# Patient Record
Sex: Male | Born: 1940
Health system: Southern US, Community
[De-identification: ages and names within clinical notes are randomized; demographics above are authoritative.]

## PROBLEM LIST (undated history)

## (undated) DIAGNOSIS — F410 Panic disorder [episodic paroxysmal anxiety] without agoraphobia: Secondary | ICD-10-CM

## (undated) DIAGNOSIS — N400 Enlarged prostate without lower urinary tract symptoms: Secondary | ICD-10-CM

## (undated) DIAGNOSIS — M109 Gout, unspecified: Secondary | ICD-10-CM

## (undated) DIAGNOSIS — G4733 Obstructive sleep apnea (adult) (pediatric): Secondary | ICD-10-CM

## (undated) DIAGNOSIS — G4700 Insomnia, unspecified: Secondary | ICD-10-CM

## (undated) DIAGNOSIS — I10 Essential (primary) hypertension: Secondary | ICD-10-CM

## (undated) DIAGNOSIS — E785 Hyperlipidemia, unspecified: Secondary | ICD-10-CM

## (undated) HISTORY — DX: Insomnia, unspecified: G47.00

## (undated) HISTORY — DX: Hyperlipidemia, unspecified: E78.5

## (undated) HISTORY — DX: Obstructive sleep apnea (adult) (pediatric): G47.33

## (undated) HISTORY — PX: BACK SURGERY: SHX140

## (undated) HISTORY — PX: EYE SURGERY: SHX253

## (undated) HISTORY — DX: Essential (primary) hypertension: I10

## (undated) HISTORY — DX: Benign prostatic hyperplasia without lower urinary tract symptoms: N40.0

## (undated) HISTORY — DX: Panic disorder (episodic paroxysmal anxiety): F41.0

## (undated) HISTORY — DX: Gout, unspecified: M10.9

---

## 1997-11-20 ENCOUNTER — Encounter: Admission: RE | Admit: 1997-11-20 | Discharge: 1997-11-20 | Payer: Self-pay | Admitting: Family Medicine

## 1997-11-24 ENCOUNTER — Encounter: Admission: RE | Admit: 1997-11-24 | Discharge: 1997-11-24 | Payer: Self-pay | Admitting: Family Medicine

## 2000-05-26 ENCOUNTER — Encounter: Admission: RE | Admit: 2000-05-26 | Discharge: 2000-05-26 | Payer: Self-pay | Admitting: Otolaryngology

## 2000-05-26 ENCOUNTER — Encounter: Payer: Self-pay | Admitting: Otolaryngology

## 2000-11-30 ENCOUNTER — Encounter: Admission: RE | Admit: 2000-11-30 | Discharge: 2000-11-30 | Payer: Self-pay | Admitting: Family Medicine

## 2000-11-30 ENCOUNTER — Encounter: Admission: RE | Admit: 2000-11-30 | Discharge: 2000-11-30 | Payer: Self-pay | Admitting: Sports Medicine

## 2000-11-30 ENCOUNTER — Encounter: Payer: Self-pay | Admitting: Sports Medicine

## 2000-12-12 ENCOUNTER — Encounter: Admission: RE | Admit: 2000-12-12 | Discharge: 2000-12-12 | Payer: Self-pay | Admitting: Family Medicine

## 2001-01-22 ENCOUNTER — Emergency Department (HOSPITAL_COMMUNITY): Admission: EM | Admit: 2001-01-22 | Discharge: 2001-01-22 | Payer: Self-pay

## 2001-01-25 ENCOUNTER — Encounter: Admission: RE | Admit: 2001-01-25 | Discharge: 2001-01-25 | Payer: Self-pay | Admitting: Family Medicine

## 2001-01-25 ENCOUNTER — Encounter: Payer: Self-pay | Admitting: Sports Medicine

## 2001-01-25 ENCOUNTER — Encounter: Admission: RE | Admit: 2001-01-25 | Discharge: 2001-01-25 | Payer: Self-pay | Admitting: Sports Medicine

## 2001-01-29 ENCOUNTER — Encounter: Admission: RE | Admit: 2001-01-29 | Discharge: 2001-01-29 | Payer: Self-pay | Admitting: Sports Medicine

## 2001-12-10 ENCOUNTER — Encounter: Admission: RE | Admit: 2001-12-10 | Discharge: 2001-12-10 | Payer: Self-pay | Admitting: Family Medicine

## 2001-12-13 ENCOUNTER — Encounter: Admission: RE | Admit: 2001-12-13 | Discharge: 2001-12-13 | Payer: Self-pay | Admitting: Sports Medicine

## 2002-01-15 ENCOUNTER — Ambulatory Visit (HOSPITAL_COMMUNITY): Admission: RE | Admit: 2002-01-15 | Discharge: 2002-01-15 | Payer: Self-pay | Admitting: Sports Medicine

## 2002-06-05 ENCOUNTER — Encounter: Admission: RE | Admit: 2002-06-05 | Discharge: 2002-06-05 | Payer: Self-pay | Admitting: Sports Medicine

## 2002-06-06 ENCOUNTER — Encounter: Admission: RE | Admit: 2002-06-06 | Discharge: 2002-06-06 | Payer: Self-pay | Admitting: Sports Medicine

## 2003-01-22 ENCOUNTER — Encounter: Admission: RE | Admit: 2003-01-22 | Discharge: 2003-01-22 | Payer: Self-pay | Admitting: Family Medicine

## 2003-01-23 ENCOUNTER — Encounter: Admission: RE | Admit: 2003-01-23 | Discharge: 2003-01-23 | Payer: Self-pay | Admitting: Sports Medicine

## 2003-06-27 ENCOUNTER — Encounter: Admission: RE | Admit: 2003-06-27 | Discharge: 2003-06-27 | Payer: Self-pay | Admitting: Family Medicine

## 2004-06-03 ENCOUNTER — Ambulatory Visit: Payer: Self-pay | Admitting: Family Medicine

## 2004-06-10 ENCOUNTER — Ambulatory Visit: Payer: Self-pay | Admitting: Sports Medicine

## 2004-07-05 ENCOUNTER — Ambulatory Visit: Payer: Self-pay | Admitting: Sports Medicine

## 2004-08-30 ENCOUNTER — Ambulatory Visit: Payer: Self-pay | Admitting: Family Medicine

## 2005-05-26 ENCOUNTER — Ambulatory Visit: Payer: Self-pay | Admitting: Sports Medicine

## 2005-06-09 ENCOUNTER — Ambulatory Visit: Payer: Self-pay | Admitting: Sports Medicine

## 2006-06-02 ENCOUNTER — Ambulatory Visit: Payer: Self-pay | Admitting: Family Medicine

## 2006-06-08 ENCOUNTER — Ambulatory Visit: Payer: Self-pay | Admitting: Sports Medicine

## 2006-07-25 ENCOUNTER — Ambulatory Visit (HOSPITAL_COMMUNITY): Admission: RE | Admit: 2006-07-25 | Discharge: 2006-07-25 | Payer: Self-pay | Admitting: Sports Medicine

## 2006-07-25 ENCOUNTER — Ambulatory Visit: Payer: Self-pay | Admitting: Sports Medicine

## 2006-10-12 DIAGNOSIS — N400 Enlarged prostate without lower urinary tract symptoms: Secondary | ICD-10-CM | POA: Insufficient documentation

## 2006-10-12 DIAGNOSIS — E785 Hyperlipidemia, unspecified: Secondary | ICD-10-CM | POA: Insufficient documentation

## 2006-10-12 DIAGNOSIS — I10 Essential (primary) hypertension: Secondary | ICD-10-CM | POA: Insufficient documentation

## 2007-02-26 ENCOUNTER — Telehealth: Payer: Self-pay | Admitting: *Deleted

## 2007-02-28 ENCOUNTER — Encounter: Admission: RE | Admit: 2007-02-28 | Discharge: 2007-02-28 | Payer: Self-pay | Admitting: Sports Medicine

## 2007-06-18 ENCOUNTER — Telehealth: Payer: Self-pay | Admitting: *Deleted

## 2007-07-05 ENCOUNTER — Encounter: Payer: Self-pay | Admitting: Sports Medicine

## 2007-07-05 ENCOUNTER — Ambulatory Visit: Payer: Self-pay | Admitting: Family Medicine

## 2007-07-05 DIAGNOSIS — N400 Enlarged prostate without lower urinary tract symptoms: Secondary | ICD-10-CM

## 2007-07-05 LAB — CONVERTED CEMR LAB

## 2007-07-07 LAB — CONVERTED CEMR LAB
ALT: 16 units/L (ref 0–53)
AST: 15 units/L (ref 0–37)
Albumin: 4.3 g/dL (ref 3.5–5.2)
Alkaline Phosphatase: 77 units/L (ref 39–117)
BUN: 14 mg/dL (ref 6–23)
CO2: 25 meq/L (ref 19–32)
Calcium: 9.1 mg/dL (ref 8.4–10.5)
Chloride: 103 meq/L (ref 96–112)
Cholesterol: 185 mg/dL (ref 0–200)
Creatinine, Ser: 1.06 mg/dL (ref 0.40–1.50)
Glucose, Bld: 94 mg/dL (ref 70–99)
HCT: 47.8 % (ref 39.0–52.0)
HDL: 31 mg/dL — ABNORMAL LOW (ref >39)
Hemoglobin: 16.4 g/dL (ref 13.0–17.0)
LDL Cholesterol: 97 mg/dL (ref 0–99)
MCHC: 34.3 g/dL (ref 30.0–36.0)
MCV: 90.7 fL (ref 78.0–100.0)
PSA: 0.92 ng/mL (ref 0.10–4.00)
Platelets: 166 10*3/uL (ref 150–400)
Potassium: 4.6 meq/L (ref 3.5–5.3)
RBC: 5.27 M/uL (ref 4.22–5.81)
RDW: 13 % (ref 11.5–15.5)
Sodium: 140 meq/L (ref 135–145)
Total Bilirubin: 0.7 mg/dL (ref 0.3–1.2)
Total CHOL/HDL Ratio: 6
Total Protein: 6.9 g/dL (ref 6.0–8.3)
Triglycerides: 286 mg/dL — ABNORMAL HIGH (ref <150)
VLDL: 57 mg/dL — ABNORMAL HIGH (ref 0–40)
WBC: 6 10*3/uL (ref 4.0–10.5)

## 2007-07-19 ENCOUNTER — Encounter: Payer: Self-pay | Admitting: *Deleted

## 2007-07-19 ENCOUNTER — Ambulatory Visit: Payer: Self-pay | Admitting: Sports Medicine

## 2007-07-19 DIAGNOSIS — J209 Acute bronchitis, unspecified: Secondary | ICD-10-CM | POA: Insufficient documentation

## 2007-07-19 DIAGNOSIS — K589 Irritable bowel syndrome without diarrhea: Secondary | ICD-10-CM | POA: Insufficient documentation

## 2007-08-23 ENCOUNTER — Ambulatory Visit: Payer: Self-pay | Admitting: Internal Medicine

## 2007-08-31 ENCOUNTER — Encounter: Payer: Self-pay | Admitting: Sports Medicine

## 2007-08-31 ENCOUNTER — Ambulatory Visit: Payer: Self-pay | Admitting: Internal Medicine

## 2007-08-31 ENCOUNTER — Encounter: Payer: Self-pay | Admitting: Internal Medicine

## 2007-10-10 ENCOUNTER — Ambulatory Visit: Payer: Self-pay | Admitting: Internal Medicine

## 2007-10-10 LAB — CONVERTED CEMR LAB
Sed Rate: 6 mm/hr (ref 0–20)
TSH: 1.84 microintl units/mL (ref 0.35–5.50)

## 2008-02-19 ENCOUNTER — Encounter: Payer: Self-pay | Admitting: Sports Medicine

## 2008-03-26 ENCOUNTER — Encounter: Payer: Self-pay | Admitting: Sports Medicine

## 2008-05-22 ENCOUNTER — Encounter: Payer: Self-pay | Admitting: Family Medicine

## 2008-05-29 ENCOUNTER — Encounter: Payer: Self-pay | Admitting: Family Medicine

## 2008-05-29 ENCOUNTER — Ambulatory Visit: Payer: Self-pay | Admitting: Family Medicine

## 2008-05-30 ENCOUNTER — Telehealth: Payer: Self-pay | Admitting: Family Medicine

## 2008-06-13 ENCOUNTER — Ambulatory Visit: Payer: Self-pay | Admitting: Family Medicine

## 2008-06-13 DIAGNOSIS — G4700 Insomnia, unspecified: Secondary | ICD-10-CM | POA: Insufficient documentation

## 2008-09-02 ENCOUNTER — Telehealth: Payer: Self-pay | Admitting: *Deleted

## 2008-09-03 ENCOUNTER — Ambulatory Visit: Payer: Self-pay | Admitting: Family Medicine

## 2008-09-03 ENCOUNTER — Encounter: Payer: Self-pay | Admitting: Family Medicine

## 2008-09-03 DIAGNOSIS — M79609 Pain in unspecified limb: Secondary | ICD-10-CM

## 2008-09-05 LAB — CONVERTED CEMR LAB: Uric Acid, Serum: 8.3 mg/dL — ABNORMAL HIGH (ref 4.0–7.8)

## 2008-10-28 ENCOUNTER — Ambulatory Visit (HOSPITAL_COMMUNITY): Admission: RE | Admit: 2008-10-28 | Discharge: 2008-10-28 | Payer: Self-pay | Admitting: Family Medicine

## 2009-02-11 ENCOUNTER — Encounter: Payer: Self-pay | Admitting: Family Medicine

## 2009-04-14 ENCOUNTER — Ambulatory Visit: Payer: Self-pay | Admitting: Cardiology

## 2009-04-14 DIAGNOSIS — E783 Hyperchylomicronemia: Secondary | ICD-10-CM

## 2009-04-14 DIAGNOSIS — R0602 Shortness of breath: Secondary | ICD-10-CM

## 2009-04-24 ENCOUNTER — Encounter: Payer: Self-pay | Admitting: Cardiology

## 2009-04-24 ENCOUNTER — Ambulatory Visit: Payer: Self-pay

## 2009-06-01 ENCOUNTER — Encounter: Payer: Self-pay | Admitting: Pulmonary Disease

## 2009-07-29 ENCOUNTER — Telehealth: Payer: Self-pay | Admitting: Pulmonary Disease

## 2009-07-29 ENCOUNTER — Ambulatory Visit: Payer: Self-pay | Admitting: Pulmonary Disease

## 2009-07-29 DIAGNOSIS — G4730 Sleep apnea, unspecified: Secondary | ICD-10-CM | POA: Insufficient documentation

## 2009-07-31 ENCOUNTER — Ambulatory Visit: Payer: Self-pay | Admitting: Pulmonary Disease

## 2009-07-31 LAB — CONVERTED CEMR LAB
Chloride: 102 meq/L (ref 96–112)
GFR calc non Af Amer: 88.89 mL/min (ref >60)
Potassium: 4.6 meq/L (ref 3.5–5.1)
Sodium: 139 meq/L (ref 135–145)

## 2009-08-06 ENCOUNTER — Ambulatory Visit: Payer: Self-pay | Admitting: Cardiology

## 2009-08-06 ENCOUNTER — Telehealth: Payer: Self-pay | Admitting: Pulmonary Disease

## 2010-12-07 ENCOUNTER — Encounter: Payer: Self-pay | Admitting: *Deleted

## 2010-12-28 NOTE — Assessment & Plan Note (Signed)
[facility]                         GASTROENTEROLOGY OFFICE NOTE   NAME:Saar, FERRIS FIELDEN                     MRN:          [id number]  DATE:10/10/2007                            DOB:          01/03/1941    NEW PATIENT EVALUATION:  Mr. Feasel is a 70 year old gentleman, a  patient of Dr. Darrick Penna.  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  He was referred by Dr. Darrick Penna initially for screening  colonoscopy, which was done on August 31, 2007, and showed two  diminutive polyps of the left colon and mild diverticulosis of the left  colon.  After the procedure, his wife's mentioned to me symptoms that  have bothered Mr. Plitt for the past ten years, mostly diarrhea and  urgency.  The attacks of crampy epigastric abdominal pain, which results  in three to four loose bowel movements, occur two to three times a week.  It has kept him out of going out, sometimes interferes with his work as  a Teacher, early years/pre in [place].  He retired as a Teacher, early years/pre, full-  time, two years ago, but his diarrhea and GI problems did not subside.  His eating habits are regular.  He usually skips breakfast, eats lunch  at a restaurant and supper at home.  His weight has gradually crept up  to being about 15 pounds up to 20 pounds overweight.  He is never  constipated.  He denies any rectal bleeding and on colonoscopy, there  was no evidence of colitis.  There is no family history of colon cancer.   MEDICATIONS:  1. Lisinopril 10 mg p.o. daily.  2. Aspirin 81 mg p.o. daily.   PAST HISTORY:  Significant for high blood pressure, hyperlipidemia,  kidney stones.  Back surgery 13 years ago.   FAMILY HISTORY:  Positive for heart disease in his father.   SOCIAL HISTORY:  Married with two children, works as a Teacher, early years/pre.  He  does not smoke and never drinks alcohol.   REVIEW OF SYSTEMS:  Positive for recent bronchitis, eyeglasses, changes  of urination with increased  frequency, some sleeping problems.   PHYSICAL EXAM:  Blood pressure 140/90, pulse 82 and weight 199 pounds.  He was alert, oriented, in no distress.  Sclera was anicteric.  Face was slightly flushed.  NECK:  Supple.  Carotids were clear.  LUNGS:  Clear to auscultation.  COR:  Normal S1, normal S2.  ABDOMEN:  Soft, relaxed with normoactive bowel sounds.  There were no  abnormal rushes, no tenderness.  Liver edge at costal margin.  Lower  abdomen was normal.  RECTAL EXAM:  Not done since patient had a recent colonoscopy.  EXTREMITIES:  No edema.   IMPRESSION:  Patient is a 70 year old white male with chronic  intermittent diarrhea, without alternating constipation.  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  There are no signs of  failure to thrive, such as weight-loss, bleeding or pain.  His symptoms  have been stable, though somewhat accelerated in the last two years.  He  denies excessive stress and his eating habits are reasonably good.  The  only consideration, other than irritable bowel syndrome, would be celiac  sprue or possible hyperthyroidism, although he might have had his TSH  checked already by Dr. Darrick Penna.  Another very unusual diagnosis would be  carcinoid or some basal active peptide disease.   PLAN:  I have discussed these diagnostic differential with Mr. Esco.  I think I would first rule out metabolic causes of his diarrhea and, at  the same time, start him on IBS therapy.  If the symptoms deteriorate or  if the tests are abnormal, we will proceed with further evaluation, such  as small bowel follow-through, possibly 24-hour urine for HIAA.   PLAN:  1. TSH and tissue transglutaminase levels today.  2. Begin Levbid 0.375 mg on the morning of his work days.  May      increase it to two a day.  3. Levsin sublingually 0.125 mg p.r.n., only this could be used in      conjunction with long-acting Levbid.  4. Begin Benefiber 3.5 g daily.    Patient will let us know about the effectiveness of this regimen.     Hedwig Morton. Juanda Chance, MD  Electronically Signed    DMB/MedQ  DD: 10/10/2007  DT: 10/10/2007  Job #: [id number]   cc:   Sibyl Parr. Darrick Penna, M.D.

## 2011-02-28 ENCOUNTER — Encounter: Payer: Self-pay | Admitting: Family Medicine

## 2011-02-28 DIAGNOSIS — I10 Essential (primary) hypertension: Secondary | ICD-10-CM | POA: Insufficient documentation

## 2011-06-03 DIAGNOSIS — H35379 Puckering of macula, unspecified eye: Secondary | ICD-10-CM | POA: Insufficient documentation

## 2011-06-03 DIAGNOSIS — H4720 Unspecified optic atrophy: Secondary | ICD-10-CM | POA: Insufficient documentation

## 2011-06-03 DIAGNOSIS — H5340 Unspecified visual field defects: Secondary | ICD-10-CM | POA: Insufficient documentation

## 2011-07-28 DIAGNOSIS — H2511 Age-related nuclear cataract, right eye: Secondary | ICD-10-CM | POA: Insufficient documentation

## 2011-07-28 DIAGNOSIS — B309 Viral conjunctivitis, unspecified: Secondary | ICD-10-CM | POA: Insufficient documentation

## 2011-07-28 DIAGNOSIS — Z961 Presence of intraocular lens: Secondary | ICD-10-CM | POA: Insufficient documentation

## 2011-07-28 DIAGNOSIS — H35372 Puckering of macula, left eye: Secondary | ICD-10-CM | POA: Insufficient documentation

## 2012-08-22 ENCOUNTER — Encounter: Payer: Self-pay | Admitting: Internal Medicine

## 2012-12-13 ENCOUNTER — Other Ambulatory Visit (INDEPENDENT_AMBULATORY_CARE_PROVIDER_SITE_OTHER): Payer: Medicare Other

## 2012-12-13 DIAGNOSIS — M109 Gout, unspecified: Secondary | ICD-10-CM

## 2012-12-13 DIAGNOSIS — Z0000 Encounter for general adult medical examination without abnormal findings: Secondary | ICD-10-CM

## 2012-12-27 ENCOUNTER — Other Ambulatory Visit: Payer: Self-pay | Admitting: Family Medicine

## 2012-12-27 MED ORDER — ALLOPURINOL 100 MG PO TABS: ORAL_TABLET | ORAL | Status: DC

## 2012-12-27 NOTE — Telephone Encounter (Signed)
Rx Refilled  

## 2013-04-10 ENCOUNTER — Encounter: Payer: Self-pay | Admitting: Internal Medicine

## 2013-07-26 ENCOUNTER — Telehealth: Payer: Self-pay | Admitting: *Deleted

## 2013-07-26 MED ORDER — ALLOPURINOL 100 MG PO TABS: ORAL_TABLET | ORAL | Status: DC

## 2013-07-26 NOTE — Telephone Encounter (Signed)
Meds refilled.

## 2013-08-05 ENCOUNTER — Encounter: Payer: Self-pay | Admitting: Physician Assistant

## 2013-08-05 ENCOUNTER — Ambulatory Visit
Admission: RE | Admit: 2013-08-05 | Discharge: 2013-08-05 | Disposition: A | Discharge status: Home / Self Care | Payer: Medicare Other | Source: Ambulatory Visit | Attending: Physician Assistant | Admitting: Physician Assistant

## 2013-08-05 ENCOUNTER — Ambulatory Visit (INDEPENDENT_AMBULATORY_CARE_PROVIDER_SITE_OTHER): Payer: Medicare Other | Admitting: Physician Assistant

## 2013-08-05 VITALS — BP 130/86 | HR 112 | Temp 102.4°F | Resp 20 | Wt 194.0 lb

## 2013-08-05 DIAGNOSIS — J22 Unspecified acute lower respiratory infection: Secondary | ICD-10-CM

## 2013-08-05 DIAGNOSIS — J988 Other specified respiratory disorders: Secondary | ICD-10-CM

## 2013-08-05 MED ORDER — HYDROCOD POLST-CHLORPHEN POLST 10-8 MG/5ML PO LQCR: 5.0000 mL | Freq: Two times a day (BID) | ORAL | Status: DC | PRN

## 2013-08-05 MED ORDER — LEVOFLOXACIN 750 MG PO TABS: 750.0000 mg | ORAL_TABLET | Freq: Every day | ORAL | Status: DC

## 2013-08-06 NOTE — Progress Notes (Signed)
Patient ID: John Evans MRN: [id number], DOB: August 01, 1941, 72 y.o. Date of Encounter: 08/06/2013, 2:36 PM    Chief Complaint:  Chief Complaint  Patient presents with  . fever, barky cough    has had long time  work gave him Zpak and he improved  now has gotten much worse.     HPI: 72 y.o. year old white male is here with his wife. 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 His son runs [employer]. He reports that his symptoms started 10 days ago. He was treated with a Z-Pak which he completed 3 days ago. Since completing the Z-Pak, he says that he is a lot worse. He and his wife both report that he currently is at his  worst now--compared to how he has been over the past 10 days. He has a really deep cough. States that he never smoked. Been unaware of any fevers or chills. However his temperature now at the office is 102.4.   He has had minimal head and nasal congestion/mucus. No significant sore throat and no ear ache.     Home Meds: See attached medication section for any medications that were entered at today's visit. The computer does not put those onto this list.The following list is a list of meds entered prior to today's visit.   Current Outpatient Prescriptions on File Prior to Visit  Medication Sig Dispense Refill  . allopurinol (ZYLOPRIM) 100 MG tablet 2 tab po qd  60 tablet  0  . aspirin 81 MG tablet Take 81 mg by mouth daily.        . calcium-vitamin D (OSCAL) 250-125 MG-UNIT per tablet Take 1 tablet by mouth daily.        Marland Kitchen lisinopril (PRINIVIL,ZESTRIL) 10 MG tablet Take 10 mg by mouth daily.        . bromfenac (XIBROM) 0.09 % ophthalmic solution 1 drop two times a day each eye       . dutasteride (AVODART) 0.5 MG capsule Take 0.5 mg by mouth daily.        . fish oil-omega-3 fatty acids 1000 MG capsule Take 2 g by mouth daily.        . hydrochlorothiazide (,MICROZIDE/HYDRODIURIL,) 12.5 MG capsule Take 12.5 mg by mouth  daily.        Marland Kitchen zolpidem (AMBIEN CR) 12.5 MG CR tablet Take 12.5 mg by mouth at bedtime as needed.         No current facility-administered medications on file prior to visit.    Allergies:  Allergies  Allergen Reactions  . Latex       Review of Systems: See HPI for pertinent ROS. All other ROS negative.    Physical Exam: Blood pressure 130/86, pulse 112, temperature 102.4 F (39.1 C), temperature source Oral, resp. rate 20, weight 194 lb (87.998 kg)., Body mass index is 27.07 kg/(m^2). General:  WNWD WM. Appears in no acute distress. HEENT: Normocephalic, atraumatic, eyes without discharge, sclera non-icteric, nares are without discharge. Bilateral auditory canals clear, TM's are without perforation, pearly grey and translucent with reflective cone of light bilaterally. Oral cavity moist, posterior pharynx without exudate, erythema, peritonsillar abscess.  Neck: Supple. No thyromegaly. No lymphadenopathy. Lungs: Clear bilaterally to auscultation without wheezes, rales, or rhonchi. Breathing is unlabored. Heart: Regular rhythm. No murmurs, rubs, or gallops. Msk:  Strength and tone normal for age. Extremities/Skin: Warm and dry. No clubbing or cyanosis. No edema. No  rashes or suspicious lesions. Neuro: Alert and oriented X 3. Moves all extremities spontaneously. Gait is normal. CNII-XII grossly in tact. Psych:  Responds to questions appropriately with a normal affect.     ASSESSMENT AND PLAN:  72 y.o. year old male with  1. Lower respiratory tract infection Given his fever of 102.4 and the fact that he has only taken a Z-Pak, we'll go ahead and obtain x-ray. However, on exam his lungs sound clear. Will go Ahead and treat with Levaquin. He is requesting an antitussive. Will give Tussionex so he can get some sleep and rest from cough. I recommended he use Mucinex DM during the day as an expectorant. Follow up if symptoms do not resolve after completion of Levaquin. As well, we'll go  ahead and followup with him once we get the x-ray report. - levofloxacin (LEVAQUIN) 750 MG tablet; Take 1 tablet (750 mg total) by mouth daily.  Dispense: 10 tablet; Refill: 0 - DG Chest 2 View; Future - chlorpheniramine-HYDROcodone (TUSSIONEX) 10-8 MG/5ML LQCR; Take 5 mLs by mouth every 12 (twelve) hours as needed for cough.  Dispense: 115 mL; Refill: 0   Signed, [address] [place], [place], [facility] 08/06/2013 2:36 PM

## 2013-09-02 ENCOUNTER — Other Ambulatory Visit: Payer: Medicare Other

## 2013-09-02 DIAGNOSIS — Z125 Encounter for screening for malignant neoplasm of prostate: Secondary | ICD-10-CM

## 2013-09-02 DIAGNOSIS — E790 Hyperuricemia without signs of inflammatory arthritis and tophaceous disease: Secondary | ICD-10-CM

## 2013-09-02 DIAGNOSIS — Z79899 Other long term (current) drug therapy: Secondary | ICD-10-CM

## 2013-09-02 DIAGNOSIS — E785 Hyperlipidemia, unspecified: Secondary | ICD-10-CM

## 2013-09-02 LAB — CBC WITH DIFFERENTIAL/PLATELET
BASOS ABS: 0 10*3/uL (ref 0.0–0.1)
Basophils Relative: 1 % (ref 0–1)
EOS ABS: 0.2 10*3/uL (ref 0.0–0.7)
Eosinophils Relative: 3 % (ref 0–5)
HCT: 46.6 % (ref 39.0–52.0)
Hemoglobin: 16.4 g/dL (ref 13.0–17.0)
LYMPHS ABS: 1.8 10*3/uL (ref 0.7–4.0)
LYMPHS PCT: 30 % (ref 12–46)
MCH: 31.9 pg (ref 26.0–34.0)
MCHC: 35.2 g/dL (ref 30.0–36.0)
MCV: 90.7 fL (ref 78.0–100.0)
Monocytes Absolute: 0.6 10*3/uL (ref 0.1–1.0)
Monocytes Relative: 10 % (ref 3–12)
NEUTROS PCT: 56 % (ref 43–77)
Neutro Abs: 3.3 10*3/uL (ref 1.7–7.7)
PLATELETS: 180 10*3/uL (ref 150–400)
RBC: 5.14 MIL/uL (ref 4.22–5.81)
RDW: 13.8 % (ref 11.5–15.5)
WBC: 5.8 10*3/uL (ref 4.0–10.5)

## 2013-09-02 LAB — COMPREHENSIVE METABOLIC PANEL
ALT: 17 U/L (ref 0–53)
AST: 18 U/L (ref 0–37)
Albumin: 4 g/dL (ref 3.5–5.2)
Alkaline Phosphatase: 74 U/L (ref 39–117)
BILIRUBIN TOTAL: 0.6 mg/dL (ref 0.3–1.2)
BUN: 19 mg/dL (ref 6–23)
CO2: 24 meq/L (ref 19–32)
CREATININE: 0.9 mg/dL (ref 0.50–1.35)
Calcium: 9.3 mg/dL (ref 8.4–10.5)
Chloride: 106 mEq/L (ref 96–112)
Glucose, Bld: 87 mg/dL (ref 70–99)
Potassium: 4.3 mEq/L (ref 3.5–5.3)
SODIUM: 142 meq/L (ref 135–145)
TOTAL PROTEIN: 6.7 g/dL (ref 6.0–8.3)

## 2013-09-02 LAB — PSA, MEDICARE: PSA: 4.56 ng/mL — ABNORMAL HIGH (ref <=4.00)

## 2013-09-02 LAB — LIPID PANEL
CHOL/HDL RATIO: 5.6 ratio
CHOLESTEROL: 203 mg/dL — AB (ref 0–200)
HDL: 36 mg/dL — ABNORMAL LOW (ref >39)
LDL CALC: 114 mg/dL — AB (ref 0–99)
Triglycerides: 263 mg/dL — ABNORMAL HIGH (ref <150)
VLDL: 53 mg/dL — AB (ref 0–40)

## 2013-09-02 LAB — URIC ACID: Uric Acid, Serum: 6.3 mg/dL (ref 4.0–7.8)

## 2013-09-05 ENCOUNTER — Encounter: Payer: Self-pay | Admitting: Family Medicine

## 2013-09-05 ENCOUNTER — Ambulatory Visit (INDEPENDENT_AMBULATORY_CARE_PROVIDER_SITE_OTHER): Payer: Medicare Other | Admitting: Family Medicine

## 2013-09-05 VITALS — BP 110/78 | HR 86 | Temp 97.2°F | Resp 16 | Ht 71.0 in | Wt 191.0 lb

## 2013-09-05 DIAGNOSIS — G609 Hereditary and idiopathic neuropathy, unspecified: Secondary | ICD-10-CM

## 2013-09-05 DIAGNOSIS — Z0000 Encounter for general adult medical examination without abnormal findings: Secondary | ICD-10-CM

## 2013-09-05 DIAGNOSIS — Z23 Encounter for immunization: Secondary | ICD-10-CM

## 2013-09-05 DIAGNOSIS — N419 Inflammatory disease of prostate, unspecified: Secondary | ICD-10-CM

## 2013-09-05 MED ORDER — ROSUVASTATIN CALCIUM 5 MG PO TABS: 5.0000 mg | ORAL_TABLET | Freq: Every day | ORAL | Status: DC

## 2013-09-05 MED ORDER — LISINOPRIL 10 MG PO TABS: 10.0000 mg | ORAL_TABLET | Freq: Every day | ORAL | Status: DC

## 2013-09-05 MED ORDER — CIPROFLOXACIN HCL 500 MG PO TABS: 500.0000 mg | ORAL_TABLET | Freq: Two times a day (BID) | ORAL | Status: DC

## 2013-09-06 ENCOUNTER — Encounter: Payer: Self-pay | Admitting: Family Medicine

## 2013-09-06 DIAGNOSIS — N400 Enlarged prostate without lower urinary tract symptoms: Secondary | ICD-10-CM | POA: Insufficient documentation

## 2013-09-06 NOTE — Progress Notes (Signed)
Subjective:    Patient ID: John Evans, male    DOB: 30-Aug-1940, 73 y.o.   MRN: [id number]  HPI  Patient is here today for complete physical exam.  He has a significant past medical history of BPH. His last PSA was 1.7. In the interval time the patient has discontinued Avodart.  Symptomatically he has not witnessed any change in his urine flow.  However his PSA has risen substantially to 4.5.  This is out of portion to the increase I would expect from discontinuing Avodart.  Otherwise he is doing well. His last colonoscopy is up-to-date he was in 2008. He has had Pneumovax. He has also back. He has had his annual flu shot. He has no other medical concerns today on examination.  He does report burning pins and needles pain in both of his feet primarily at night. Past Medical History  Diagnosis Date  . Hypertension   . Panic attacks   . Hyperlipidemia   . Obstructive sleep apnea   . Insomnia   . BPH (benign prostatic hyperplasia)    Past Surgical History  Procedure Laterality Date  . Eye surgery     Current Outpatient Prescriptions on File Prior to Visit  Medication Sig Dispense Refill  . allopurinol (ZYLOPRIM) 100 MG tablet 2 tab po qd  60 tablet  0  . aspirin 81 MG tablet Take 81 mg by mouth daily.         No current facility-administered medications on file prior to visit.   Allergies  Allergen Reactions  . Latex    History   Social History  . Marital Status: Married    Spouse Name: N/A    Number of Children: N/A  . Years of Education: N/A   Occupational History  . Not on file.   Social History Main Topics  . Smoking status: Never Smoker   . Smokeless tobacco: Never Used  . Alcohol Use: No  . Drug Use: No  . Sexual Activity: Yes     Comment: married, retired Software engineer   Other Topics Concern  . Not on file   Social History Narrative  . No narrative on file     Review of Systems  All other systems reviewed and are negative.       Objective:   Physical Exam  Vitals reviewed. Constitutional: He is oriented to person, place, and time. He appears well-developed and well-nourished. No distress.  HENT:  Head: Normocephalic and atraumatic.  Right Ear: External ear normal.  Left Ear: External ear normal.  Nose: Nose normal.  Mouth/Throat: Oropharynx is clear and moist. No oropharyngeal exudate.  Eyes: Conjunctivae and EOM are normal. Pupils are equal, round, and reactive to light. Right eye exhibits no discharge. Left eye exhibits no discharge. No scleral icterus.  Neck: Normal range of motion. Neck supple. No JVD present. No tracheal deviation present. No thyromegaly present.  Cardiovascular: Normal rate, regular rhythm, normal heart sounds and intact distal pulses.  Exam reveals no gallop and no friction rub.   No murmur heard. Pulmonary/Chest: Effort normal and breath sounds normal. No stridor. No respiratory distress. He has no wheezes. He has no rales. He exhibits no tenderness.  Abdominal: Soft. Bowel sounds are normal. He exhibits no distension and no mass. There is no tenderness. There is no rebound and no guarding.  Genitourinary: Rectum normal.  Musculoskeletal: Normal range of motion. He exhibits no edema and no tenderness.  Lymphadenopathy:    He has no cervical adenopathy.  Neurological: He is alert and oriented to person, place, and time. He has normal reflexes. He displays normal reflexes. No cranial nerve deficit. He exhibits normal muscle tone. Coordination normal.  Skin: Skin is warm. No rash noted. He is not diaphoretic. No erythema. No pallor.  Psychiatric: He has a normal mood and affect. His behavior is normal. Judgment and thought content normal.   prostate is +3 in size, tender to the touch, is smooth and symmetric without nodularity.        Assessment & Plan:  1. Need for prophylactic vaccination against Streptococcus pneumoniae (pneumococcus) - Pneumococcal conjugate vaccine 13-valent IM  2. Routine  general medical examination at a health care facility Patient's most recent labwork is listed: Lab on 09/02/2013  Component Date Value Range Status  . WBC 09/02/2013 5.8  4.0 - 10.5 K/uL Final  . RBC 09/02/2013 5.14  4.22 - 5.81 MIL/uL Final  . Hemoglobin 09/02/2013 16.4  13.0 - 17.0 g/dL Final  . HCT 09/02/2013 46.6  39.0 - 52.0 % Final  . MCV 09/02/2013 90.7  78.0 - 100.0 fL Final  . MCH 09/02/2013 31.9  26.0 - 34.0 pg Final  . MCHC 09/02/2013 35.2  30.0 - 36.0 g/dL Final  . RDW 09/02/2013 13.8  11.5 - 15.5 % Final  . Platelets 09/02/2013 180  150 - 400 K/uL Final  . Neutrophils Relative % 09/02/2013 56  43 - 77 % Final  . Neutro Abs 09/02/2013 3.3  1.7 - 7.7 K/uL Final  . Lymphocytes Relative 09/02/2013 30  12 - 46 % Final  . Lymphs Abs 09/02/2013 1.8  0.7 - 4.0 K/uL Final  . Monocytes Relative 09/02/2013 10  3 - 12 % Final  . Monocytes Absolute 09/02/2013 0.6  0.1 - 1.0 K/uL Final  . Eosinophils Relative 09/02/2013 3  0 - 5 % Final  . Eosinophils Absolute 09/02/2013 0.2  0.0 - 0.7 K/uL Final  . Basophils Relative 09/02/2013 1  0 - 1 % Final  . Basophils Absolute 09/02/2013 0.0  0.0 - 0.1 K/uL Final  . Smear Review 09/02/2013 Criteria for review not met   Final  . Sodium 09/02/2013 142  135 - 145 mEq/L Final  . Potassium 09/02/2013 4.3  3.5 - 5.3 mEq/L Final  . Chloride 09/02/2013 106  96 - 112 mEq/L Final  . CO2 09/02/2013 24  19 - 32 mEq/L Final  . Glucose, Bld 09/02/2013 87  70 - 99 mg/dL Final  . BUN 09/02/2013 19  6 - 23 mg/dL Final  . Creat 09/02/2013 0.90  0.50 - 1.35 mg/dL Final  . Total Bilirubin 09/02/2013 0.6  0.3 - 1.2 mg/dL Final  . Alkaline Phosphatase 09/02/2013 74  39 - 117 U/L Final  . AST 09/02/2013 18  0 - 37 U/L Final  . ALT 09/02/2013 17  0 - 53 U/L Final  . Total Protein 09/02/2013 6.7  6.0 - 8.3 g/dL Final  . Albumin 09/02/2013 4.0  3.5 - 5.2 g/dL Final  . Calcium 09/02/2013 9.3  8.4 - 10.5 mg/dL Final  . Cholesterol 09/02/2013 203* 0 - 200 mg/dL Final     Comment: ATP III Classification:                                < 200        mg/dL        Desirable  200 - 239     mg/dL        Borderline High                               >= 240        mg/dL        High                             . Triglycerides 09/02/2013 263* <150 mg/dL Final  . HDL 09/02/2013 36* >39 mg/dL Final  . Total CHOL/HDL Ratio 09/02/2013 5.6   Final  . VLDL 09/02/2013 53* 0 - 40 mg/dL Final  . LDL Cholesterol 09/02/2013 114* 0 - 99 mg/dL Final   Comment:                            Total Cholesterol/HDL Ratio:CHD Risk                                                 Coronary Heart Disease Risk Table                                                                 Men       Women                                   1/2 Average Risk              3.4        3.3                                       Average Risk              5.0        4.4                                    2X Average Risk              9.6        7.1                                    3X Average Risk             23.4       11.0                          Use the calculated Patient Ratio above and the CHD Risk table                           to  determine the patient's CHD Risk.                          ATP III Classification (LDL):                                < 100        mg/dL         Optimal                               100 - 129     mg/dL         Near or Above Optimal                               130 - 159     mg/dL         Borderline High                               160 - 189     mg/dL         High                                > 190        mg/dL         Very High                             . PSA 09/02/2013 4.56* <=4.00 ng/mL Final   Comment: Result repeated and verified.                          Test Methodology: ECLIA PSA (Electrochemiluminescence Immunoassay)                                                     For PSA values from 2.5-4.0, particularly in younger men  <60 years                          old, the AUA and NCCN suggest testing for % Free PSA (3515) and                          evaluation of the rate of increase in PSA (PSA velocity).  . Uric Acid, Serum 09/02/2013 6.3  4.0 - 7.8 mg/dL Final   Labs are significant for substantial rise in his PSA, and elevated triglycerides, low HDL, elevated LDL. I recommended the patient begin Crestor 5 mg by mouth daily and recheck a fasting lipid panel in 3 months. I anticipate that his PSA should have risen to approximately 3.7 3.5 after discontinuing have a dark. I am concerned by the amount of increase. I believe there may be an element of prostatitis. I therefore give the patient Cipro 500 mg by mouth twice a day for 10 days. I have asked him to return in 2 weeks  to allow [place] to recheck his PSA. If his PSA does not fall, consult urology for possible biopsy. I have also recommended the patient discontinue his allopurinol and replace it with uloric 40 mg poqday. The patient returns for lab work in 2 weeks I will also check a TSH and vitamin B12 to evaluate for other possible causes of neuropathy.

## 2013-09-19 ENCOUNTER — Other Ambulatory Visit: Payer: Medicare Other

## 2013-09-19 DIAGNOSIS — N419 Inflammatory disease of prostate, unspecified: Secondary | ICD-10-CM

## 2013-09-19 DIAGNOSIS — G609 Hereditary and idiopathic neuropathy, unspecified: Secondary | ICD-10-CM

## 2013-09-19 LAB — VITAMIN B12: Vitamin B-12: 318 pg/mL (ref 211–911)

## 2013-09-19 LAB — PSA, MEDICARE: PSA: 4.74 ng/mL — ABNORMAL HIGH (ref <=4.00)

## 2013-09-19 LAB — TSH: TSH: 2.168 u[IU]/mL (ref 0.350–4.500)

## 2013-09-24 ENCOUNTER — Other Ambulatory Visit: Payer: Self-pay | Admitting: Family Medicine

## 2013-09-24 DIAGNOSIS — R972 Elevated prostate specific antigen [PSA]: Secondary | ICD-10-CM

## 2013-10-14 DIAGNOSIS — R972 Elevated prostate specific antigen [PSA]: Secondary | ICD-10-CM | POA: Insufficient documentation

## 2013-10-14 DIAGNOSIS — N320 Bladder-neck obstruction: Secondary | ICD-10-CM | POA: Insufficient documentation

## 2013-10-14 DIAGNOSIS — R3915 Urgency of urination: Secondary | ICD-10-CM | POA: Insufficient documentation

## 2013-11-27 ENCOUNTER — Other Ambulatory Visit: Payer: Self-pay | Admitting: Family Medicine

## 2013-11-27 NOTE — Telephone Encounter (Signed)
Medication refilled per protocol. 

## 2013-12-05 ENCOUNTER — Emergency Department (HOSPITAL_COMMUNITY)
Admission: EM | Admit: 2013-12-05 | Discharge: 2013-12-05 | Disposition: A | Discharge status: Home / Self Care | Payer: Medicare Other | Attending: Emergency Medicine | Admitting: Emergency Medicine

## 2013-12-05 ENCOUNTER — Encounter (HOSPITAL_COMMUNITY): Payer: Self-pay | Admitting: Emergency Medicine

## 2013-12-05 DIAGNOSIS — R141 Gas pain: Secondary | ICD-10-CM | POA: Insufficient documentation

## 2013-12-05 DIAGNOSIS — Z8659 Personal history of other mental and behavioral disorders: Secondary | ICD-10-CM | POA: Insufficient documentation

## 2013-12-05 DIAGNOSIS — Z79899 Other long term (current) drug therapy: Secondary | ICD-10-CM | POA: Insufficient documentation

## 2013-12-05 DIAGNOSIS — Z8669 Personal history of other diseases of the nervous system and sense organs: Secondary | ICD-10-CM | POA: Insufficient documentation

## 2013-12-05 DIAGNOSIS — M549 Dorsalgia, unspecified: Secondary | ICD-10-CM | POA: Insufficient documentation

## 2013-12-05 DIAGNOSIS — I10 Essential (primary) hypertension: Secondary | ICD-10-CM | POA: Insufficient documentation

## 2013-12-05 DIAGNOSIS — Z87448 Personal history of other diseases of urinary system: Secondary | ICD-10-CM | POA: Insufficient documentation

## 2013-12-05 DIAGNOSIS — Z91040 Latex allergy status: Secondary | ICD-10-CM | POA: Insufficient documentation

## 2013-12-05 DIAGNOSIS — R143 Flatulence: Secondary | ICD-10-CM

## 2013-12-05 DIAGNOSIS — E785 Hyperlipidemia, unspecified: Secondary | ICD-10-CM | POA: Insufficient documentation

## 2013-12-05 DIAGNOSIS — R339 Retention of urine, unspecified: Secondary | ICD-10-CM | POA: Insufficient documentation

## 2013-12-05 DIAGNOSIS — R142 Eructation: Secondary | ICD-10-CM

## 2013-12-05 DIAGNOSIS — Z792 Long term (current) use of antibiotics: Secondary | ICD-10-CM | POA: Insufficient documentation

## 2013-12-05 DIAGNOSIS — Z7982 Long term (current) use of aspirin: Secondary | ICD-10-CM | POA: Insufficient documentation

## 2013-12-05 LAB — CBC WITH DIFFERENTIAL/PLATELET
Basophils Absolute: 0 10*3/uL (ref 0.0–0.1)
Basophils Relative: 0 % (ref 0–1)
Eosinophils Absolute: 0 10*3/uL (ref 0.0–0.7)
Eosinophils Relative: 0 % (ref 0–5)
HEMATOCRIT: 44.7 % (ref 39.0–52.0)
Hemoglobin: 16.1 g/dL (ref 13.0–17.0)
Lymphocytes Relative: 11 % — ABNORMAL LOW (ref 12–46)
Lymphs Abs: 1 10*3/uL (ref 0.7–4.0)
MCH: 32.7 pg (ref 26.0–34.0)
MCHC: 36 g/dL (ref 30.0–36.0)
MCV: 90.9 fL (ref 78.0–100.0)
MONO ABS: 0.5 10*3/uL (ref 0.1–1.0)
Monocytes Relative: 6 % (ref 3–12)
Neutro Abs: 7.6 10*3/uL (ref 1.7–7.7)
Neutrophils Relative %: 83 % — ABNORMAL HIGH (ref 43–77)
Platelets: 200 10*3/uL (ref 150–400)
RBC: 4.92 MIL/uL (ref 4.22–5.81)
RDW: 12.7 % (ref 11.5–15.5)
WBC: 9.1 10*3/uL (ref 4.0–10.5)

## 2013-12-05 LAB — URINALYSIS, ROUTINE W REFLEX MICROSCOPIC
BILIRUBIN URINE: NEGATIVE
GLUCOSE, UA: NEGATIVE mg/dL
KETONES UR: NEGATIVE mg/dL
Leukocytes, UA: NEGATIVE
Nitrite: NEGATIVE
PH: 6.5 (ref 5.0–8.0)
Protein, ur: NEGATIVE mg/dL
Specific Gravity, Urine: 1.007 (ref 1.005–1.030)
Urobilinogen, UA: 0.2 mg/dL (ref 0.0–1.0)

## 2013-12-05 LAB — BASIC METABOLIC PANEL
BUN: 11 mg/dL (ref 6–23)
CO2: 23 meq/L (ref 19–32)
CREATININE: 1.17 mg/dL (ref 0.50–1.35)
Calcium: 9.5 mg/dL (ref 8.4–10.5)
Chloride: 99 mEq/L (ref 96–112)
GFR calc Af Amer: 70 mL/min — ABNORMAL LOW (ref >90)
GFR calc non Af Amer: 60 mL/min — ABNORMAL LOW (ref >90)
Glucose, Bld: 132 mg/dL — ABNORMAL HIGH (ref 70–99)
Potassium: 5 mEq/L (ref 3.7–5.3)
SODIUM: 137 meq/L (ref 137–147)

## 2013-12-05 LAB — URINE MICROSCOPIC-ADD ON

## 2013-12-05 MED ORDER — TAMSULOSIN HCL 0.4 MG PO CAPS: 0.4000 mg | ORAL_CAPSULE | Freq: Every day | ORAL | Status: DC

## 2013-12-05 MED ORDER — FENTANYL CITRATE 0.05 MG/ML IJ SOLN
50.0000 ug | Freq: Once | INTRAMUSCULAR | Status: AC
  Administered 2013-12-05: 50 ug via INTRAVENOUS
  Filled 2013-12-05: qty 2

## 2013-12-05 NOTE — ED Notes (Signed)
Patient reports severe pain in groin, difficulty urinating, and around 6am this morning, experiencing pain in right back.  Reports diarrhea 4-5 times.  He reports he has had kidney stones in the past.

## 2013-12-05 NOTE — ED Notes (Signed)
MD at bedside. 

## 2013-12-05 NOTE — ED Provider Notes (Signed)
CSN: [id number]     Arrival date & time 12/05/13  9381 History   First MD Initiated Contact with Patient 12/05/13 [phone]     Chief Complaint  Patient presents with  . Flank Pain     (Consider location/radiation/quality/duration/timing/severity/associated sxs/prior Treatment) HPI Comments: Assessment presents to the ER for evaluation of flank pain, groin pain, urinary frequency and dysuria. Patient reports that he was recently diagnosed with a urinary tract infection secondary to pain in his back and urinary frequency. He completed a course of Cipro and symptoms improved. He was off the Cipro for 2 days when his symptoms started again. His doctor called in a prescription for Bactrim, but it has not helped. Now he is having worsening pain. Pain is constant, aching in the back and sharp in the front of his abdomen, groin area. He reports that he had multiple episodes of urination last night, but has not urinated since. He is feeling like his abdomen is distended.  Patient is a 73 y.o. male presenting with flank pain.  Flank Pain    Past Medical History  Diagnosis Date  . Hypertension   . Panic attacks   . Hyperlipidemia   . Obstructive sleep apnea   . Insomnia   . BPH (benign prostatic hyperplasia)    Past Surgical History  Procedure Laterality Date  . Eye surgery    . Back surgery     Family History  Problem Relation Age of Onset  . Cancer Father     throat  . Diabetes Paternal Grandmother    History  Substance Use Topics  . Smoking status: Never Smoker   . Smokeless tobacco: Never Used  . Alcohol Use: No    Review of Systems  Genitourinary: Positive for urgency, frequency and flank pain.  All other systems reviewed and are negative.     Allergies  Latex  Home Medications   Prior to Admission medications   Medication Sig Start Date End Date Taking? Authorizing Provider  allopurinol (ZYLOPRIM) 100 MG tablet TAKE TWO (2) TABLETS BY MOUTH DAILY 11/27/13   Susy Frizzle, MD  aspirin 81 MG tablet Take 81 mg by mouth daily.      Historical Provider, MD  ciprofloxacin (CIPRO) 500 MG tablet Take 1 tablet (500 mg total) by mouth 2 (two) times daily. 09/05/13   Susy Frizzle, MD  lisinopril (PRINIVIL,ZESTRIL) 10 MG tablet Take 1 tablet (10 mg total) by mouth daily. 09/05/13   Susy Frizzle, MD  rosuvastatin (CRESTOR) 5 MG tablet Take 1 tablet (5 mg total) by mouth daily. 09/05/13   Susy Frizzle, MD   BP 149/77  Pulse 59  Temp(Src) 97.5 F (36.4 C) (Oral)  Resp 20  Ht 5\' 11"  (1.803 m)  Wt 175 lb (79.379 kg)  BMI 24.42 kg/m2  SpO2 98% Physical Exam  Constitutional: He is oriented to person, place, and time. He appears well-developed and well-nourished. No distress.  HENT:  Head: Normocephalic and atraumatic.  Right Ear: Hearing normal.  Left Ear: Hearing normal.  Nose: Nose normal.  Mouth/Throat: Oropharynx is clear and moist and mucous membranes are normal.  Eyes: Conjunctivae and EOM are normal. Pupils are equal, round, and reactive to light.  Neck: Normal range of motion. Neck supple.  Cardiovascular: Regular rhythm, S1 normal and S2 normal.  Exam reveals no gallop and no friction rub.   No murmur heard. Pulmonary/Chest: Effort normal and breath sounds normal. No respiratory distress. He exhibits no tenderness.  Abdominal:  Soft. Normal appearance and bowel sounds are normal. He exhibits distension. There is no hepatosplenomegaly. There is tenderness in the suprapubic area. There is no rebound, no guarding, no tenderness at McBurney's point and negative Murphy's sign. No hernia.  Musculoskeletal: Normal range of motion.  Neurological: He is alert and oriented to person, place, and time. He has normal strength. No cranial nerve deficit or sensory deficit. Coordination normal. GCS eye subscore is 4. GCS verbal subscore is 5. GCS motor subscore is 6.  Skin: Skin is warm, dry and intact. No rash noted. No cyanosis.  Psychiatric: He has a normal  mood and affect. His speech is normal and behavior is normal. Thought content normal.    ED Course  Procedures (including critical care time) Labs Review Labs Reviewed  CBC WITH DIFFERENTIAL - Abnormal; Notable for the following:    Neutrophils Relative % 83 (*)    Lymphocytes Relative 11 (*)    All other components within normal limits  BASIC METABOLIC PANEL - Abnormal; Notable for the following:    Glucose, Bld 132 (*)    GFR calc non Af Amer 60 (*)    GFR calc Af Amer 70 (*)    All other components within normal limits  URINALYSIS, ROUTINE W REFLEX MICROSCOPIC - Abnormal; Notable for the following:    Hgb urine dipstick TRACE (*)    All other components within normal limits  URINE CULTURE  URINE MICROSCOPIC-ADD ON    Imaging Review No results found.   EKG Interpretation None      MDM   Final diagnoses:  Urinary retention   She presents to the ER for evaluation of back pain, lower abdominal and pelvic pain with decreased urine output. Patient was recently treated for urinary tract infection. He is now on Bactrim because after stopping antibiotics, he started having some symptoms of dysuria once again. His urinalysis is showing no signs of infection, culture is pending. A bladder scan was performed and showed nearly 1 L of urine in the bladder, indicating outlet obstruction. A Foley catheter was placed with resolution of his symptoms. Patient does have a history of BPH. He will be discharged with catheter in place. Continue the Bactrim as previously prescribed. He is followup with Doctor Roni Bread tomorrow.  Orpah Greek, MD 12/05/13 [phone]

## 2013-12-05 NOTE — ED Notes (Signed)
Discussed with the patient the need to void for urine sample.  Also discussed use of bladder scanner to check for retention. Patient acknowledges, and agrees with the plan of care.

## 2013-12-05 NOTE — Discharge Instructions (Signed)
Acute Urinary Retention, Male °Acute urinary retention is the temporary inability to urinate. °This is a common problem in older men. As men age their prostates become larger and block the flow of urine from the bladder. This is usually a problem that has come on gradually.  °HOME CARE INSTRUCTIONS °If you are sent home with a Foley catheter and a drainage system, you will need to discuss the best course of action with your health care provider. While the catheter is in, maintain a good intake of fluids. Keep the drainage bag emptied and lower than your catheter. This is so that contaminated urine will not flow back into your bladder, which could lead to a urinary tract infection. °There are two main types of drainage bags. One is a large bag that usually is used at night. It has a good capacity that will allow you to sleep through the night without having to empty it. The second type is called a leg bag. It has a smaller capacity, so it needs to be emptied more frequently. However, the main advantage is that it can be attached by a leg strap and can go underneath your clothing, allowing you the freedom to move about or leave your home. °Only take over-the-counter or prescription medicines for pain, discomfort, or fever as directed by your health care provider.  °SEEK MEDICAL CARE IF: °· You develop a low-grade fever. °· You experience spasms or leakage of urine with the spasms. °SEEK IMMEDIATE MEDICAL CARE IF:  °· You develop chills or fever. °· Your catheter stops draining urine. °· Your catheter falls out. °· You start to develop increased bleeding that does not respond to rest and increased fluid intake. °MAKE SURE YOU: °· Understand these instructions. °· Will watch your condition. °· Will get help right away if you are not doing well or get worse. °Document Released: 11/07/2000 Document Revised: 04/03/2013 Document Reviewed: 01/10/2013 °[employer]® Patient Information ©2014 [employer]. ° °

## 2013-12-06 LAB — URINE CULTURE
COLONY COUNT: NO GROWTH
CULTURE: NO GROWTH

## 2014-06-04 ENCOUNTER — Other Ambulatory Visit: Payer: Self-pay | Admitting: Family Medicine

## 2014-06-19 ENCOUNTER — Encounter: Payer: Self-pay | Admitting: Family Medicine

## 2014-06-19 ENCOUNTER — Ambulatory Visit (INDEPENDENT_AMBULATORY_CARE_PROVIDER_SITE_OTHER): Payer: Medicare Other | Admitting: Family Medicine

## 2014-06-19 VITALS — BP 118/68 | HR 68 | Temp 97.9°F | Resp 16 | Ht 71.0 in | Wt 186.0 lb

## 2014-06-19 DIAGNOSIS — G629 Polyneuropathy, unspecified: Secondary | ICD-10-CM

## 2014-06-19 DIAGNOSIS — B351 Tinea unguium: Secondary | ICD-10-CM

## 2014-06-19 MED ORDER — TERBINAFINE HCL 250 MG PO TABS
250.0000 mg | ORAL_TABLET | Freq: Every day | ORAL | Status: DC
Start: 2014-06-19 — End: 2015-02-11

## 2014-06-19 MED ORDER — EFINACONAZOLE 10 % EX SOLN: 1.0000 "application " | Freq: Every morning | CUTANEOUS | Status: DC

## 2014-06-19 MED ORDER — GABAPENTIN 300 MG PO CAPS: 300.0000 mg | ORAL_CAPSULE | Freq: Three times a day (TID) | ORAL | Status: DC

## 2014-06-19 NOTE — Progress Notes (Signed)
Subjective:    Patient ID: John Evans, male    DOB: 1941-04-18, 73 y.o.   MRN: [id number]  HPI The great toenail on his right foot and the second toenail are both sick dystrophic and black in color. The toenail itself is splitting with on-call lysis. This is also spreading to the fifth toenail. He is interested in treatment. He also complains of pins and needles dysesthesias in both feet primarily at night to prevent him from sleeping. There is no back pain or radiating pain coming from his back. There is no numbness or weakness in his legs. Examination today of the feet are normal except for the onychomycosis on his toenails. He has normal sensation to 10 g monofilament. He has norma normall sensation to cold and proprioception.   Past Medical History  Diagnosis Date  . Hypertension   . Panic attacks   . Hyperlipidemia   . Obstructive sleep apnea   . Insomnia   . BPH (benign prostatic hyperplasia)    Past Surgical History  Procedure Laterality Date  . Eye surgery    . Back surgery     Current Outpatient Prescriptions on File Prior to Visit  Medication Sig Dispense Refill  . allopurinol (ZYLOPRIM) 100 MG tablet Take 200 mg by mouth daily.    Marland Kitchen aspirin 81 MG tablet Take 81 mg by mouth daily.      Marland Kitchen lisinopril (PRINIVIL,ZESTRIL) 10 MG tablet Take 1 tablet (10 mg total) by mouth daily. 90 tablet 3  . rosuvastatin (CRESTOR) 5 MG tablet Take 1 tablet (5 mg total) by mouth daily. 30 tablet 3  . tamsulosin (FLOMAX) 0.4 MG CAPS capsule Take 1 capsule (0.4 mg total) by mouth daily. 30 capsule 0   No current facility-administered medications on file prior to visit.   Allergies  Allergen Reactions  . Latex Rash   History   Social History  . Marital Status: Married    Spouse Name: N/A    Number of Children: N/A  . Years of Education: N/A   Occupational History  . Not on file.   Social History Main Topics  . Smoking status: Never Smoker   . Smokeless tobacco: Never Used  .  Alcohol Use: No  . Drug Use: No  . Sexual Activity: Yes     Comment: married, retired Software engineer   Other Topics Concern  . Not on file   Social History Narrative      Review of Systems  All other systems reviewed and are negative.      Objective:   Physical Exam  Constitutional: He is oriented to person, place, and time.  Cardiovascular: Normal rate, regular rhythm and normal heart sounds.   Pulmonary/Chest: Effort normal and breath sounds normal.  Abdominal: Soft. Bowel sounds are normal.  Neurological: He is alert and oriented to person, place, and time. He has normal reflexes. He displays normal reflexes. No cranial nerve deficit. He exhibits normal muscle tone. Coordination normal.  Vitals reviewed. We see the description of the toenails in the history of present illness        Assessment & Plan:  Onychomycosis of toenail - Plan: terbinafine (LAMISIL) 250 MG tablet, Efinaconazole (JUBLIA) 10 % SOLN  Peripheral neuropathy - Plan: gabapentin (NEURONTIN) 300 MG capsule  Patient would like to try jublia.  I sent a prescription to his pharmacy. If he cannot afford that medication however, he will try Lamisil 250 mg by mouth daily. We will check liver function test every  month while he is taking the medication. Also recommended that he discontinue Crestor while he is taking the medication. I will also start the patient on gabapentin 300 mg by mouth daily at bedtime for peripheral neuropathy.

## 2014-07-23 ENCOUNTER — Other Ambulatory Visit: Payer: Medicare Other

## 2014-07-23 DIAGNOSIS — B351 Tinea unguium: Secondary | ICD-10-CM

## 2014-07-23 DIAGNOSIS — Z79899 Other long term (current) drug therapy: Secondary | ICD-10-CM

## 2014-07-23 LAB — HEPATIC FUNCTION PANEL
ALT: 10 U/L (ref 0–53)
AST: 15 U/L (ref 0–37)
Albumin: 4.2 g/dL (ref 3.5–5.2)
Alkaline Phosphatase: 98 U/L (ref 39–117)
Bilirubin, Direct: 0.1 mg/dL (ref 0.0–0.3)
Indirect Bilirubin: 0.5 mg/dL (ref 0.2–1.2)
TOTAL PROTEIN: 6.6 g/dL (ref 6.0–8.3)
Total Bilirubin: 0.6 mg/dL (ref 0.2–1.2)

## 2014-07-25 ENCOUNTER — Encounter: Payer: Self-pay | Admitting: *Deleted

## 2014-09-11 DIAGNOSIS — N401 Enlarged prostate with lower urinary tract symptoms: Secondary | ICD-10-CM | POA: Diagnosis not present

## 2014-09-11 DIAGNOSIS — R339 Retention of urine, unspecified: Secondary | ICD-10-CM | POA: Diagnosis not present

## 2014-09-11 DIAGNOSIS — R361 Hematospermia: Secondary | ICD-10-CM | POA: Diagnosis not present

## 2014-10-03 ENCOUNTER — Other Ambulatory Visit: Payer: Self-pay | Admitting: Family Medicine

## 2014-10-03 ENCOUNTER — Encounter: Payer: Self-pay | Admitting: Family Medicine

## 2014-10-03 DIAGNOSIS — L821 Other seborrheic keratosis: Secondary | ICD-10-CM | POA: Diagnosis not present

## 2014-10-03 DIAGNOSIS — L820 Inflamed seborrheic keratosis: Secondary | ICD-10-CM | POA: Diagnosis not present

## 2014-10-03 DIAGNOSIS — L570 Actinic keratosis: Secondary | ICD-10-CM | POA: Diagnosis not present

## 2014-10-03 DIAGNOSIS — D1801 Hemangioma of skin and subcutaneous tissue: Secondary | ICD-10-CM | POA: Diagnosis not present

## 2014-10-03 DIAGNOSIS — D225 Melanocytic nevi of trunk: Secondary | ICD-10-CM | POA: Diagnosis not present

## 2014-10-03 DIAGNOSIS — B351 Tinea unguium: Secondary | ICD-10-CM | POA: Diagnosis not present

## 2014-10-03 NOTE — Telephone Encounter (Signed)
Medication refill for one time only.  Patient needs to be seen.  Letter sent for patient to call and schedule 

## 2014-10-31 ENCOUNTER — Other Ambulatory Visit: Payer: Medicare Other

## 2014-10-31 DIAGNOSIS — N400 Enlarged prostate without lower urinary tract symptoms: Secondary | ICD-10-CM | POA: Diagnosis not present

## 2014-10-31 DIAGNOSIS — I10 Essential (primary) hypertension: Secondary | ICD-10-CM

## 2014-10-31 DIAGNOSIS — E783 Hyperchylomicronemia: Secondary | ICD-10-CM | POA: Diagnosis not present

## 2014-10-31 LAB — CBC WITH DIFFERENTIAL/PLATELET
Basophils Absolute: 0 10*3/uL (ref 0.0–0.1)
Basophils Relative: 0 % (ref 0–1)
Eosinophils Absolute: 0.2 10*3/uL (ref 0.0–0.7)
Eosinophils Relative: 5 % (ref 0–5)
HEMATOCRIT: 46.6 % (ref 39.0–52.0)
Hemoglobin: 16.2 g/dL (ref 13.0–17.0)
LYMPHS ABS: 1.7 10*3/uL (ref 0.7–4.0)
LYMPHS PCT: 36 % (ref 12–46)
MCH: 32 pg (ref 26.0–34.0)
MCHC: 34.8 g/dL (ref 30.0–36.0)
MCV: 91.9 fL (ref 78.0–100.0)
MONO ABS: 0.7 10*3/uL (ref 0.1–1.0)
MONOS PCT: 16 % — AB (ref 3–12)
MPV: 8.9 fL (ref 8.6–12.4)
NEUTROS ABS: 2 10*3/uL (ref 1.7–7.7)
Neutrophils Relative %: 43 % (ref 43–77)
Platelets: 169 10*3/uL (ref 150–400)
RBC: 5.07 MIL/uL (ref 4.22–5.81)
RDW: 13.4 % (ref 11.5–15.5)
WBC: 4.6 10*3/uL (ref 4.0–10.5)

## 2014-10-31 LAB — COMPLETE METABOLIC PANEL WITH GFR
ALK PHOS: 94 U/L (ref 39–117)
ALT: 12 U/L (ref 0–53)
AST: 15 U/L (ref 0–37)
Albumin: 3.9 g/dL (ref 3.5–5.2)
BILIRUBIN TOTAL: 0.7 mg/dL (ref 0.2–1.2)
BUN: 13 mg/dL (ref 6–23)
CO2: 25 mEq/L (ref 19–32)
Calcium: 8.9 mg/dL (ref 8.4–10.5)
Chloride: 103 mEq/L (ref 96–112)
Creat: 0.92 mg/dL (ref 0.50–1.35)
GFR, EST NON AFRICAN AMERICAN: 82 mL/min
GFR, Est African American: >89 mL/min
GLUCOSE: 96 mg/dL (ref 70–99)
Potassium: 4.5 mEq/L (ref 3.5–5.3)
SODIUM: 141 meq/L (ref 135–145)
Total Protein: 6.8 g/dL (ref 6.0–8.3)

## 2014-10-31 LAB — TSH: TSH: 5.507 u[IU]/mL — ABNORMAL HIGH (ref 0.350–4.500)

## 2014-10-31 LAB — LIPID PANEL
CHOLESTEROL: 164 mg/dL (ref 0–200)
HDL: 32 mg/dL — ABNORMAL LOW (ref >=40)
LDL Cholesterol: 99 mg/dL (ref 0–99)
Total CHOL/HDL Ratio: 5.1 Ratio
Triglycerides: 164 mg/dL — ABNORMAL HIGH (ref <150)
VLDL: 33 mg/dL (ref 0–40)

## 2014-11-01 LAB — PSA, MEDICARE: PSA: 1.58 ng/mL (ref <=4.00)

## 2014-11-07 ENCOUNTER — Ambulatory Visit (HOSPITAL_COMMUNITY)
Admission: RE | Admit: 2014-11-07 | Discharge: 2014-11-07 | Disposition: A | Discharge status: Home / Self Care | Payer: Medicare Other | Source: Ambulatory Visit | Attending: Family Medicine | Admitting: Family Medicine

## 2014-11-07 ENCOUNTER — Encounter: Payer: Self-pay | Admitting: Family Medicine

## 2014-11-07 ENCOUNTER — Ambulatory Visit (INDEPENDENT_AMBULATORY_CARE_PROVIDER_SITE_OTHER): Payer: Medicare Other | Admitting: Family Medicine

## 2014-11-07 VITALS — BP 114/78 | HR 76 | Temp 97.5°F | Resp 18 | Ht 71.0 in | Wt 189.0 lb

## 2014-11-07 DIAGNOSIS — J449 Chronic obstructive pulmonary disease, unspecified: Secondary | ICD-10-CM | POA: Diagnosis not present

## 2014-11-07 DIAGNOSIS — R0789 Other chest pain: Secondary | ICD-10-CM

## 2014-11-07 DIAGNOSIS — Z0000 Encounter for general adult medical examination without abnormal findings: Secondary | ICD-10-CM | POA: Diagnosis not present

## 2014-11-07 DIAGNOSIS — R079 Chest pain, unspecified: Secondary | ICD-10-CM | POA: Diagnosis not present

## 2014-11-07 NOTE — Progress Notes (Signed)
Subjective:    Patient ID: John Evans, male    DOB: 06-Jul-1941, 74 y.o.   MRN: [id number]  HPI Patient is here today for complete physical exam. He does report an 8 month history of occasional pain in his upper left chest. It only occurs when he is driving. It seems to be positional in nature. Patient is able to do physical activity and strenuous physical activity for hours without chest discomfort or shortness of breath. It never occurs at other times during the day but only when he is driving. It is a 1 on a scale of 1-10. There is no radiation of the pain into his arm. There is no shortness of breath or jaw claudication with it. The pain seems to be positional and musculoskeletal in nature. It improves when he is not driving a car. He has no pain going up flights of steps. He has no pain with sexual intercourse. He reports no dyspnea on exertion.  He is due for his colonoscopy. His prostate is checked by Dr. Jeffie Pollock.  Pneumonia vaccine, Prevnar 13, and the shingles vaccine all up-to-date. His PSA has been reduced greater than 50% on Avodart and Avodart seems to be helping with his symptoms. Lab on 10/31/2014  Component Date Value Ref Range Status  . PSA 10/31/2014 1.58  <=4.00 ng/mL Final   Comment: Test Methodology: ECLIA PSA (Electrochemiluminescence Immunoassay)   For PSA values from 2.5-4.0, particularly in younger men <63 years old, the AUA and NCCN suggest testing for % Free PSA (3515) and evaluation of the rate of increase in PSA (PSA velocity).   . Cholesterol 10/31/2014 164  0 - 200 mg/dL Final   Comment: ATP III Classification:       < 200        mg/dL        Desirable      200 - 239     mg/dL        Borderline High      >= 240        mg/dL        High     . Triglycerides 10/31/2014 164* <150 mg/dL Final  . HDL 10/31/2014 32* >=40 mg/dL Final   ** Please note change in reference range(s). **  . Total CHOL/HDL Ratio 10/31/2014 5.1   Final  . VLDL 10/31/2014 33  0 - 40  mg/dL Final  . LDL Cholesterol 10/31/2014 99  0 - 99 mg/dL Final   Comment:   Total Cholesterol/HDL Ratio:CHD Risk                        Coronary Heart Disease Risk Table                                        Men       Women          1/2 Average Risk              3.4        3.3              Average Risk              5.0        4.4           2X Average Risk  9.6        7.1           3X Average Risk             23.4       11.0 Use the calculated Patient Ratio above and the CHD Risk table  to determine the patient's CHD Risk. ATP III Classification (LDL):       < 100        mg/dL         Optimal      100 - 129     mg/dL         Near or Above Optimal      130 - 159     mg/dL         Borderline High      160 - 189     mg/dL         High       > 190        mg/dL         Very High     . Sodium 10/31/2014 141  135 - 145 mEq/L Final  . Potassium 10/31/2014 4.5  3.5 - 5.3 mEq/L Final  . Chloride 10/31/2014 103  96 - 112 mEq/L Final  . CO2 10/31/2014 25  19 - 32 mEq/L Final  . Glucose, Bld 10/31/2014 96  70 - 99 mg/dL Final  . BUN 10/31/2014 13  6 - 23 mg/dL Final  . Creat 10/31/2014 0.92  0.50 - 1.35 mg/dL Final  . Total Bilirubin 10/31/2014 0.7  0.2 - 1.2 mg/dL Final  . Alkaline Phosphatase 10/31/2014 94  39 - 117 U/L Final  . AST 10/31/2014 15  0 - 37 U/L Final  . ALT 10/31/2014 12  0 - 53 U/L Final  . Total Protein 10/31/2014 6.8  6.0 - 8.3 g/dL Final  . Albumin 10/31/2014 3.9  3.5 - 5.2 g/dL Final  . Calcium 10/31/2014 8.9  8.4 - 10.5 mg/dL Final  . GFR, Est African American 10/31/2014 >89   Final  . GFR, Est Non African American 10/31/2014 82   Final   Comment:   The estimated GFR is a calculation valid for adults (>=16 years old) that uses the CKD-EPI algorithm to adjust for age and sex. It is   not to be used for children, pregnant women, hospitalized patients,    patients on dialysis, or with rapidly changing kidney function. According to the NKDEP, eGFR >89 is  normal, 60-89 shows mild impairment, 30-59 shows moderate impairment, 15-29 shows severe impairment and <15 is ESRD.     . WBC 10/31/2014 4.6  4.0 - 10.5 K/uL Final  . RBC 10/31/2014 5.07  4.22 - 5.81 MIL/uL Final  . Hemoglobin 10/31/2014 16.2  13.0 - 17.0 g/dL Final  . HCT 10/31/2014 46.6  39.0 - 52.0 % Final  . MCV 10/31/2014 91.9  78.0 - 100.0 fL Final  . MCH 10/31/2014 32.0  26.0 - 34.0 pg Final  . MCHC 10/31/2014 34.8  30.0 - 36.0 g/dL Final  . RDW 10/31/2014 13.4  11.5 - 15.5 % Final  . Platelets 10/31/2014 169  150 - 400 K/uL Final  . MPV 10/31/2014 8.9  8.6 - 12.4 fL Final  . Neutrophils Relative % 10/31/2014 43  43 - 77 % Final  . Neutro Abs 10/31/2014 2.0  1.7 - 7.7 K/uL Final  . Lymphocytes Relative 10/31/2014 36  12 - 46 % Final  .  Lymphs Abs 10/31/2014 1.7  0.7 - 4.0 K/uL Final  . Monocytes Relative 10/31/2014 16* 3 - 12 % Final  . Monocytes Absolute 10/31/2014 0.7  0.1 - 1.0 K/uL Final  . Eosinophils Relative 10/31/2014 5  0 - 5 % Final  . Eosinophils Absolute 10/31/2014 0.2  0.0 - 0.7 K/uL Final  . Basophils Relative 10/31/2014 0  0 - 1 % Final  . Basophils Absolute 10/31/2014 0.0  0.0 - 0.1 K/uL Final  . Smear Review 10/31/2014 Criteria for review not met   Final  . TSH 10/31/2014 5.507* 0.350 - 4.500 uIU/mL Final   Past Medical History  Diagnosis Date  . Hypertension   . Panic attacks   . Hyperlipidemia   . Obstructive sleep apnea   . Insomnia   . BPH (benign prostatic hyperplasia)    Past Surgical History  Procedure Laterality Date  . Eye surgery    . Back surgery     Current Outpatient Prescriptions on File Prior to Visit  Medication Sig Dispense Refill  . allopurinol (ZYLOPRIM) 100 MG tablet Take 200 mg by mouth daily.    Marland Kitchen aspirin 81 MG tablet Take 81 mg by mouth daily.      Marland Kitchen dutasteride (AVODART) 0.5 MG capsule Take 0.5 mg by mouth daily.    . Efinaconazole (JUBLIA) 10 % SOLN Apply 1 application topically every morning. 1 Bottle 2  . gabapentin  (NEURONTIN) 300 MG capsule Take 1 capsule (300 mg total) by mouth 3 (three) times daily. 90 capsule 2  . lisinopril (PRINIVIL,ZESTRIL) 10 MG tablet TAKE ONE (1) TABLET BY MOUTH EVERY DAY 30 tablet 0  . tamsulosin (FLOMAX) 0.4 MG CAPS capsule Take 1 capsule (0.4 mg total) by mouth daily. 30 capsule 0  . rosuvastatin (CRESTOR) 5 MG tablet Take 1 tablet (5 mg total) by mouth daily. (Patient not taking: Reported on 11/07/2014) 30 tablet 3  . terbinafine (LAMISIL) 250 MG tablet Take 1 tablet (250 mg total) by mouth daily. (Patient not taking: Reported on 11/07/2014) 30 tablet 2   No current facility-administered medications on file prior to visit.   Allergies  Allergen Reactions  . Latex Rash   History   Social History  . Marital Status: Married    Spouse Name: N/A  . Number of Children: N/A  . Years of Education: N/A   Occupational History  . Not on file.   Social History Main Topics  . Smoking status: Never Smoker   . Smokeless tobacco: Never Used  . Alcohol Use: No  . Drug Use: No  . Sexual Activity: Yes     Comment: married, retired Software engineer   Other Topics Concern  . Not on file   Social History Narrative   Family History  Problem Relation Age of Onset  . Cancer Father     throat  . Diabetes Paternal Grandmother       Review of Systems  All other systems reviewed and are negative.      Objective:   Physical Exam  Constitutional: He is oriented to person, place, and time. He appears well-developed and well-nourished. No distress.  HENT:  Head: Normocephalic and atraumatic.  Right Ear: External ear normal.  Left Ear: External ear normal.  Nose: Nose normal.  Mouth/Throat: Oropharynx is clear and moist. No oropharyngeal exudate.  Eyes: Conjunctivae and EOM are normal. Pupils are equal, round, and reactive to light. Right eye exhibits no discharge. Left eye exhibits no discharge. No scleral icterus.  Neck: Normal  range of motion. Neck supple. No JVD present. No  tracheal deviation present. No thyromegaly present.  Cardiovascular: Normal rate, regular rhythm, normal heart sounds and intact distal pulses.  Exam reveals no gallop and no friction rub.   No murmur heard. Pulmonary/Chest: Effort normal and breath sounds normal. No stridor. No respiratory distress. He has no wheezes. He has no rales. He exhibits no tenderness.  Abdominal: Soft. Bowel sounds are normal. He exhibits no distension and no mass. There is no tenderness. There is no rebound and no guarding.  Musculoskeletal: Normal range of motion. He exhibits no edema or tenderness.  Lymphadenopathy:    He has no cervical adenopathy.  Neurological: He is alert and oriented to person, place, and time. He has normal reflexes. He displays normal reflexes. No cranial nerve deficit. He exhibits normal muscle tone. Coordination normal.  Skin: Skin is warm. No rash noted. He is not diaphoretic. No erythema. No pallor.  Psychiatric: He has a normal mood and affect. His behavior is normal. Judgment and thought content normal.  Vitals reviewed.         Assessment & Plan:  Other chest pain - Plan: DG Chest 2 View  Routine general medical examination at a health care facility  Patient's physical exam is completely normal. His lab work is excellent. I did recommend increasing aerobic exercise to address his low HDL cholesterol. He is not interested in trying Niaspan. His immunizations are up-to-date. His prostate will be checked by his urologist this summer. His PSA is much better. I recommended a colonoscopy but the patient declined. I asked him to think about it and I will be glad to schedule him if he desires to have this done. If he refuses a colonoscopy, I recommended we at least pursue stool cards 3. I would like the patient to obtain a chest x-ray given the chronicity of his chest pain. I believe this is chest wall pain and musculoskeletal in nature. I do not see any concerning findings on his exam or  concerning symptoms in his history make me believe that this is cardiac in nature. I will obtain an EKG today to rule out any changes that would necessitate a stress test. EKG is normal sinus rhythm with no significant ischemia or infarction. Patient has normal intervals and normal axis. I will await the results of his chest x-ray.

## 2014-11-14 DIAGNOSIS — I781 Nevus, non-neoplastic: Secondary | ICD-10-CM | POA: Diagnosis not present

## 2014-11-14 DIAGNOSIS — L578 Other skin changes due to chronic exposure to nonionizing radiation: Secondary | ICD-10-CM | POA: Diagnosis not present

## 2014-11-14 DIAGNOSIS — L819 Disorder of pigmentation, unspecified: Secondary | ICD-10-CM | POA: Diagnosis not present

## 2014-11-14 DIAGNOSIS — L821 Other seborrheic keratosis: Secondary | ICD-10-CM | POA: Diagnosis not present

## 2014-11-27 ENCOUNTER — Other Ambulatory Visit: Payer: Self-pay | Admitting: Family Medicine

## 2014-11-27 DIAGNOSIS — G629 Polyneuropathy, unspecified: Secondary | ICD-10-CM

## 2014-11-27 MED ORDER — DUTASTERIDE 0.5 MG PO CAPS: 0.5000 mg | ORAL_CAPSULE | Freq: Every day | ORAL | Status: DC

## 2014-11-27 MED ORDER — GABAPENTIN 300 MG PO CAPS: 300.0000 mg | ORAL_CAPSULE | Freq: Three times a day (TID) | ORAL | Status: DC

## 2014-11-27 NOTE — Telephone Encounter (Signed)
Refill appropriate and filled per protocol. 

## 2015-01-14 ENCOUNTER — Encounter: Payer: Self-pay | Admitting: Internal Medicine

## 2015-01-14 DIAGNOSIS — N509 Disorder of male genital organs, unspecified: Secondary | ICD-10-CM | POA: Diagnosis not present

## 2015-01-14 DIAGNOSIS — N390 Urinary tract infection, site not specified: Secondary | ICD-10-CM | POA: Diagnosis not present

## 2015-01-14 DIAGNOSIS — N4340 Spermatocele of epididymis, unspecified: Secondary | ICD-10-CM | POA: Diagnosis not present

## 2015-01-29 DIAGNOSIS — H35372 Puckering of macula, left eye: Secondary | ICD-10-CM | POA: Diagnosis not present

## 2015-01-29 DIAGNOSIS — H4720 Unspecified optic atrophy: Secondary | ICD-10-CM | POA: Diagnosis not present

## 2015-01-29 DIAGNOSIS — Z961 Presence of intraocular lens: Secondary | ICD-10-CM | POA: Diagnosis not present

## 2015-01-29 DIAGNOSIS — H2511 Age-related nuclear cataract, right eye: Secondary | ICD-10-CM | POA: Diagnosis not present

## 2015-02-09 ENCOUNTER — Other Ambulatory Visit: Payer: Self-pay

## 2015-02-11 ENCOUNTER — Ambulatory Visit (INDEPENDENT_AMBULATORY_CARE_PROVIDER_SITE_OTHER): Payer: Medicare Other | Admitting: Family Medicine

## 2015-02-11 ENCOUNTER — Encounter: Payer: Self-pay | Admitting: Family Medicine

## 2015-02-11 VITALS — BP 126/68 | HR 82 | Temp 98.2°F | Resp 14 | Ht 71.0 in | Wt 183.0 lb

## 2015-02-11 DIAGNOSIS — J069 Acute upper respiratory infection, unspecified: Secondary | ICD-10-CM | POA: Diagnosis not present

## 2015-02-11 DIAGNOSIS — J0100 Acute maxillary sinusitis, unspecified: Secondary | ICD-10-CM

## 2015-02-11 MED ORDER — AMOXICILLIN 875 MG PO TABS: 875.0000 mg | ORAL_TABLET | Freq: Two times a day (BID) | ORAL | Status: DC

## 2015-02-11 MED ORDER — GUAIFENESIN-CODEINE 100-10 MG/5ML PO SOLN: 5.0000 mL | Freq: Four times a day (QID) | ORAL | Status: DC | PRN

## 2015-02-11 NOTE — Progress Notes (Signed)
Patient ID: John Evans, male   DOB: 1940/08/21, 74 y.o.   MRN: [id number]   Subjective:    Patient ID: John Evans, male    DOB: Sep 24, 1940, 74 y.o.   MRN: [id number]  Patient presents for Illness  Sinus drainage, post nasal drip, headahe/pressure for past 4-5 days, now with productive cough, worse at night, fatigue. No SOB, had 1 episode of emesis, NB/NB, no diarrhea. NO OTC meds taken, no sick contacts     Review Of Systems:  GEN- + fatigue, fever, weight loss,weakness, recent illness HEENT- denies eye drainage, change in vision,+ nasal discharge, CVS- denies chest pain, palpitations RESP- denies SOB,+ cough, wheeze ABD- denies N/V, change in stools, abd pain GU- denies dysuria, hematuria, dribbling, incontinence MSK- denies joint pain, muscle aches, injury Neuro- denies headache, dizziness, syncope, seizure activity       Objective:    BP 126/68 mmHg  Pulse 82  Temp(Src) 98.2 F (36.8 C) (Oral)  Resp 14  Ht 5\' 11"  (1.803 m)  Wt 183 lb (83.008 kg)  BMI 25.53 kg/m2 GEN- NAD, alert and oriented x3 HEENT- PERRL, EOMI, non injected sclera, pink conjunctiva, MMM, oropharynx mild injection, TM clear bilat no effusion,  + maxillary sinus tenderness, inflammed turbinates,  Nasal drainage  Neck- Supple, no LAD CVS- RRR, no murmur RESP-Course BS bilat, no rales, no wheeze, upper airway congestion, normal WOB EXT- No edema Pulses- Radial 2+          Assessment & Plan:      Problem List Items Addressed This Visit    None    Visit Diagnoses    Acute maxillary sinusitis, recurrence not specified    -  Primary    Amox BID x 10 days, nasal saline, robitussin AC bedtime, mucinex DM during day, humidifer, post nasal led to chest component    Relevant Medications    amoxicillin (AMOXIL) 875 MG tablet    guaiFENesin-codeine 100-10 MG/5ML syrup    Acute URI           Note: This dictation was prepared with Dragon dictation along with smaller phrase technology.  Any transcriptional errors that result from this process are unintentional.

## 2015-02-11 NOTE — Patient Instructions (Signed)
F/u as needed Antibiotics prescribed Nasal saline/ cough syrup

## 2015-02-25 DIAGNOSIS — R972 Elevated prostate specific antigen [PSA]: Secondary | ICD-10-CM | POA: Diagnosis not present

## 2015-04-30 ENCOUNTER — Other Ambulatory Visit: Payer: Self-pay | Admitting: Family Medicine

## 2015-04-30 NOTE — Telephone Encounter (Signed)
Refill appropriate and filled per protocol. 

## 2015-05-01 ENCOUNTER — Other Ambulatory Visit: Payer: Self-pay | Admitting: Family Medicine

## 2015-05-01 NOTE — Telephone Encounter (Signed)
Medication refilled per protocol. 

## 2015-05-08 DIAGNOSIS — N401 Enlarged prostate with lower urinary tract symptoms: Secondary | ICD-10-CM | POA: Diagnosis not present

## 2015-05-08 DIAGNOSIS — R339 Retention of urine, unspecified: Secondary | ICD-10-CM | POA: Diagnosis not present

## 2015-05-08 DIAGNOSIS — N138 Other obstructive and reflux uropathy: Secondary | ICD-10-CM | POA: Diagnosis not present

## 2015-05-08 DIAGNOSIS — N4340 Spermatocele of epididymis, unspecified: Secondary | ICD-10-CM | POA: Diagnosis not present

## 2015-05-08 DIAGNOSIS — R351 Nocturia: Secondary | ICD-10-CM | POA: Diagnosis not present

## 2015-05-26 DIAGNOSIS — Z23 Encounter for immunization: Secondary | ICD-10-CM | POA: Diagnosis not present

## 2015-07-02 DIAGNOSIS — Z961 Presence of intraocular lens: Secondary | ICD-10-CM | POA: Diagnosis not present

## 2015-07-02 DIAGNOSIS — H401131 Primary open-angle glaucoma, bilateral, mild stage: Secondary | ICD-10-CM | POA: Diagnosis not present

## 2015-07-02 DIAGNOSIS — H35352 Cystoid macular degeneration, left eye: Secondary | ICD-10-CM | POA: Diagnosis not present

## 2015-07-02 DIAGNOSIS — H2511 Age-related nuclear cataract, right eye: Secondary | ICD-10-CM | POA: Diagnosis not present

## 2015-07-02 DIAGNOSIS — H35372 Puckering of macula, left eye: Secondary | ICD-10-CM | POA: Diagnosis not present

## 2015-10-05 ENCOUNTER — Other Ambulatory Visit: Payer: Self-pay | Admitting: Family Medicine

## 2015-10-05 NOTE — Telephone Encounter (Signed)
Refill appropriate and filled per protocol. 

## 2015-10-15 DIAGNOSIS — D1801 Hemangioma of skin and subcutaneous tissue: Secondary | ICD-10-CM | POA: Diagnosis not present

## 2015-10-15 DIAGNOSIS — L821 Other seborrheic keratosis: Secondary | ICD-10-CM | POA: Diagnosis not present

## 2015-10-15 DIAGNOSIS — C44612 Basal cell carcinoma of skin of right upper limb, including shoulder: Secondary | ICD-10-CM | POA: Diagnosis not present

## 2015-10-15 DIAGNOSIS — D225 Melanocytic nevi of trunk: Secondary | ICD-10-CM | POA: Diagnosis not present

## 2015-11-24 DIAGNOSIS — R972 Elevated prostate specific antigen [PSA]: Secondary | ICD-10-CM | POA: Diagnosis not present

## 2015-11-30 DIAGNOSIS — N401 Enlarged prostate with lower urinary tract symptoms: Secondary | ICD-10-CM | POA: Diagnosis not present

## 2015-11-30 DIAGNOSIS — Z0000 Encounter for general adult medical examination without abnormal findings: Secondary | ICD-10-CM | POA: Diagnosis not present

## 2015-11-30 DIAGNOSIS — N138 Other obstructive and reflux uropathy: Secondary | ICD-10-CM | POA: Diagnosis not present

## 2015-12-22 ENCOUNTER — Other Ambulatory Visit: Payer: Medicare Other

## 2015-12-22 DIAGNOSIS — Z125 Encounter for screening for malignant neoplasm of prostate: Secondary | ICD-10-CM | POA: Diagnosis not present

## 2015-12-22 DIAGNOSIS — N400 Enlarged prostate without lower urinary tract symptoms: Secondary | ICD-10-CM

## 2015-12-22 DIAGNOSIS — I10 Essential (primary) hypertension: Secondary | ICD-10-CM

## 2015-12-22 DIAGNOSIS — E783 Hyperchylomicronemia: Secondary | ICD-10-CM

## 2015-12-22 DIAGNOSIS — Z1329 Encounter for screening for other suspected endocrine disorder: Secondary | ICD-10-CM

## 2015-12-22 LAB — CBC WITH DIFFERENTIAL/PLATELET
BASOS ABS: 0 {cells}/uL (ref 0–200)
BASOS PCT: 0 %
Eosinophils Absolute: 69 cells/uL (ref 15–500)
Eosinophils Relative: 1 %
HEMATOCRIT: 46.3 % (ref 38.5–50.0)
HEMOGLOBIN: 15.9 g/dL (ref 13.0–17.0)
LYMPHS ABS: 1518 {cells}/uL (ref 850–3900)
Lymphocytes Relative: 22 %
MCH: 31.6 pg (ref 27.0–33.0)
MCHC: 34.3 g/dL (ref 32.0–36.0)
MCV: 92 fL (ref 80.0–100.0)
MONO ABS: 759 {cells}/uL (ref 200–950)
MONOS PCT: 11 %
MPV: 9.4 fL (ref 7.5–12.5)
NEUTROS ABS: 4554 {cells}/uL (ref 1500–7800)
Neutrophils Relative %: 66 %
PLATELETS: 177 10*3/uL (ref 140–400)
RBC: 5.03 MIL/uL (ref 4.20–5.80)
RDW: 13.7 % (ref 11.0–15.0)
WBC: 6.9 10*3/uL (ref 3.8–10.8)

## 2015-12-22 LAB — COMPLETE METABOLIC PANEL WITH GFR
ALBUMIN: 4 g/dL (ref 3.6–5.1)
ALK PHOS: 75 U/L (ref 40–115)
ALT: 13 U/L (ref 9–46)
AST: 16 U/L (ref 10–35)
BILIRUBIN TOTAL: 0.6 mg/dL (ref 0.2–1.2)
BUN: 15 mg/dL (ref 7–25)
CO2: 26 mmol/L (ref 20–31)
CREATININE: 0.93 mg/dL (ref 0.70–1.18)
Calcium: 8.9 mg/dL (ref 8.6–10.3)
Chloride: 102 mmol/L (ref 98–110)
GFR, Est Non African American: 80 mL/min (ref >=60)
Glucose, Bld: 92 mg/dL (ref 70–99)
Potassium: 4.2 mmol/L (ref 3.5–5.3)
Sodium: 137 mmol/L (ref 135–146)
TOTAL PROTEIN: 6.3 g/dL (ref 6.1–8.1)

## 2015-12-22 LAB — TSH: TSH: 2.06 mIU/L (ref 0.40–4.50)

## 2015-12-22 LAB — URIC ACID: Uric Acid, Serum: 5.1 mg/dL (ref 4.0–7.8)

## 2015-12-22 LAB — LIPID PANEL
Cholesterol: 146 mg/dL (ref 125–200)
HDL: 33 mg/dL — ABNORMAL LOW (ref >=40)
LDL CALC: 78 mg/dL (ref <130)
TRIGLYCERIDES: 174 mg/dL — AB (ref <150)
Total CHOL/HDL Ratio: 4.4 Ratio (ref <=5.0)
VLDL: 35 mg/dL — ABNORMAL HIGH (ref <30)

## 2015-12-23 LAB — PSA: PSA: 1.22 ng/mL (ref <=4.00)

## 2015-12-29 ENCOUNTER — Encounter: Payer: Self-pay | Admitting: Family Medicine

## 2015-12-29 ENCOUNTER — Ambulatory Visit (INDEPENDENT_AMBULATORY_CARE_PROVIDER_SITE_OTHER): Payer: Medicare Other | Admitting: Family Medicine

## 2015-12-29 VITALS — BP 114/76 | HR 64 | Temp 97.7°F | Resp 18 | Ht 71.0 in | Wt 190.0 lb

## 2015-12-29 DIAGNOSIS — Z0000 Encounter for general adult medical examination without abnormal findings: Secondary | ICD-10-CM | POA: Diagnosis not present

## 2015-12-29 NOTE — Progress Notes (Signed)
Subjective:    Patient ID: John Evans, male    DOB: 1940-10-02, 75 y.o.   MRN: [id number]  HPI Here today for complete physical exam. He is due for colonoscopy. He does not want to proceed with a colonoscopy because of his age. He is just seen his urologist. His PSA is excellent at 1.22. His immunizations are up-to-date. He has had a shingles vaccine and outside office. He declines a tetanus shot. His most recent lab work was outstanding and significant only for low HDL cholesterol area and otherwise he is doing well with no concerns. Immunization History  Administered Date(s) Administered  . Pneumococcal Conjugate-13 09/05/2013  . Pneumococcal Polysaccharide-23 05/15/2002  . Td 08/15/1978   Last colonoscopy was 08/2007 and due to polyps was recommended again in 5-7 years.  Past Medical History  Diagnosis Date  . Hypertension   . Panic attacks   . Hyperlipidemia   . Obstructive sleep apnea   . Insomnia   . BPH (benign prostatic hyperplasia)    Past Surgical History  Procedure Laterality Date  . Eye surgery    . Back surgery     Current Outpatient Prescriptions on File Prior to Visit  Medication Sig Dispense Refill  . allopurinol (ZYLOPRIM) 100 MG tablet TAKE TWO (2) TABLETS BY MOUTH DAILY 60 tablet 3  . amoxicillin (AMOXIL) 875 MG tablet Take 1 tablet (875 mg total) by mouth 2 (two) times daily. 20 tablet 0  . aspirin 81 MG tablet Take 81 mg by mouth daily.      Marland Kitchen dutasteride (AVODART) 0.5 MG capsule Take 1 capsule (0.5 mg total) by mouth daily. 30 capsule 6  . Efinaconazole (JUBLIA) 10 % SOLN Apply 1 application topically every morning. 1 Bottle 2  . gabapentin (NEURONTIN) 300 MG capsule Take 1 capsule (300 mg total) by mouth 3 (three) times daily. 90 capsule 6  . guaiFENesin-codeine 100-10 MG/5ML syrup Take 5 mLs by mouth every 6 (six) hours as needed. 180 mL 0  . lisinopril (PRINIVIL,ZESTRIL) 10 MG tablet TAKE ONE (1) TABLET BY MOUTH EVERY DAY 90 tablet 3  .  tamsulosin (FLOMAX) 0.4 MG CAPS capsule Take 1 capsule (0.4 mg total) by mouth daily. 30 capsule 0   No current facility-administered medications on file prior to visit.   Allergies  Allergen Reactions  . Latex Rash   Social History   Social History  . Marital Status: Married    Spouse Name: N/A  . Number of Children: N/A  . Years of Education: N/A   Occupational History  . Not on file.   Social History Main Topics  . Smoking status: Never Smoker   . Smokeless tobacco: Never Used  . Alcohol Use: No  . Drug Use: No  . Sexual Activity: Yes     Comment: married, retired Software engineer   Other Topics Concern  . Not on file   Social History Narrative     Review of Systems  All other systems reviewed and are negative.      Objective:   Physical Exam  Constitutional: He is oriented to person, place, and time. He appears well-developed and well-nourished. No distress.  HENT:  Head: Normocephalic and atraumatic.  Right Ear: External ear normal.  Left Ear: External ear normal.  Nose: Nose normal.  Mouth/Throat: Oropharynx is clear and moist. No oropharyngeal exudate.  Eyes: Conjunctivae and EOM are normal. Pupils are equal, round, and reactive to light. Right eye exhibits no discharge. Left eye exhibits no discharge.  No scleral icterus.  Neck: Normal range of motion. Neck supple. No JVD present. No tracheal deviation present. No thyromegaly present.  Cardiovascular: Normal rate, regular rhythm, normal heart sounds and intact distal pulses.  Exam reveals no gallop and no friction rub.   No murmur heard. Pulmonary/Chest: Effort normal and breath sounds normal. No stridor. No respiratory distress. He has no wheezes. He has no rales. He exhibits no tenderness.  Abdominal: Soft. Bowel sounds are normal. He exhibits no distension and no mass. There is no tenderness. There is no rebound and no guarding.  Genitourinary: Rectum normal.  Musculoskeletal: Normal range of motion. He  exhibits no edema or tenderness.  Lymphadenopathy:    He has no cervical adenopathy.  Neurological: He is alert and oriented to person, place, and time. He has normal reflexes. No cranial nerve deficit. He exhibits normal muscle tone. Coordination normal.  Skin: Skin is warm. No rash noted. He is not diaphoretic. No erythema. No pallor.  Psychiatric: He has a normal mood and affect. His behavior is normal. Judgment and thought content normal.  Vitals reviewed.        Assessment & Plan:  Routine general medical examination at a health care facility  We did discuss some insomnia he is having. I recommended good sleep hygiene practices and also increasing aerobic exercise. His HDL cholesterol is low. We discussed Niaspan but he elects to use increasing aerobic exercise to address. Immunizations are up-to-date. He declines a tetanus shot. He prefers not to have another colonoscopy but he is interested in performing cologuard screening.  We will help schedule this for the patient. His PSA is within normal limits. The remainder of his preventative care is up-to-date Lab on 12/22/2015  Component Date Value Ref Range Status  . TSH 12/22/2015 2.06  0.40 - 4.50 mIU/L Final  . PSA 12/22/2015 1.22  <=4.00 ng/mL Final   Comment: Test Methodology: ECLIA PSA (Electrochemiluminescence Immunoassay)   For PSA values from 2.5-4.0, particularly in younger men <25 years old, the AUA and NCCN suggest testing for % Free PSA (3515) and evaluation of the rate of increase in PSA (PSA velocity).   . Uric Acid, Serum 12/22/2015 5.1  4.0 - 7.8 mg/dL Final  . WBC 12/22/2015 6.9  3.8 - 10.8 K/uL Final  . RBC 12/22/2015 5.03  4.20 - 5.80 MIL/uL Final  . Hemoglobin 12/22/2015 15.9  13.0 - 17.0 g/dL Final  . HCT 12/22/2015 46.3  38.5 - 50.0 % Final  . MCV 12/22/2015 92.0  80.0 - 100.0 fL Final  . MCH 12/22/2015 31.6  27.0 - 33.0 pg Final  . MCHC 12/22/2015 34.3  32.0 - 36.0 g/dL Final  . RDW 12/22/2015 13.7  11.0 -  15.0 % Final  . Platelets 12/22/2015 177  140 - 400 K/uL Final  . MPV 12/22/2015 9.4  7.5 - 12.5 fL Final  . Neutro Abs 12/22/2015 4554  1500 - 7800 cells/uL Final  . Lymphs Abs 12/22/2015 1518  850 - 3900 cells/uL Final  . Monocytes Absolute 12/22/2015 759  200 - 950 cells/uL Final  . Eosinophils Absolute 12/22/2015 69  15 - 500 cells/uL Final  . Basophils Absolute 12/22/2015 0  0 - 200 cells/uL Final  . Neutrophils Relative % 12/22/2015 66   Final  . Lymphocytes Relative 12/22/2015 22   Final  . Monocytes Relative 12/22/2015 11   Final  . Eosinophils Relative 12/22/2015 1   Final  . Basophils Relative 12/22/2015 0   Final  .  Smear Review 12/22/2015 Criteria for review not met   Final   ** Please note change in unit of measure and reference range(s). **  . Sodium 12/22/2015 137  135 - 146 mmol/L Final  . Potassium 12/22/2015 4.2  3.5 - 5.3 mmol/L Final  . Chloride 12/22/2015 102  98 - 110 mmol/L Final  . CO2 12/22/2015 26  20 - 31 mmol/L Final  . Glucose, Bld 12/22/2015 92  70 - 99 mg/dL Final  . BUN 12/22/2015 15  7 - 25 mg/dL Final  . Creat 12/22/2015 0.93  0.70 - 1.18 mg/dL Final  . Total Bilirubin 12/22/2015 0.6  0.2 - 1.2 mg/dL Final  . Alkaline Phosphatase 12/22/2015 75  40 - 115 U/L Final  . AST 12/22/2015 16  10 - 35 U/L Final  . ALT 12/22/2015 13  9 - 46 U/L Final  . Total Protein 12/22/2015 6.3  6.1 - 8.1 g/dL Final  . Albumin 12/22/2015 4.0  3.6 - 5.1 g/dL Final  . Calcium 12/22/2015 8.9  8.6 - 10.3 mg/dL Final  . GFR, Est African American 12/22/2015 >89  >=60 mL/min Final  . GFR, Est Non African American 12/22/2015 80  >=60 mL/min Final   Comment:   The estimated GFR is a calculation valid for adults (>=2 years old) that uses the CKD-EPI algorithm to adjust for age and sex. It is   not to be used for children, pregnant women, hospitalized patients,    patients on dialysis, or with rapidly changing kidney function. According to the NKDEP, eGFR >89 is normal, 60-89  shows mild impairment, 30-59 shows moderate impairment, 15-29 shows severe impairment and <15 is ESRD.     Marland Kitchen Cholesterol 12/22/2015 146  125 - 200 mg/dL Final  . Triglycerides 12/22/2015 174* <150 mg/dL Final  . HDL 12/22/2015 33* >=40 mg/dL Final  . Total CHOL/HDL Ratio 12/22/2015 4.4  <=5.0 Ratio Final  . VLDL 12/22/2015 35* <30 mg/dL Final  . LDL Cholesterol 12/22/2015 78  <130 mg/dL Final   Comment:   Total Cholesterol/HDL Ratio:CHD Risk                        Coronary Heart Disease Risk Table                                        Men       Women          1/2 Average Risk              3.4        3.3              Average Risk              5.0        4.4           2X Average Risk              9.6        7.1           3X Average Risk             23.4       11.0 Use the calculated Patient Ratio above and the CHD Risk table  to determine the patient's CHD Risk.

## 2016-01-05 DIAGNOSIS — Z1212 Encounter for screening for malignant neoplasm of rectum: Secondary | ICD-10-CM | POA: Diagnosis not present

## 2016-01-05 DIAGNOSIS — Z1211 Encounter for screening for malignant neoplasm of colon: Secondary | ICD-10-CM | POA: Diagnosis not present

## 2016-02-04 ENCOUNTER — Other Ambulatory Visit: Payer: Self-pay | Admitting: Family Medicine

## 2016-02-04 NOTE — Telephone Encounter (Signed)
Refill appropriate and filled per protocol. 

## 2016-03-03 DIAGNOSIS — H2511 Age-related nuclear cataract, right eye: Secondary | ICD-10-CM | POA: Diagnosis not present

## 2016-03-03 DIAGNOSIS — H35372 Puckering of macula, left eye: Secondary | ICD-10-CM | POA: Diagnosis not present

## 2016-03-03 DIAGNOSIS — Z961 Presence of intraocular lens: Secondary | ICD-10-CM | POA: Diagnosis not present

## 2016-03-03 DIAGNOSIS — H4720 Unspecified optic atrophy: Secondary | ICD-10-CM | POA: Diagnosis not present

## 2016-03-07 ENCOUNTER — Encounter: Payer: Self-pay | Admitting: Gastroenterology

## 2016-03-07 ENCOUNTER — Other Ambulatory Visit: Payer: Self-pay | Admitting: Family Medicine

## 2016-03-07 DIAGNOSIS — Z1211 Encounter for screening for malignant neoplasm of colon: Secondary | ICD-10-CM

## 2016-04-26 ENCOUNTER — Ambulatory Visit (AMBULATORY_SURGERY_CENTER): Payer: Self-pay | Admitting: *Deleted

## 2016-04-26 VITALS — Ht 71.0 in | Wt 193.0 lb

## 2016-04-26 DIAGNOSIS — Z85828 Personal history of other malignant neoplasm of skin: Secondary | ICD-10-CM | POA: Diagnosis not present

## 2016-04-26 DIAGNOSIS — D1801 Hemangioma of skin and subcutaneous tissue: Secondary | ICD-10-CM | POA: Diagnosis not present

## 2016-04-26 DIAGNOSIS — L821 Other seborrheic keratosis: Secondary | ICD-10-CM | POA: Diagnosis not present

## 2016-04-26 DIAGNOSIS — Z86010 Personal history of colonic polyps: Secondary | ICD-10-CM

## 2016-04-26 DIAGNOSIS — D225 Melanocytic nevi of trunk: Secondary | ICD-10-CM | POA: Diagnosis not present

## 2016-04-26 DIAGNOSIS — L570 Actinic keratosis: Secondary | ICD-10-CM | POA: Diagnosis not present

## 2016-04-26 MED ORDER — NA SULFATE-K SULFATE-MG SULF 17.5-3.13-1.6 GM/177ML PO SOLN: 1.0000 | Freq: Once | ORAL | 0 refills | Status: AC

## 2016-04-26 NOTE — Progress Notes (Signed)
Denies allergies to eggs or soy products. Denies complications with sedation or anesthesia. Denies O2 use. Denies use of diet or weight loss medications.  Emmi instructions given for colonoscopy.  

## 2016-05-02 ENCOUNTER — Encounter: Payer: Self-pay | Admitting: Gastroenterology

## 2016-05-16 ENCOUNTER — Encounter: Payer: Self-pay | Admitting: Gastroenterology

## 2016-05-16 ENCOUNTER — Ambulatory Visit (AMBULATORY_SURGERY_CENTER): Payer: Medicare Other | Admitting: Gastroenterology

## 2016-05-16 VITALS — BP 115/74 | HR 69 | Temp 97.5°F | Resp 10 | Ht 71.0 in | Wt 193.0 lb

## 2016-05-16 DIAGNOSIS — Z1211 Encounter for screening for malignant neoplasm of colon: Secondary | ICD-10-CM | POA: Diagnosis not present

## 2016-05-16 DIAGNOSIS — Z86010 Personal history of colonic polyps: Secondary | ICD-10-CM | POA: Diagnosis present

## 2016-05-16 DIAGNOSIS — N400 Enlarged prostate without lower urinary tract symptoms: Secondary | ICD-10-CM | POA: Diagnosis not present

## 2016-05-16 DIAGNOSIS — D122 Benign neoplasm of ascending colon: Secondary | ICD-10-CM | POA: Diagnosis not present

## 2016-05-16 MED ORDER — SODIUM CHLORIDE 0.9 % IV SOLN: 500.0000 mL | INTRAVENOUS | Status: DC

## 2016-05-16 NOTE — Op Note (Signed)
[facility] Patient Name: John Evans Procedure Date: 05/16/2016 10:31 AM MRN: [id number] Endoscopist: Makanda. Loletha Carrow , MD Age: 75 Referring MD:  Date of Birth: [birth year] Gender: Male Account #: [id number] Procedure:                Colonoscopy Indications:              Surveillance: Personal history of adenomatous                            polyps on last colonoscopy > 5 years ago (two subcm                            TA in 08/2007) Medicines:                Monitored Anesthesia Care Procedure:                Pre-Anesthesia Assessment:                           - Prior to the procedure, a History and Physical                            was performed, and patient medications and                            allergies were reviewed. The patient's tolerance of                            previous anesthesia was also reviewed. The risks                            and benefits of the procedure and the sedation                            options and risks were discussed with the patient.                            All questions were answered, and informed consent                            was obtained. Anticoagulants: The patient has taken                            aspirin. It was decided not to withhold this                            medication prior to the procedure. ASA Grade                            Assessment: III - A patient with severe systemic                            disease. After reviewing the risks and benefits,  the patient was deemed in satisfactory condition to                            undergo the procedure.                           After obtaining informed consent, the colonoscope                            was passed under direct vision. Throughout the                            procedure, the patient's blood pressure, pulse, and                            oxygen saturations were monitored continuously. The                             Model CF-HQ190L [id number]) scope was introduced                            through the anus and advanced to the the cecum,                            identified by appendiceal orifice and ileocecal                            valve. The colonoscopy was performed without                            difficulty. The patient tolerated the procedure                            well. The quality of the bowel preparation was                            excellent. The ileocecal valve, appendiceal                            orifice, and rectum were photographed. The quality                            of the bowel preparation was evaluated using the                            [facility] Bowel Preparation Scale) with scores                            of: Right Colon = 3, Transverse Colon = 3 and Left                            Colon = 2. The total BBPS score equals 8. The bowel  preparation used was SUPREP. Scope In: 10:39:01 AM Scope Out: 10:53:09 AM Scope Withdrawal Time: 0 hours 9 minutes 0 seconds  Total Procedure Duration: 0 hours 14 minutes 8 seconds  Findings:                 The perianal and digital rectal examinations were                            normal.                           Two sessile polyps were found in the ascending                            colon. The polyps were 2 to 4 mm in size. These                            polyps were removed with a piecemeal technique                            using a cold biopsy forceps. Resection and                            retrieval were complete.                           Multiple small-mouthed diverticula were found in                            the left colon.                           The exam was otherwise without abnormality on                            direct and retroflexion views. Complications:            No immediate complications. Estimated Blood Loss:     Estimated blood loss was  minimal. Impression:               - Two 2 to 4 mm polyps in the ascending colon,                            removed piecemeal using a cold biopsy forceps.                            Resected and retrieved.                           - Diverticulosis in the left colon.                           - The examination was otherwise normal on direct                            and retroflexion views. Recommendation:           -  Patient has a contact number available for                            emergencies. The signs and symptoms of potential                            delayed complications were discussed with the                            patient. Return to normal activities tomorrow.                            Written discharge instructions were provided to the                            patient.                           - Resume previous diet.                           - No aspirin, ibuprofen, naproxen, or other                            non-steroidal anti-inflammatory drugs for 3 days                            after polyp removal.                           - Await pathology results.                           - No recommendation at this time regarding repeat                            colonoscopy due to age. Avram Danielson L. Loletha Carrow, MD 05/16/2016 10:57:21 AM This report has been signed electronically.

## 2016-05-16 NOTE — Progress Notes (Signed)
Called to room to assist during endoscopic procedure.  Patient ID and intended procedure confirmed with present staff. Received instructions for my participation in the procedure from the performing physician.  

## 2016-05-16 NOTE — Patient Instructions (Signed)
YOU HAD AN ENDOSCOPIC PROCEDURE TODAY AT [facility]:   Refer to the procedure report that was given to you for any specific questions about what was found during the examination.  If the procedure report does not answer your questions, please call your gastroenterologist to clarify.  If you requested that your care partner not be given the details of your procedure findings, then the procedure report has been included in a sealed envelope for you to review at your convenience later.  YOU SHOULD EXPECT: Some feelings of bloating in the abdomen. Passage of more gas than usual.  Walking can help get rid of the air that was put into your GI tract during the procedure and reduce the bloating. If you had a lower endoscopy (such as a colonoscopy or flexible sigmoidoscopy) you may notice spotting of blood in your stool or on the toilet paper. If you underwent a bowel prep for your procedure, you may not have a normal bowel movement for a few days.  Please Note:  You might notice some irritation and congestion in your nose or some drainage.  This is from the oxygen used during your procedure.  There is no need for concern and it should clear up in a day or so.  SYMPTOMS TO REPORT IMMEDIATELY:   Following lower endoscopy (colonoscopy or flexible sigmoidoscopy):  Excessive amounts of blood in the stool  Significant tenderness or worsening of abdominal pains  Swelling of the abdomen that is new, acute  Fever of 100F or higher  For urgent or emergent issues, a gastroenterologist can be reached at any hour by calling [phone].   DIET:  We do recommend a small meal at first, but then you may proceed to your regular diet.  Drink plenty of fluids but you should avoid alcoholic beverages for 24 hours.  ACTIVITY:  You should plan to take it easy for the rest of today and you should NOT DRIVE or use heavy machinery until tomorrow (because of the sedation medicines used during the test).     FOLLOW UP: Our staff will call the number listed on your records the next business day following your procedure to check on you and address any questions or concerns that you may have regarding the information given to you following your procedure. If we do not reach you, we will leave a message.  However, if you are feeling well and you are not experiencing any problems, there is no need to return our call.  We will assume that you have returned to your regular daily activities without incident.  If any biopsies were taken you will be contacted by phone or by letter within the next 1-3 weeks.  Please call us at [phone] if you have not heard about the biopsies in 3 weeks.    SIGNATURES/CONFIDENTIALITY: You and/or your care partner have signed paperwork which will be entered into your electronic medical record.  These signatures attest to the fact that that the information above on your After Visit Summary has been reviewed and is understood.  Full responsibility of the confidentiality of this discharge information lies with you and/or your care-partner.  Polyps, diverticulosis-handouts given  No recommended repeat colonoscopy at this time.  No aspirin, aspirin products or NSAIDs (ibuprofen, motrin, advil, aleve, etc)for 3 days.

## 2016-05-16 NOTE — Progress Notes (Signed)
To PACU, vss patent aw report to rn 

## 2016-05-17 ENCOUNTER — Telehealth: Payer: Self-pay | Admitting: *Deleted

## 2016-05-17 NOTE — Telephone Encounter (Signed)
  Follow up Call-  Call back number 05/16/2016  Post procedure Call Back phone  # [phone]  Permission to leave phone message Yes  Some recent data might be hidden     Patient questions:  Do you have a fever, pain , or abdominal swelling? No. Pain Score  0 *  Have you tolerated food without any problems? Yes.    Have you been able to return to your normal activities? Yes.    Do you have any questions about your discharge instructions: Diet   No. Medications  No. Follow up visit  No.  Do you have questions or concerns about your Care? No.  Actions: * If pain score is 4 or above: No action needed, pain <4.

## 2016-05-25 ENCOUNTER — Encounter: Payer: Self-pay | Admitting: Gastroenterology

## 2016-06-06 ENCOUNTER — Other Ambulatory Visit: Payer: Self-pay | Admitting: Family Medicine

## 2016-07-12 DIAGNOSIS — H35352 Cystoid macular degeneration, left eye: Secondary | ICD-10-CM | POA: Diagnosis not present

## 2016-07-12 DIAGNOSIS — H401131 Primary open-angle glaucoma, bilateral, mild stage: Secondary | ICD-10-CM | POA: Diagnosis not present

## 2016-07-12 DIAGNOSIS — H2511 Age-related nuclear cataract, right eye: Secondary | ICD-10-CM | POA: Diagnosis not present

## 2016-07-12 DIAGNOSIS — Z961 Presence of intraocular lens: Secondary | ICD-10-CM | POA: Diagnosis not present

## 2016-09-21 ENCOUNTER — Other Ambulatory Visit: Payer: Self-pay | Admitting: Family Medicine

## 2016-10-27 DIAGNOSIS — D1801 Hemangioma of skin and subcutaneous tissue: Secondary | ICD-10-CM | POA: Diagnosis not present

## 2016-10-27 DIAGNOSIS — D225 Melanocytic nevi of trunk: Secondary | ICD-10-CM | POA: Diagnosis not present

## 2016-10-27 DIAGNOSIS — L821 Other seborrheic keratosis: Secondary | ICD-10-CM | POA: Diagnosis not present

## 2016-10-27 DIAGNOSIS — Z85828 Personal history of other malignant neoplasm of skin: Secondary | ICD-10-CM | POA: Diagnosis not present

## 2016-10-27 DIAGNOSIS — L853 Xerosis cutis: Secondary | ICD-10-CM | POA: Diagnosis not present

## 2016-12-06 DIAGNOSIS — T1511XA Foreign body in conjunctival sac, right eye, initial encounter: Secondary | ICD-10-CM | POA: Diagnosis not present

## 2016-12-06 DIAGNOSIS — S0501XA Injury of conjunctiva and corneal abrasion without foreign body, right eye, initial encounter: Secondary | ICD-10-CM | POA: Diagnosis not present

## 2016-12-22 ENCOUNTER — Other Ambulatory Visit: Payer: Self-pay | Admitting: Family Medicine

## 2016-12-26 DIAGNOSIS — N401 Enlarged prostate with lower urinary tract symptoms: Secondary | ICD-10-CM | POA: Diagnosis not present

## 2016-12-27 ENCOUNTER — Other Ambulatory Visit: Payer: Self-pay | Admitting: Family Medicine

## 2016-12-27 ENCOUNTER — Other Ambulatory Visit: Payer: Medicare Other

## 2016-12-27 DIAGNOSIS — I10 Essential (primary) hypertension: Secondary | ICD-10-CM | POA: Diagnosis not present

## 2016-12-27 DIAGNOSIS — Z125 Encounter for screening for malignant neoplasm of prostate: Secondary | ICD-10-CM | POA: Diagnosis not present

## 2016-12-27 DIAGNOSIS — Z1329 Encounter for screening for other suspected endocrine disorder: Secondary | ICD-10-CM | POA: Diagnosis not present

## 2016-12-27 DIAGNOSIS — N400 Enlarged prostate without lower urinary tract symptoms: Secondary | ICD-10-CM

## 2016-12-27 DIAGNOSIS — E783 Hyperchylomicronemia: Secondary | ICD-10-CM | POA: Diagnosis not present

## 2016-12-27 LAB — CBC WITH DIFFERENTIAL/PLATELET
BASOS ABS: 52 {cells}/uL (ref 0–200)
BASOS PCT: 1 %
EOS PCT: 3 %
Eosinophils Absolute: 156 cells/uL (ref 15–500)
HCT: 45.2 % (ref 38.5–50.0)
HEMOGLOBIN: 15.3 g/dL (ref 13.0–17.0)
LYMPHS ABS: 1560 {cells}/uL (ref 850–3900)
Lymphocytes Relative: 30 %
MCH: 31.3 pg (ref 27.0–33.0)
MCHC: 33.8 g/dL (ref 32.0–36.0)
MCV: 92.4 fL (ref 80.0–100.0)
MONOS PCT: 9 %
MPV: 8.9 fL (ref 7.5–12.5)
Monocytes Absolute: 468 cells/uL (ref 200–950)
NEUTROS ABS: 2964 {cells}/uL (ref 1500–7800)
Neutrophils Relative %: 57 %
PLATELETS: 172 10*3/uL (ref 140–400)
RBC: 4.89 MIL/uL (ref 4.20–5.80)
RDW: 13.7 % (ref 11.0–15.0)
WBC: 5.2 10*3/uL (ref 3.8–10.8)

## 2016-12-27 LAB — COMPLETE METABOLIC PANEL WITH GFR
ALBUMIN: 4 g/dL (ref 3.6–5.1)
ALT: 16 U/L (ref 9–46)
AST: 17 U/L (ref 10–35)
Alkaline Phosphatase: 76 U/L (ref 40–115)
BILIRUBIN TOTAL: 0.5 mg/dL (ref 0.2–1.2)
BUN: 14 mg/dL (ref 7–25)
CO2: 24 mmol/L (ref 20–31)
Calcium: 8.9 mg/dL (ref 8.6–10.3)
Chloride: 108 mmol/L (ref 98–110)
Creat: 0.9 mg/dL (ref 0.70–1.18)
GFR, EST NON AFRICAN AMERICAN: 83 mL/min (ref >=60)
GFR, Est African American: >89 mL/min (ref >=60)
GLUCOSE: 93 mg/dL (ref 70–99)
Potassium: 4.5 mmol/L (ref 3.5–5.3)
SODIUM: 141 mmol/L (ref 135–146)
TOTAL PROTEIN: 6.4 g/dL (ref 6.1–8.1)

## 2016-12-27 LAB — LIPID PANEL
CHOL/HDL RATIO: 4.8 ratio (ref <5.0)
Cholesterol: 172 mg/dL (ref <200)
HDL: 36 mg/dL — ABNORMAL LOW (ref >40)
LDL CALC: 108 mg/dL — AB (ref <100)
Triglycerides: 142 mg/dL (ref <150)
VLDL: 28 mg/dL (ref <30)

## 2016-12-30 ENCOUNTER — Other Ambulatory Visit: Payer: Medicare Other

## 2017-01-03 ENCOUNTER — Ambulatory Visit (INDEPENDENT_AMBULATORY_CARE_PROVIDER_SITE_OTHER): Payer: Medicare Other | Admitting: Family Medicine

## 2017-01-03 ENCOUNTER — Encounter: Payer: Self-pay | Admitting: Family Medicine

## 2017-01-03 VITALS — BP 122/68 | HR 76 | Temp 97.5°F | Resp 14 | Ht 71.0 in | Wt 190.0 lb

## 2017-01-03 DIAGNOSIS — Z0000 Encounter for general adult medical examination without abnormal findings: Secondary | ICD-10-CM | POA: Diagnosis not present

## 2017-01-03 DIAGNOSIS — Z23 Encounter for immunization: Secondary | ICD-10-CM

## 2017-01-03 NOTE — Addendum Note (Signed)
Addended by: Shary Decamp B on: 01/03/2017 02:31 PM   Modules accepted: Orders

## 2017-01-03 NOTE — Progress Notes (Signed)
Subjective:    Patient ID: John Evans, male    DOB: March 08, 1941, 76 y.o.   MRN: [id number]  HPIHere today for complete physical exam. Had colonoscopy last year and found 2 benign polyps.  No further screening required.  He sees his urologist tomorrow.  Immunizations are listed below. He has had a shingles vaccine. 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 He is due for a booster on Pneumovax 23. Immunization History  Administered Date(s) Administered  . Pneumococcal Conjugate-13 09/05/2013  . Pneumococcal Polysaccharide-23 05/15/2002  . Td 08/15/1978   Only concern is some mild trouble falling asleep. He exercises regularly. He does not use any kind of white noise when he sleeps. He also does not use a hand. He also watches TV in the bedroom. We discussed this at length. I recommended discontinuing TV in the bedroom. I recommended sleeping with a fan to provide airflow and also to provide white noise.  Past Medical History:  Diagnosis Date  . BPH (benign prostatic hyperplasia)   . Gout   . Hyperlipidemia   . Hypertension   . Insomnia   . Obstructive sleep apnea   . Panic attacks    Past Surgical History:  Procedure Laterality Date  . BACK SURGERY    . EYE SURGERY     Current Outpatient Prescriptions on File Prior to Visit  Medication Sig Dispense Refill  . allopurinol (ZYLOPRIM) 100 MG tablet TAKE TWO (2) TABLETS BY MOUTH DAILY 60 tablet 1  . aspirin 81 MG tablet Take 81 mg by mouth daily.      . finasteride (PROSCAR) 5 MG tablet     . lisinopril (PRINIVIL,ZESTRIL) 10 MG tablet TAKE ONE (1) TABLET BY MOUTH EVERY DAY 90 tablet 3   Current Facility-Administered Medications on File Prior to Visit  Medication Dose Route Frequency Provider Last Rate Last Dose  . 0.9 %  sodium chloride infusion  500 mL Intravenous Continuous Doran Stabler, MD       Allergies  Allergen Reactions  . Latex Rash   Social  History   Social History  . Marital status: Married    Spouse name: N/A  . Number of children: N/A  . Years of education: N/A   Occupational History  . Not on file.   Social History Main Topics  . Smoking status: Never Smoker  . Smokeless tobacco: Never Used  . Alcohol use No  . Drug use: No  . Sexual activity: Yes     Comment: married, retired Software engineer   Other Topics Concern  . Not on file   Social History Narrative  . No narrative on file     Review of Systems  All other systems reviewed and are negative.      Objective:   Physical Exam  Constitutional: He is oriented to person, place, and time. He appears well-developed and well-nourished. No distress.  HENT:  Head: Normocephalic and atraumatic.  Right Ear: External ear normal.  Left Ear: External ear normal.  Nose: Nose normal.  Mouth/Throat: Oropharynx is clear and moist. No oropharyngeal exudate.  Eyes: Conjunctivae and EOM are normal. Pupils are equal, round, and reactive to light. Right eye exhibits no discharge. Left eye exhibits no discharge. No scleral icterus.  Neck: Normal range of motion. Neck supple. No JVD present. No tracheal deviation present. No thyromegaly present.  Cardiovascular: Normal rate, regular rhythm, normal heart sounds and intact distal  pulses.  Exam reveals no gallop and no friction rub.   No murmur heard. Pulmonary/Chest: Effort normal and breath sounds normal. No stridor. No respiratory distress. He has no wheezes. He has no rales. He exhibits no tenderness.  Abdominal: Soft. Bowel sounds are normal. He exhibits no distension and no mass. There is no tenderness. There is no rebound and no guarding.  Musculoskeletal: Normal range of motion. He exhibits no edema or tenderness.  Lymphadenopathy:    He has no cervical adenopathy.  Neurological: He is alert and oriented to person, place, and time. He has normal reflexes. No cranial nerve deficit. He exhibits normal muscle tone.  Coordination normal.  Skin: Skin is warm. No rash noted. He is not diaphoretic. No erythema. No pallor.  Psychiatric: He has a normal mood and affect. His behavior is normal. Judgment and thought content normal.  Vitals reviewed.        Assessment & Plan:  Routine general medical examination at a health care facility Physical exam today is completely normal. Cholesterol is acceptable given his age. Patient received a booster on Pneumovax 23. The remainder of his immunizations are up-to-date. Colon cancer screening is up-to-date. Patient will have his prostate checked tomorrow at his urologist office and therefore I defer the PSA at the present time. Routine anticipatory guidance is provided. He denies any falls or depression. Appointment on 12/27/2016  Component Date Value Ref Range Status  . WBC 12/27/2016 5.2  3.8 - 10.8 K/uL Final  . RBC 12/27/2016 4.89  4.20 - 5.80 MIL/uL Final  . Hemoglobin 12/27/2016 15.3  13.0 - 17.0 g/dL Final  . HCT 12/27/2016 45.2  38.5 - 50.0 % Final  . MCV 12/27/2016 92.4  80.0 - 100.0 fL Final  . MCH 12/27/2016 31.3  27.0 - 33.0 pg Final  . MCHC 12/27/2016 33.8  32.0 - 36.0 g/dL Final  . RDW 12/27/2016 13.7  11.0 - 15.0 % Final  . Platelets 12/27/2016 172  140 - 400 K/uL Final  . MPV 12/27/2016 8.9  7.5 - 12.5 fL Final  . Neutro Abs 12/27/2016 2964  1,500 - 7,800 cells/uL Final  . Lymphs Abs 12/27/2016 1560  850 - 3,900 cells/uL Final  . Monocytes Absolute 12/27/2016 468  200 - 950 cells/uL Final  . Eosinophils Absolute 12/27/2016 156  15 - 500 cells/uL Final  . Basophils Absolute 12/27/2016 52  0 - 200 cells/uL Final  . Neutrophils Relative % 12/27/2016 57  % Final  . Lymphocytes Relative 12/27/2016 30  % Final  . Monocytes Relative 12/27/2016 9  % Final  . Eosinophils Relative 12/27/2016 3  % Final  . Basophils Relative 12/27/2016 1  % Final  . Smear Review 12/27/2016 Criteria for review not met   Final  . Sodium 12/27/2016 141  135 - 146 mmol/L  Final  . Potassium 12/27/2016 4.5  3.5 - 5.3 mmol/L Final  . Chloride 12/27/2016 108  98 - 110 mmol/L Final  . CO2 12/27/2016 24  20 - 31 mmol/L Final  . Glucose, Bld 12/27/2016 93  70 - 99 mg/dL Final  . BUN 12/27/2016 14  7 - 25 mg/dL Final  . Creat 12/27/2016 0.90  0.70 - 1.18 mg/dL Final   Comment:   For patients > or = 77 years of age: The upper reference limit for Creatinine is approximately 13% higher for people identified as African-American.     . Total Bilirubin 12/27/2016 0.5  0.2 - 1.2 mg/dL Final  . Alkaline Phosphatase  12/27/2016 76  40 - 115 U/L Final  . AST 12/27/2016 17  10 - 35 U/L Final  . ALT 12/27/2016 16  9 - 46 U/L Final  . Total Protein 12/27/2016 6.4  6.1 - 8.1 g/dL Final  . Albumin 12/27/2016 4.0  3.6 - 5.1 g/dL Final  . Calcium 12/27/2016 8.9  8.6 - 10.3 mg/dL Final  . GFR, Est African American 12/27/2016 >89  >=60 mL/min Final  . GFR, Est Non African American 12/27/2016 83  >=60 mL/min Final  . Cholesterol 12/27/2016 172  <200 mg/dL Final  . Triglycerides 12/27/2016 142  <150 mg/dL Final  . HDL 12/27/2016 36* >40 mg/dL Final  . Total CHOL/HDL Ratio 12/27/2016 4.8  <5.0 Ratio Final  . VLDL 12/27/2016 28  <30 mg/dL Final  . LDL Cholesterol 12/27/2016 108* <100 mg/dL Final

## 2017-01-04 DIAGNOSIS — H2511 Age-related nuclear cataract, right eye: Secondary | ICD-10-CM | POA: Diagnosis not present

## 2017-01-04 DIAGNOSIS — H401131 Primary open-angle glaucoma, bilateral, mild stage: Secondary | ICD-10-CM | POA: Diagnosis not present

## 2017-01-04 DIAGNOSIS — Z961 Presence of intraocular lens: Secondary | ICD-10-CM | POA: Diagnosis not present

## 2017-01-04 DIAGNOSIS — N401 Enlarged prostate with lower urinary tract symptoms: Secondary | ICD-10-CM | POA: Diagnosis not present

## 2017-02-24 ENCOUNTER — Other Ambulatory Visit: Payer: Self-pay | Admitting: Family Medicine

## 2017-03-09 DIAGNOSIS — H2511 Age-related nuclear cataract, right eye: Secondary | ICD-10-CM | POA: Diagnosis not present

## 2017-03-09 DIAGNOSIS — Z961 Presence of intraocular lens: Secondary | ICD-10-CM | POA: Diagnosis not present

## 2017-03-09 DIAGNOSIS — H35372 Puckering of macula, left eye: Secondary | ICD-10-CM | POA: Diagnosis not present

## 2017-03-09 DIAGNOSIS — H4720 Unspecified optic atrophy: Secondary | ICD-10-CM | POA: Diagnosis not present

## 2017-06-22 ENCOUNTER — Telehealth: Payer: Self-pay | Admitting: Family Medicine

## 2017-06-27 ENCOUNTER — Encounter: Payer: Self-pay | Admitting: Family Medicine

## 2017-06-27 ENCOUNTER — Other Ambulatory Visit: Payer: Self-pay | Admitting: Family Medicine

## 2017-06-27 ENCOUNTER — Ambulatory Visit (INDEPENDENT_AMBULATORY_CARE_PROVIDER_SITE_OTHER): Payer: Medicare Other | Admitting: Family Medicine

## 2017-06-27 VITALS — BP 132/74 | HR 86 | Temp 97.9°F | Resp 14 | Ht 71.0 in | Wt 194.0 lb

## 2017-06-27 DIAGNOSIS — Z0189 Encounter for other specified special examinations: Secondary | ICD-10-CM | POA: Diagnosis not present

## 2017-06-27 DIAGNOSIS — G609 Hereditary and idiopathic neuropathy, unspecified: Secondary | ICD-10-CM

## 2017-06-27 MED ORDER — GABAPENTIN 300 MG PO CAPS: 300.0000 mg | ORAL_CAPSULE | Freq: Three times a day (TID) | ORAL | 3 refills | Status: DC

## 2017-06-27 NOTE — Progress Notes (Signed)
Subjective:    Patient ID: John Evans, male    DOB: Jan 11, 1941, 76 y.o.   MRN: [id number]  HPI Patient is here today complaining of burning stinging pins and needles in both feet but the left foot is by far the worst.  The sensations are limited to the plantar surfaces of the foot.  It is primarily distal to the midfoot.  Has been present for quite some time however it is severely impacting his ability to sleep at night.  He does have a history of back surgery secondary to a herniated disc in the remote past.  He had left-sided lumbar radiculopathy at that time but that was completely cured after his back surgery.  Past medical history is also significant for borderline vitamin B12 deficiency on lab work checked in 2015.  Patient has tried gabapentin in the past for lumbar radiculopathy with no benefit.  He also tried and failed amitriptyline. Past Medical History:  Diagnosis Date  . BPH (benign prostatic hyperplasia)   . Gout   . Hyperlipidemia   . Hypertension   . Insomnia   . Obstructive sleep apnea   . Panic attacks    Past Surgical History:  Procedure Laterality Date  . BACK SURGERY    . EYE SURGERY     Current Outpatient Medications on File Prior to Visit  Medication Sig Dispense Refill  . aspirin 81 MG tablet Take 81 mg by mouth daily.      . finasteride (PROSCAR) 5 MG tablet Take 5 mg daily by mouth.      Current Facility-Administered Medications on File Prior to Visit  Medication Dose Route Frequency Provider Last Rate Last Dose  . 0.9 %  sodium chloride infusion  500 mL Intravenous Continuous Doran Stabler, MD       Allergies  Allergen Reactions  . Latex Rash   Social History   Socioeconomic History  . Marital status: Married    Spouse name: Not on file  . Number of children: Not on file  . Years of education: Not on file  . Highest education level: Not on file  Social Needs  . Financial resource strain: Not on file  . Food insecurity - worry: Not  on file  . Food insecurity - inability: Not on file  . Transportation needs - medical: Not on file  . Transportation needs - non-medical: Not on file  Occupational History  . Not on file  Tobacco Use  . Smoking status: Never Smoker  . Smokeless tobacco: Never Used  Substance and Sexual Activity  . Alcohol use: No  . Drug use: No  . Sexual activity: Yes    Comment: married, retired Software engineer  Other Topics Concern  . Not on file  Social History Narrative  . Not on file     Review of Systems  All other systems reviewed and are negative.      Objective:   Physical Exam  Constitutional: He is oriented to person, place, and time. He appears well-developed and well-nourished. No distress.  HENT:  Head: Normocephalic and atraumatic.  Right Ear: External ear normal.  Left Ear: External ear normal.  Nose: Nose normal.  Mouth/Throat: Oropharynx is clear and moist. No oropharyngeal exudate.  Eyes: Conjunctivae and EOM are normal. Pupils are equal, round, and reactive to light. Right eye exhibits no discharge. Left eye exhibits no discharge. No scleral icterus.  Neck: Normal range of motion. Neck supple. No JVD present. No tracheal deviation present.  No thyromegaly present.  Cardiovascular: Normal rate, regular rhythm, normal heart sounds and intact distal pulses. Exam reveals no gallop and no friction rub.  No murmur heard. Pulmonary/Chest: Effort normal and breath sounds normal. No stridor. No respiratory distress. He has no wheezes. He has no rales. He exhibits no tenderness.  Abdominal: Soft. Bowel sounds are normal. He exhibits no distension and no mass. There is no tenderness. There is no rebound and no guarding.  Musculoskeletal: Normal range of motion. He exhibits no edema or tenderness.  Lymphadenopathy:    He has no cervical adenopathy.  Neurological: He is alert and oriented to person, place, and time. He has normal reflexes. No cranial nerve deficit. He exhibits normal  muscle tone. Coordination normal.  Skin: Skin is warm. No rash noted. He is not diaphoretic. No erythema. No pallor.  Psychiatric: He has a normal mood and affect. His behavior is normal. Judgment and thought content normal.  Vitals reviewed.  Physical exam today is significant for diminished sensation to 10 g monofilament left greater than right.  Patient is unable to appreciate a 10 g monofilament distal to the MTP joint on his left foot.  He also has diminished sensation to vibration as well as temperature.  Reflexes are 2/4 equal and symmetric in the lower extremities.  Muscle strength is 5/5 equal and symmetric in the lower extremities.  He has normal pulses in both feet and normal capillary refill.      Assessment & Plan:  Idiopathic peripheral neuropathy - Plan: Vitamin B12, Ambulatory referral to Physical Therapy, CANCELED: Vitamin B12, CANCELED: Ambulatory referral to Physical Therapy  Try the patient on 300 mg of gabapentin p.o. nightly.  Increase the medication as tolerated/as needed as well.  If not beneficial, consider Lyrica versus Lyrica plus Cymbalta.  Given his history of borderline B12 deficiency in the past, I will recheck a B12 level.  Patient would like a referral to a physical therapist who is benefited his daughter who also suffers from peripheral neuropathy which I will happily order.

## 2017-06-28 LAB — VITAMIN B12: VITAMIN B 12: 217 pg/mL (ref 200–1100)

## 2017-06-29 ENCOUNTER — Encounter: Payer: Self-pay | Admitting: Family Medicine

## 2017-06-29 DIAGNOSIS — M542 Cervicalgia: Secondary | ICD-10-CM | POA: Diagnosis not present

## 2017-06-29 DIAGNOSIS — M79671 Pain in right foot: Secondary | ICD-10-CM | POA: Diagnosis not present

## 2017-06-29 DIAGNOSIS — M79672 Pain in left foot: Secondary | ICD-10-CM | POA: Diagnosis not present

## 2017-06-29 DIAGNOSIS — M545 Low back pain: Secondary | ICD-10-CM | POA: Diagnosis not present

## 2017-07-13 DIAGNOSIS — M545 Low back pain: Secondary | ICD-10-CM | POA: Diagnosis not present

## 2017-07-13 DIAGNOSIS — M79671 Pain in right foot: Secondary | ICD-10-CM | POA: Diagnosis not present

## 2017-07-13 DIAGNOSIS — H2511 Age-related nuclear cataract, right eye: Secondary | ICD-10-CM | POA: Diagnosis not present

## 2017-07-13 DIAGNOSIS — H401131 Primary open-angle glaucoma, bilateral, mild stage: Secondary | ICD-10-CM | POA: Diagnosis not present

## 2017-07-13 DIAGNOSIS — M542 Cervicalgia: Secondary | ICD-10-CM | POA: Diagnosis not present

## 2017-07-13 DIAGNOSIS — Z961 Presence of intraocular lens: Secondary | ICD-10-CM | POA: Diagnosis not present

## 2017-07-13 DIAGNOSIS — M79672 Pain in left foot: Secondary | ICD-10-CM | POA: Diagnosis not present

## 2017-07-13 DIAGNOSIS — H35352 Cystoid macular degeneration, left eye: Secondary | ICD-10-CM | POA: Diagnosis not present

## 2017-07-18 DIAGNOSIS — M545 Low back pain: Secondary | ICD-10-CM | POA: Diagnosis not present

## 2017-07-18 DIAGNOSIS — M79672 Pain in left foot: Secondary | ICD-10-CM | POA: Diagnosis not present

## 2017-07-18 DIAGNOSIS — M79671 Pain in right foot: Secondary | ICD-10-CM | POA: Diagnosis not present

## 2017-07-18 DIAGNOSIS — M542 Cervicalgia: Secondary | ICD-10-CM | POA: Diagnosis not present

## 2017-07-20 DIAGNOSIS — M542 Cervicalgia: Secondary | ICD-10-CM | POA: Diagnosis not present

## 2017-07-20 DIAGNOSIS — M79671 Pain in right foot: Secondary | ICD-10-CM | POA: Diagnosis not present

## 2017-07-20 DIAGNOSIS — M545 Low back pain: Secondary | ICD-10-CM | POA: Diagnosis not present

## 2017-07-20 DIAGNOSIS — M79672 Pain in left foot: Secondary | ICD-10-CM | POA: Diagnosis not present

## 2017-07-27 DIAGNOSIS — M79671 Pain in right foot: Secondary | ICD-10-CM | POA: Diagnosis not present

## 2017-07-27 DIAGNOSIS — M79672 Pain in left foot: Secondary | ICD-10-CM | POA: Diagnosis not present

## 2017-07-27 DIAGNOSIS — M545 Low back pain: Secondary | ICD-10-CM | POA: Diagnosis not present

## 2017-07-27 DIAGNOSIS — M542 Cervicalgia: Secondary | ICD-10-CM | POA: Diagnosis not present

## 2017-07-31 DIAGNOSIS — M545 Low back pain: Secondary | ICD-10-CM | POA: Diagnosis not present

## 2017-07-31 DIAGNOSIS — M79671 Pain in right foot: Secondary | ICD-10-CM | POA: Diagnosis not present

## 2017-07-31 DIAGNOSIS — M542 Cervicalgia: Secondary | ICD-10-CM | POA: Diagnosis not present

## 2017-07-31 DIAGNOSIS — M79672 Pain in left foot: Secondary | ICD-10-CM | POA: Diagnosis not present

## 2017-10-06 DIAGNOSIS — L821 Other seborrheic keratosis: Secondary | ICD-10-CM | POA: Diagnosis not present

## 2017-10-06 DIAGNOSIS — D1801 Hemangioma of skin and subcutaneous tissue: Secondary | ICD-10-CM | POA: Diagnosis not present

## 2017-10-06 DIAGNOSIS — L570 Actinic keratosis: Secondary | ICD-10-CM | POA: Diagnosis not present

## 2017-10-06 DIAGNOSIS — L812 Freckles: Secondary | ICD-10-CM | POA: Diagnosis not present

## 2017-12-22 DIAGNOSIS — N401 Enlarged prostate with lower urinary tract symptoms: Secondary | ICD-10-CM | POA: Diagnosis not present

## 2018-01-12 DIAGNOSIS — H401131 Primary open-angle glaucoma, bilateral, mild stage: Secondary | ICD-10-CM | POA: Diagnosis not present

## 2018-01-12 DIAGNOSIS — Z961 Presence of intraocular lens: Secondary | ICD-10-CM | POA: Diagnosis not present

## 2018-01-12 DIAGNOSIS — H35352 Cystoid macular degeneration, left eye: Secondary | ICD-10-CM | POA: Diagnosis not present

## 2018-01-12 DIAGNOSIS — H2511 Age-related nuclear cataract, right eye: Secondary | ICD-10-CM | POA: Diagnosis not present

## 2018-05-17 DIAGNOSIS — H35372 Puckering of macula, left eye: Secondary | ICD-10-CM | POA: Diagnosis not present

## 2018-05-17 DIAGNOSIS — H4720 Unspecified optic atrophy: Secondary | ICD-10-CM | POA: Diagnosis not present

## 2018-05-17 DIAGNOSIS — H2511 Age-related nuclear cataract, right eye: Secondary | ICD-10-CM | POA: Diagnosis not present

## 2018-05-17 DIAGNOSIS — Z961 Presence of intraocular lens: Secondary | ICD-10-CM | POA: Diagnosis not present

## 2018-06-05 ENCOUNTER — Other Ambulatory Visit: Payer: Self-pay

## 2018-06-05 NOTE — Patient Outreach (Signed)
[facility]) Care Management  06/05/2018  [name] 1941/07/24 [id number]   Medication Adherence call to Mr. John Evans left a message for patient to call back patient is due on Lisinopril 10 mg. Mr. John Evans is showing past due under [place].   [facility] Management Direct Dial [phone]  Fax [phone] [email or web address]

## 2018-07-05 ENCOUNTER — Ambulatory Visit
Admission: RE | Admit: 2018-07-05 | Discharge: 2018-07-05 | Disposition: A | Discharge status: Home / Self Care | Payer: Medicare Other | Source: Ambulatory Visit | Attending: Family Medicine | Admitting: Family Medicine

## 2018-07-05 ENCOUNTER — Other Ambulatory Visit: Payer: Medicare Other

## 2018-07-05 ENCOUNTER — Ambulatory Visit (INDEPENDENT_AMBULATORY_CARE_PROVIDER_SITE_OTHER): Payer: Medicare Other | Admitting: Family Medicine

## 2018-07-05 ENCOUNTER — Encounter: Payer: Self-pay | Admitting: Family Medicine

## 2018-07-05 VITALS — BP 120/80 | HR 66 | Temp 98.0°F | Resp 16 | Ht 71.0 in | Wt 200.0 lb

## 2018-07-05 DIAGNOSIS — R1032 Left lower quadrant pain: Secondary | ICD-10-CM

## 2018-07-05 DIAGNOSIS — R079 Chest pain, unspecified: Secondary | ICD-10-CM

## 2018-07-05 LAB — CBC WITH DIFFERENTIAL/PLATELET
BASOS ABS: 60 {cells}/uL (ref 0–200)
Basophils Relative: 1 %
EOS ABS: 150 {cells}/uL (ref 15–500)
EOS PCT: 2.5 %
HEMATOCRIT: 47.3 % (ref 38.5–50.0)
HEMOGLOBIN: 16.6 g/dL (ref 13.2–17.1)
Lymphs Abs: 1716 cells/uL (ref 850–3900)
MCH: 31.4 pg (ref 27.0–33.0)
MCHC: 35.1 g/dL (ref 32.0–36.0)
MCV: 89.6 fL (ref 80.0–100.0)
MPV: 9.6 fL (ref 7.5–12.5)
Monocytes Relative: 10 %
Neutro Abs: 3474 cells/uL (ref 1500–7800)
Neutrophils Relative %: 57.9 %
Platelets: 196 10*3/uL (ref 140–400)
RBC: 5.28 10*6/uL (ref 4.20–5.80)
RDW: 12.7 % (ref 11.0–15.0)
Total Lymphocyte: 28.6 %
WBC: 6 10*3/uL (ref 3.8–10.8)
WBCMIX: 600 {cells}/uL (ref 200–950)

## 2018-07-05 LAB — COMPLETE METABOLIC PANEL WITH GFR
AG Ratio: 1.8 (calc) (ref 1.0–2.5)
ALBUMIN MSPROF: 4.3 g/dL (ref 3.6–5.1)
ALT: 13 U/L (ref 9–46)
AST: 15 U/L (ref 10–35)
Alkaline phosphatase (APISO): 70 U/L (ref 40–115)
BUN: 16 mg/dL (ref 7–25)
CALCIUM: 9.5 mg/dL (ref 8.6–10.3)
CO2: 28 mmol/L (ref 20–32)
CREATININE: 1.01 mg/dL (ref 0.70–1.18)
Chloride: 105 mmol/L (ref 98–110)
GFR, EST AFRICAN AMERICAN: 82 mL/min/{1.73_m2} (ref >=60)
GFR, EST NON AFRICAN AMERICAN: 71 mL/min/{1.73_m2} (ref >=60)
GLOBULIN: 2.4 g/dL (ref 1.9–3.7)
Glucose, Bld: 102 mg/dL — ABNORMAL HIGH (ref 65–99)
Potassium: 4.5 mmol/L (ref 3.5–5.3)
SODIUM: 143 mmol/L (ref 135–146)
Total Bilirubin: 0.6 mg/dL (ref 0.2–1.2)
Total Protein: 6.7 g/dL (ref 6.1–8.1)

## 2018-07-05 MED ORDER — CIPROFLOXACIN HCL 500 MG PO TABS: 500.0000 mg | ORAL_TABLET | Freq: Two times a day (BID) | ORAL | 0 refills | Status: DC

## 2018-07-05 MED ORDER — METRONIDAZOLE 500 MG PO TABS: 500.0000 mg | ORAL_TABLET | Freq: Two times a day (BID) | ORAL | 0 refills | Status: DC

## 2018-07-05 NOTE — Progress Notes (Signed)
Subjective:    Patient ID: John Evans, male    DOB: Jan 13, 1941, 77 y.o.   MRN: [id number]  HPI Patient presents today complaining of left lower quadrant abdominal pain that he states is been off and on for the last 3 weeks however his steadily worsened.  The pain is definitely in the left lower quadrant.  He is tender to palpation in that area there is some voluntary guarding.  He has normal bowel sounds.  There is no rebound.  He denies any fever.  He denies any melena.  He denies any hematochezia.  However he is only having a bowel movement every other day.  He states his normal bowel movement and there has not been any changes in the caliber or consistency.  He denies any dysuria.  He denies any hematuria.  He denies any frequency or urgency.  He does have some mild pain in his left upper quadrant just below his ribs.  There is no substernal chest pain or shortness of breath although he does have a cough that has been chronic and consistent that he attributes to his ACE inhibitor.  He denies any weight loss.  He denies any falls or injuries. Past Medical History:  Diagnosis Date  . BPH (benign prostatic hyperplasia)   . Gout   . Hyperlipidemia   . Hypertension   . Insomnia   . Obstructive sleep apnea   . Panic attacks    Past Surgical History:  Procedure Laterality Date  . BACK SURGERY    . EYE SURGERY     Current Outpatient Medications on File Prior to Visit  Medication Sig Dispense Refill  . aspirin 81 MG tablet Take 81 mg by mouth daily.      . Cholecalciferol (VITAMIN D3) 50 MCG (2000 UT) TABS Take by mouth.    . Cyanocobalamin (VITAMIN B 12 PO) Take by mouth.    . finasteride (PROSCAR) 5 MG tablet Take 5 mg daily by mouth.     Marland Kitchen lisinopril (PRINIVIL,ZESTRIL) 10 MG tablet TAKE ONE (1) TABLET BY MOUTH EVERY DAY 90 tablet 3   Current Facility-Administered Medications on File Prior to Visit  Medication Dose Route Frequency Provider Last Rate Last Dose  . 0.9 %  sodium  chloride infusion  500 mL Intravenous Continuous Doran Stabler, MD       Allergies  Allergen Reactions  . Latex Rash   Social History   Socioeconomic History  . Marital status: Married    Spouse name: Not on file  . Number of children: Not on file  . Years of education: Not on file  . Highest education level: Not on file  Occupational History  . Not on file  Social Needs  . Financial resource strain: Not on file  . Food insecurity:    Worry: Not on file    Inability: Not on file  . Transportation needs:    Medical: Not on file    Non-medical: Not on file  Tobacco Use  . Smoking status: Never Smoker  . Smokeless tobacco: Never Used  Substance and Sexual Activity  . Alcohol use: No  . Drug use: No  . Sexual activity: Yes    Comment: married, retired Software engineer  Lifestyle  . Physical activity:    Days per week: Not on file    Minutes per session: Not on file  . Stress: Not on file  Relationships  . Social connections:    Talks on phone: Not on  file    Gets together: Not on file    Attends religious service: Not on file    Active member of club or organization: Not on file    Attends meetings of clubs or organizations: Not on file    Relationship status: Not on file  . Intimate partner violence:    Fear of current or ex partner: Not on file    Emotionally abused: Not on file    Physically abused: Not on file    Forced sexual activity: Not on file  Other Topics Concern  . Not on file  Social History Narrative  . Not on file     Review of Systems  All other systems reviewed and are negative.      Objective:   Physical Exam  Constitutional: He is oriented to person, place, and time. He appears well-developed and well-nourished. No distress.  HENT:  Head: Normocephalic and atraumatic.  Right Ear: External ear normal.  Left Ear: External ear normal.  Nose: Nose normal.  Mouth/Throat: Oropharynx is clear and moist. No oropharyngeal exudate.  Eyes:  Pupils are equal, round, and reactive to light. Conjunctivae and EOM are normal. Right eye exhibits no discharge. Left eye exhibits no discharge. No scleral icterus.  Neck: Normal range of motion. Neck supple. No JVD present. No tracheal deviation present. No thyromegaly present.  Cardiovascular: Normal rate, regular rhythm, normal heart sounds and intact distal pulses. Exam reveals no gallop and no friction rub.  No murmur heard. Pulmonary/Chest: Effort normal and breath sounds normal. No stridor. No respiratory distress. He has no decreased breath sounds. He has no wheezes. He has no rhonchi. He has no rales. He exhibits tenderness.    Abdominal: Soft. Bowel sounds are normal. He exhibits no distension and no mass. There is tenderness. There is no rigidity, no rebound and no guarding.    Musculoskeletal: Normal range of motion. He exhibits no edema or tenderness.  Lymphadenopathy:    He has no cervical adenopathy.  Neurological: He is alert and oriented to person, place, and time. He has normal reflexes. No cranial nerve deficit. He exhibits normal muscle tone. Coordination normal.  Skin: Skin is warm. No rash noted. He is not diaphoretic. No erythema. No pallor.  Psychiatric: He has a normal mood and affect. His behavior is normal. Judgment and thought content normal.  Vitals reviewed.   Assessment & Plan:  Chest pain, unspecified type - Plan: DG Chest 2 View  LLQ pain - Plan: CBC with Differential/Platelet, COMPLETE METABOLIC PANEL WITH GFR, Fecal Globin By Immunochemistry, ciprofloxacin (CIPRO) 500 MG tablet, metroNIDAZOLE (FLAGYL) 500 MG tablet  Given the constant cough the patient has had, and the mild tenderness under his left ribs I will obtain a chest x-ray.  However I believe his left lower quadrant pain is likely diverticulitis given the pain in the location of the pain in the tenderness with palpation.  Therefore I will start the patient on Cipro 500 mg p.o. twice daily for 10  days as well as Flagyl 500 mg p.o. twice daily for 10 days.  I will obtain a CBC and a CMP along with a fecal occult blood test.  I will also schedule patient for a CT scan of the abdomen and pelvis if the pain is not improving on the antibiotics next week.

## 2018-07-06 LAB — FECAL GLOBIN BY IMMUNOCHEMISTRY
FECAL GLOBIN RESULT:: NOT DETECTED
MICRO NUMBER:: 91405104
SPECIMEN QUALITY:: ADEQUATE

## 2018-07-10 ENCOUNTER — Telehealth: Payer: Self-pay | Admitting: Family Medicine

## 2018-07-10 NOTE — Telephone Encounter (Signed)
Pt called [facility] stating that the medications you give him worked great and he cancelled his scan. He just want to let you know.

## 2018-07-11 ENCOUNTER — Other Ambulatory Visit: Payer: Medicare Other

## 2018-07-17 ENCOUNTER — Other Ambulatory Visit: Payer: Self-pay | Admitting: Family Medicine

## 2018-07-17 ENCOUNTER — Ambulatory Visit: Payer: Medicare Other | Admitting: Gastroenterology

## 2018-07-17 ENCOUNTER — Encounter: Payer: Self-pay | Admitting: Gastroenterology

## 2018-07-17 VITALS — BP 126/80 | HR 88 | Ht 71.0 in | Wt 195.5 lb

## 2018-07-17 DIAGNOSIS — R109 Unspecified abdominal pain: Secondary | ICD-10-CM | POA: Diagnosis not present

## 2018-07-17 DIAGNOSIS — K5732 Diverticulitis of large intestine without perforation or abscess without bleeding: Secondary | ICD-10-CM

## 2018-07-17 DIAGNOSIS — R1032 Left lower quadrant pain: Secondary | ICD-10-CM

## 2018-07-17 MED ORDER — AMOXICILLIN-POT CLAVULANATE 875-125 MG PO TABS: 1.0000 | ORAL_TABLET | Freq: Two times a day (BID) | ORAL | 0 refills | Status: DC

## 2018-07-17 NOTE — Patient Instructions (Signed)
We have sent the following medications to your pharmacy for you to pick up at your convenience: Augmentin.   Take Florastor twice daily x 1 month.   Call our office after your CT scan if your symptoms have gotten worse.  Thank you for choosing me and [facility].  Pricilla Riffle. Dagoberto Ligas., MD., Marval Regal

## 2018-07-17 NOTE — Progress Notes (Signed)
History of Present Illness: This is a 77 year old male with left sided abdominal pain. He requests to change gastroenterologist from Dr. Loletha Carrow to me.  He is accompanied by his daughter.  He relates that one month ago he developed left mid abdominal pain that persisted for several days.  It was not changed with meals or bowel movements.  He denies fevers or any other associated symptoms except for slightly looser stools since taking Cipro and Flagyl.  He was evaluated by his PCP, Dr. Dennard Schaumann, and placed on a 10-day course of Cipro and Flagyl for presumed diverticulitis.  His symptoms improved but did not abate.  He notes a persistent, mild, dull left mid abdomen pain. CT scan was recommended but has not yet been performed. Denies weight loss, constipation, diarrhea, change in stool caliber, melena, hematochezia, nausea, vomiting, dysphagia, reflux symptoms, chest pain.  CMP, CBC, FIT in 06/2018: normal, negative  Colonoscopy 05/2016 - Two 2 to 4 mm polyps in the ascending colon, removed piecemeal using a cold biopsy forceps. Resected and retrieved. (tubular adenoma and benign mucosa) - Diverticulosis in the left colon. - The examination was otherwise normal on direct and retroflexion views.    Allergies  Allergen Reactions  . Latex Rash   Outpatient Medications Prior to Visit  Medication Sig Dispense Refill  . aspirin 81 MG tablet Take 81 mg by mouth daily.      . Cholecalciferol (VITAMIN D3) 50 MCG (2000 UT) TABS Take by mouth.    . Cyanocobalamin (VITAMIN B 12 PO) Take by mouth.    . finasteride (PROSCAR) 5 MG tablet Take 5 mg daily by mouth.     Marland Kitchen lisinopril (PRINIVIL,ZESTRIL) 10 MG tablet TAKE ONE (1) TABLET BY MOUTH EVERY DAY 90 tablet 3  . ciprofloxacin (CIPRO) 500 MG tablet Take 1 tablet (500 mg total) by mouth 2 (two) times daily. 20 tablet 0  . metroNIDAZOLE (FLAGYL) 500 MG tablet Take 1 tablet (500 mg total) by mouth 2 (two) times daily. 20 tablet 0   Facility-Administered  Medications Prior to Visit  Medication Dose Route Frequency Provider Last Rate Last Dose  . 0.9 %  sodium chloride infusion  500 mL Intravenous Continuous Doran Stabler, MD       Past Medical History:  Diagnosis Date  . BPH (benign prostatic hyperplasia)   . Gout   . Hyperlipidemia   . Hypertension   . Insomnia   . Obstructive sleep apnea   . Panic attacks    Past Surgical History:  Procedure Laterality Date  . BACK SURGERY    . EYE SURGERY     Social History   Socioeconomic History  . Marital status: Married    Spouse name: Not on file  . Number of children: Not on file  . Years of education: Not on file  . Highest education level: Not on file  Occupational History  . Not on file  Social Needs  . Financial resource strain: Not on file  . Food insecurity:    Worry: Not on file    Inability: Not on file  . Transportation needs:    Medical: Not on file    Non-medical: Not on file  Tobacco Use  . Smoking status: Never Smoker  . Smokeless tobacco: Never Used  Substance and Sexual Activity  . Alcohol use: No  . Drug use: No  . Sexual activity: Yes    Comment: married, retired Software engineer  Lifestyle  . Physical activity:  Days per week: Not on file    Minutes per session: Not on file  . Stress: Not on file  Relationships  . Social connections:    Talks on phone: Not on file    Gets together: Not on file    Attends religious service: Not on file    Active member of club or organization: Not on file    Attends meetings of clubs or organizations: Not on file    Relationship status: Not on file  Other Topics Concern  . Not on file  Social History Narrative  . Not on file   Family History  Problem Relation Age of Onset  . Cancer Father        throat  . Esophageal cancer Father   . Diabetes Paternal Grandmother   . Colon cancer Neg Hx        Current Medications, Allergies, Past Medical History, Past Surgical History, Family History and Social  History were reviewed in [employer] record.  Physical Exam: General: Well developed, well nourished, no acute distress Head: Normocephalic and atraumatic Eyes:  sclerae anicteric, EOMI Ears: Normal auditory acuity Mouth: No deformity or lesions Lungs: Clear throughout to auscultation Heart: Regular rate and rhythm; no murmurs, rubs or bruits Abdomen: Soft, mild left mid tenderness to deep palpation and non distended. No masses, hepatosplenomegaly or hernias noted. Normal Bowel sounds Rectal: Not done Musculoskeletal: Symmetrical with no gross deformities  Pulses:  Normal pulses noted Extremities: No clubbing, cyanosis, edema or deformities noted Neurological: Alert oriented x 4, grossly nonfocal Psychological:  Alert and cooperative. Normal mood and affect   Assessment and Recommendations:  1. Persistent left sided abdominal pain c/w partially treated diverticulitis.  Rule out diverticular abscess and other disorders.  Augmentin 875 mg p.o. twice daily for 10 days.  Florastor twice daily for 1 month.  Proceed with abdominal/pelvic CT as scheduled.  I addressed the patient's and his daughter's questions to their satisfaction.  Ample time was allowed for questions and discussion.  I advised him to call if his symptoms do not resolve or worsen.  Advised him to call after his CT scan is completed.  2.  Personal history of adenomatous colon polyps.  Surveillance colonoscopy is up-to-date with no plans for future surveillance colonoscopies due to age.

## 2018-07-21 ENCOUNTER — Ambulatory Visit
Admission: RE | Admit: 2018-07-21 | Discharge: 2018-07-21 | Disposition: A | Discharge status: Home / Self Care | Payer: Medicare Other | Source: Ambulatory Visit | Attending: Family Medicine | Admitting: Family Medicine

## 2018-07-21 DIAGNOSIS — K4090 Unilateral inguinal hernia, without obstruction or gangrene, not specified as recurrent: Secondary | ICD-10-CM | POA: Diagnosis not present

## 2018-07-21 DIAGNOSIS — R1032 Left lower quadrant pain: Secondary | ICD-10-CM

## 2018-07-21 DIAGNOSIS — K7689 Other specified diseases of liver: Secondary | ICD-10-CM | POA: Diagnosis not present

## 2018-07-21 MED ORDER — IOPAMIDOL (ISOVUE-300) INJECTION 61%
100.0000 mL | Freq: Once | INTRAVENOUS | Status: AC | PRN
  Administered 2018-07-21: 100 mL via INTRAVENOUS

## 2018-09-24 DIAGNOSIS — H2511 Age-related nuclear cataract, right eye: Secondary | ICD-10-CM | POA: Diagnosis not present

## 2018-09-24 DIAGNOSIS — Z961 Presence of intraocular lens: Secondary | ICD-10-CM | POA: Diagnosis not present

## 2018-09-24 DIAGNOSIS — H401131 Primary open-angle glaucoma, bilateral, mild stage: Secondary | ICD-10-CM | POA: Diagnosis not present

## 2018-09-24 DIAGNOSIS — H35352 Cystoid macular degeneration, left eye: Secondary | ICD-10-CM | POA: Diagnosis not present

## 2018-10-12 ENCOUNTER — Other Ambulatory Visit: Payer: Self-pay | Admitting: Family Medicine

## 2018-10-12 DIAGNOSIS — D1801 Hemangioma of skin and subcutaneous tissue: Secondary | ICD-10-CM | POA: Diagnosis not present

## 2018-10-12 DIAGNOSIS — L821 Other seborrheic keratosis: Secondary | ICD-10-CM | POA: Diagnosis not present

## 2018-10-12 DIAGNOSIS — L814 Other melanin hyperpigmentation: Secondary | ICD-10-CM | POA: Diagnosis not present

## 2018-10-12 DIAGNOSIS — D2271 Melanocytic nevi of right lower limb, including hip: Secondary | ICD-10-CM | POA: Diagnosis not present

## 2018-10-12 DIAGNOSIS — D225 Melanocytic nevi of trunk: Secondary | ICD-10-CM | POA: Diagnosis not present

## 2018-12-18 ENCOUNTER — Ambulatory Visit: Payer: Self-pay

## 2018-12-18 ENCOUNTER — Ambulatory Visit (INDEPENDENT_AMBULATORY_CARE_PROVIDER_SITE_OTHER): Payer: Medicare Other

## 2018-12-18 ENCOUNTER — Telehealth: Payer: Self-pay | Admitting: Orthopaedic Surgery

## 2018-12-18 ENCOUNTER — Encounter: Payer: Self-pay | Admitting: Orthopaedic Surgery

## 2018-12-18 ENCOUNTER — Ambulatory Visit: Payer: Medicare Other | Admitting: Orthopaedic Surgery

## 2018-12-18 ENCOUNTER — Other Ambulatory Visit: Payer: Self-pay | Admitting: Orthopedic Surgery

## 2018-12-18 ENCOUNTER — Telehealth: Payer: Self-pay | Admitting: *Deleted

## 2018-12-18 ENCOUNTER — Other Ambulatory Visit: Payer: Self-pay

## 2018-12-18 VITALS — BP 153/80 | HR 67 | Ht 71.0 in | Wt 190.0 lb

## 2018-12-18 DIAGNOSIS — G8929 Other chronic pain: Secondary | ICD-10-CM

## 2018-12-18 DIAGNOSIS — M545 Low back pain, unspecified: Secondary | ICD-10-CM | POA: Insufficient documentation

## 2018-12-18 MED ORDER — TRAMADOL HCL 50 MG PO TABS: 50.0000 mg | ORAL_TABLET | Freq: Three times a day (TID) | ORAL | 0 refills | Status: DC | PRN

## 2018-12-18 MED ORDER — METHOCARBAMOL 500 MG PO TABS: 500.0000 mg | ORAL_TABLET | Freq: Three times a day (TID) | ORAL | 0 refills | Status: DC | PRN

## 2018-12-18 MED FILL — traMADol HCL 50 MG TABS: 50 | 7 days supply | Qty: 21 | Fill #0

## 2018-12-18 NOTE — Telephone Encounter (Signed)
I called patient 

## 2018-12-18 NOTE — Telephone Encounter (Signed)
Patient called to let you know [facility] is temporary closed. All rx's are being sent to [facility]. Please resubmit Tramadol to [facility] as that can not be transferred.

## 2018-12-18 NOTE — Telephone Encounter (Signed)
Patient requesting something for pain and muscle relaxer, Muse. Patient's contact [phone].

## 2018-12-18 NOTE — Progress Notes (Signed)
Office Visit Note   Patient: John Evans           Date of Birth: 18-May-1941           MRN: [id number] Visit Date: 12/18/2018              Requested by: Susy Frizzle, MD [address] [place], [place] [address] PCP: Susy Frizzle, MD   Assessment & Plan: Visit Diagnoses:  1. Chronic right-sided low back pain, unspecified whether sciatica present     Plan: Pain seems to be localized in the area of the right iliac crest.  Also appears to be worse with motion.  No groin pain.  Films demonstrate significant degenerative change at L5-S1.  No acute changes.  Will wear back support continue with Aleve.  If no improvement in the next several weeks would suggest scan.  No evidence of neurologic deficit or radiculopathy  Follow-Up Instructions: Return if symptoms worsen or fail to improve.   Orders:  Orders Placed This Encounter  Procedures  . XR Lumbar Spine 2-3 Views  . XR Pelvis 1-2 Views   No orders of the defined types were placed in this encounter.     Procedures: No procedures performed   Clinical Data: No additional findings.   Subjective: Chief Complaint  Patient presents with  . Lower Back - Pain  Patient presents today with right sided lower back pain. He has been doing a lot of yard work. He said that it started about two weeks ago. It has worsened over time. He said that after prolonged standing his right leg feels weak and painful, and he has to sit. The pain that comes from standing too long will subside in about 46minutes of rest. He cannot bend over to do his socks or shoes without pain. He is taking Tylenol as needed.  Pain is relatively localized over the anterior iliac crest the right side.  Seems to be worse with motion.  Very minimal back pain.  No groin pain.  No referred pain or radicular discomfort into the right lower extremity.  Has had prior lumbar  surgery by Dr. Carloyn Manner any years ago. HPI  Review of Systems  Constitutional:  Negative for fatigue.  HENT: Negative for ear pain.   Eyes: Negative for pain.  Respiratory: Negative for shortness of breath.   Cardiovascular: Negative for leg swelling.  Gastrointestinal: Negative for constipation and diarrhea.  Endocrine: Negative for cold intolerance and heat intolerance.  Genitourinary: Negative for difficulty urinating.  Musculoskeletal: Negative for joint swelling.  Skin: Negative for rash.  Allergic/Immunologic: Negative for food allergies.  Neurological: Positive for weakness.  Hematological: Does not bruise/bleed easily.  Psychiatric/Behavioral: Positive for sleep disturbance.     Objective: Vital Signs: BP (!) 153/80   Pulse 67   Ht 5\' 11"  (1.803 m)   Wt 190 lb (86.2 kg)   BMI 26.50 kg/m   Physical Exam Constitutional:      Appearance: He is well-developed.  Eyes:     Pupils: Pupils are equal, round, and reactive to light.  Pulmonary:     Effort: Pulmonary effort is normal.  Skin:    General: Skin is warm and dry.  Neurological:     Mental Status: He is alert and oriented to person, place, and time.  Psychiatric:        Behavior: Behavior normal.     Ortho Exam awake alert and oriented x3.  Comfortable sitting.  Straight leg raise  negative.  Did not seem to have any difference in motion between the right and the left hip.  No calf pain or distal edema.  Very minimal percussible lumbar pain.  No pain over the right iliac crest except with motion.  No pain at the sacroiliac joint or over the greater trochanter  Specialty Comments:  No specialty comments available.  Imaging: Xr Lumbar Spine 2-3 Views  Result Date: 12/18/2018 Films of the lumbar spine reveal significant decrease in the disc space and S1.  No listhesis.  Slight anterior listhesis of L4 and L5.  Some calcification of the anterior longitudinal ligament. no curvature  Xr Pelvis 1-2 Views  Result Date: 12/18/2018 AP the pelvis demonstrate some mild degenerative changes in the  right hip with small subchondral cyst.  The joint space is well-maintained.  No acute changes    PMFS History: Patient Active Problem List   Diagnosis Date Noted  . Acute right-sided low back pain without sciatica 12/18/2018  . BPH (benign prostatic hyperplasia)   . Hypertension   . OBSTRUCTIVE SLEEP APNEA 07/29/2009  . HYPERLIPIDEMIA TYPE I / IV 04/14/2009  . DYSPNEA 04/14/2009  . TOE PAIN 09/03/2008  . INSOMNIA UNSPECIFIED 06/13/2008  . ACUTE BRONCHITIS 07/19/2007  . IRRITABLE BOWEL SYNDROME 07/19/2007  . HYPERTROPHY PROSTATE W/O UR OBST & OTH LUTS 07/05/2007  . HYPERLIPIDEMIA 10/12/2006  . HYPERTENSION, BENIGN SYSTEMIC 10/12/2006  . Hyperplasia of prostate 10/12/2006   Past Medical History:  Diagnosis Date  . BPH (benign prostatic hyperplasia)   . Gout   . Hyperlipidemia   . Hypertension   . Insomnia   . Obstructive sleep apnea   . Panic attacks     Family History  Problem Relation Age of Onset  . Cancer Father        throat  . Esophageal cancer Father   . Diabetes Paternal Grandmother   . Colon cancer Neg Hx     Past Surgical History:  Procedure Laterality Date  . BACK SURGERY    . EYE SURGERY     Social History   Occupational History  . Not on file  Tobacco Use  . Smoking status: Never Smoker  . Smokeless tobacco: Never Used  Substance and Sexual Activity  . Alcohol use: No  . Drug use: No  . Sexual activity: Yes    Comment: married, retired Software engineer

## 2018-12-18 NOTE — Telephone Encounter (Signed)
Done

## 2018-12-19 MED FILL — METHOCARBAMOL 500 MG TABLET: 500 | 10 days supply | Qty: 30 | Fill #0

## 2018-12-21 MED FILL — DOXYCYCLINE HYCLATE 100 MG: 100 | 30 days supply | Qty: 60 | Fill #0

## 2019-01-16 MED FILL — LISINOPRIL 10 MG TABLET: 10 | 90 days supply | Qty: 90 | Fill #0

## 2019-02-21 IMAGING — DX DG CHEST 2V
2 series · 2 of 2 positions shown · non-contrast
Comparison: 11/07/2014.

CLINICAL DATA: Chest pain and discomfort.

EXAM:
CHEST - 2 VIEW

[dg chest 2 view (1 of 2)]
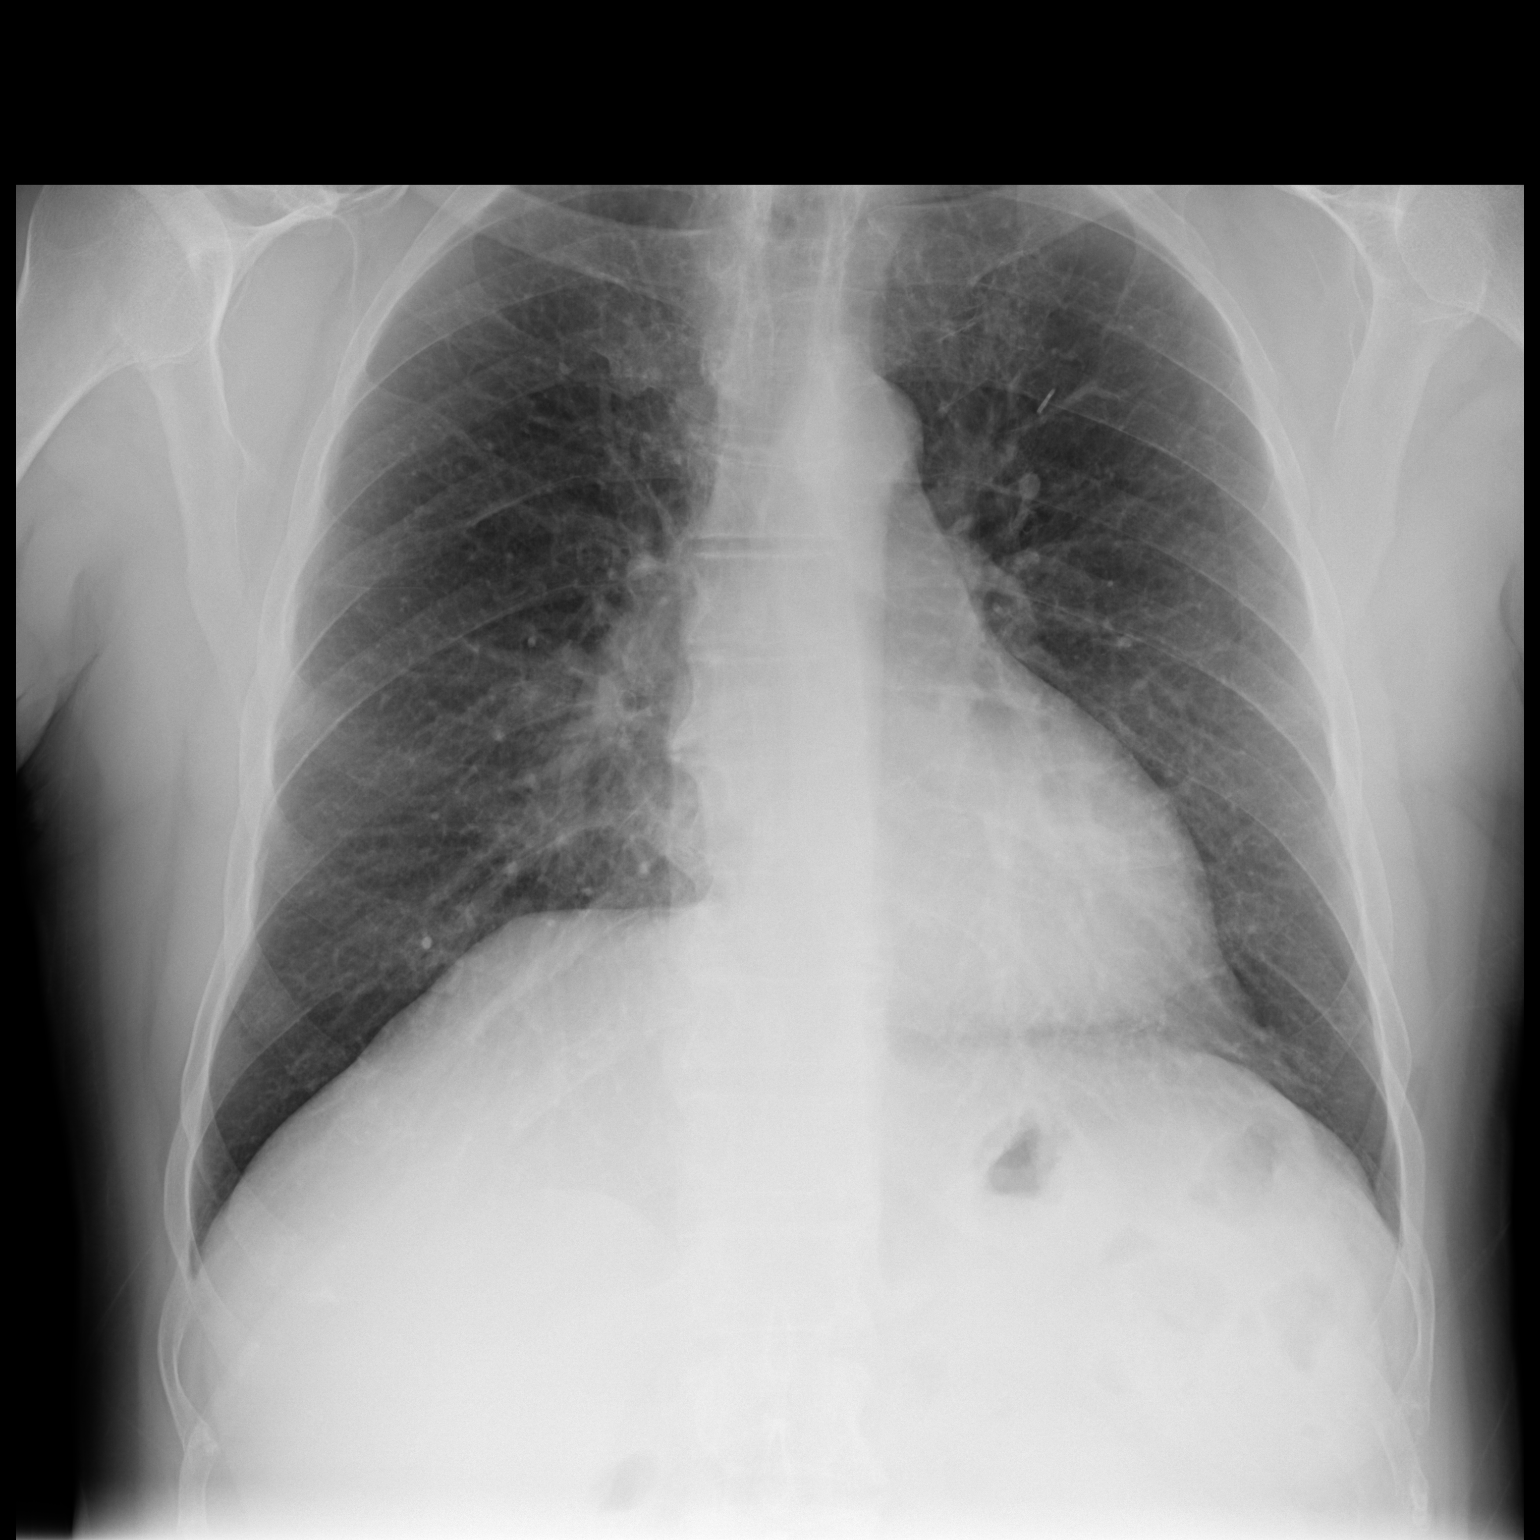

[dg chest 2 view (2 of 2)]
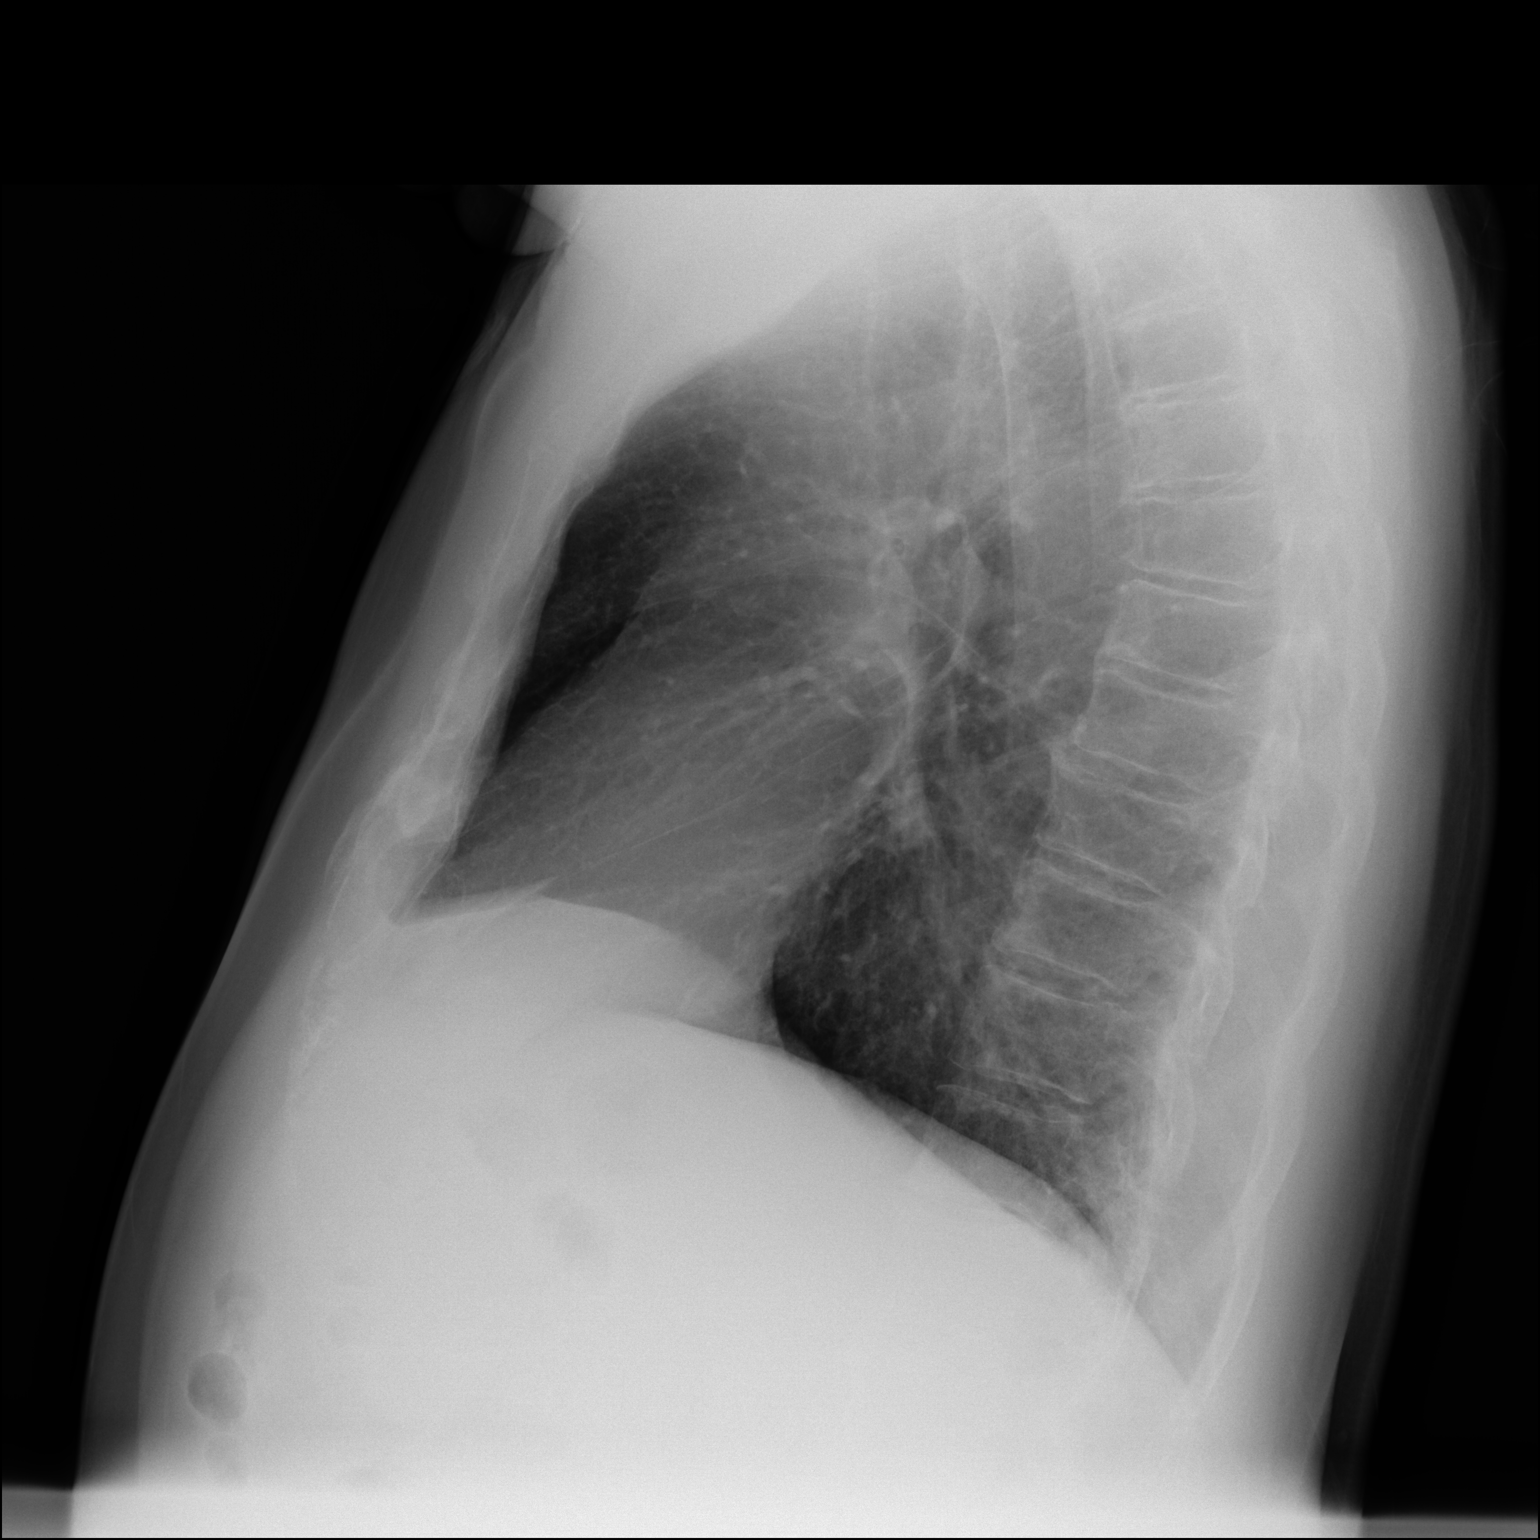

[2 of 2 positions shown; findings below may reference images not displayed]

FINDINGS: Mediastinum hilar structures normal. Heart size normal. No focal
infiltrate. No pleural effusion or pneumothorax. Surgical clips left
upper chest. No acute bony abnormality.
IMPRESSION: No acute cardiopulmonary disease.

## 2019-04-05 ENCOUNTER — Other Ambulatory Visit: Payer: Self-pay

## 2019-04-05 ENCOUNTER — Encounter: Payer: Self-pay | Admitting: Family Medicine

## 2019-04-05 ENCOUNTER — Ambulatory Visit (INDEPENDENT_AMBULATORY_CARE_PROVIDER_SITE_OTHER): Payer: Medicare Other | Admitting: Family Medicine

## 2019-04-05 VITALS — BP 118/64 | HR 86 | Temp 98.5°F | Resp 18 | Ht 71.0 in | Wt 189.0 lb

## 2019-04-05 DIAGNOSIS — R300 Dysuria: Secondary | ICD-10-CM

## 2019-04-05 DIAGNOSIS — N3001 Acute cystitis with hematuria: Secondary | ICD-10-CM

## 2019-04-05 LAB — URINALYSIS, ROUTINE W REFLEX MICROSCOPIC
Bilirubin Urine: NEGATIVE
Glucose, UA: NEGATIVE
Hyaline Cast: NONE SEEN /LPF
Ketones, ur: NEGATIVE
Nitrite: NEGATIVE
Protein, ur: NEGATIVE
Specific Gravity, Urine: 1.01 (ref 1.001–1.03)
WBC, UA: >=60 /HPF — AB (ref 0–5)
pH: 5.5 (ref 5.0–8.0)

## 2019-04-05 LAB — MICROSCOPIC MESSAGE

## 2019-04-05 MED ORDER — CIPROFLOXACIN HCL 500 MG PO TABS: 500.0000 mg | ORAL_TABLET | Freq: Two times a day (BID) | ORAL | 0 refills | Status: DC

## 2019-04-05 MED ORDER — CIPROFLOXACIN HCL 500 MG PO TABS: 500.0000 mg | ORAL_TABLET | Freq: Two times a day (BID) | ORAL | 0 refills | Status: AC

## 2019-04-05 NOTE — Progress Notes (Signed)
Subjective:    Patient ID: John Evans, male    DOB: 09-17-1940, 78 y.o.   MRN: [id number]  HPI  3 days of dysuria, urgency, frequency and feeling poorly.  Denies fever or back pain.  Urinalysis shows blood and leukocyte esterase. Past Medical History:  Diagnosis Date  . BPH (benign prostatic hyperplasia)   . Gout   . Hyperlipidemia   . Hypertension   . Insomnia   . Obstructive sleep apnea   . Panic attacks    Past Surgical History:  Procedure Laterality Date  . BACK SURGERY    . EYE SURGERY     Current Outpatient Medications on File Prior to Visit  Medication Sig Dispense Refill  . aspirin 81 MG tablet Take 81 mg by mouth daily.      . Cholecalciferol (VITAMIN D3) 50 MCG (2000 UT) TABS Take by mouth.    . Cyanocobalamin (VITAMIN B 12 PO) Take by mouth.    . finasteride (PROSCAR) 5 MG tablet Take 5 mg daily by mouth.     Marland Kitchen lisinopril (PRINIVIL,ZESTRIL) 10 MG tablet TAKE ONE (1) TABLET BY MOUTH EVERY DAY 90 tablet 3  . methocarbamol (ROBAXIN) 500 MG tablet Take 1 tablet (500 mg total) by mouth every 8 (eight) hours as needed for muscle spasms. 30 tablet 0  . traMADol (ULTRAM) 50 MG tablet Take 1 tablet (50 mg total) by mouth every 8 (eight) hours as needed. 30 tablet 0   Current Facility-Administered Medications on File Prior to Visit  Medication Dose Route Frequency Provider Last Rate Last Dose  . 0.9 %  sodium chloride infusion  500 mL Intravenous Continuous Doran Stabler, MD       Allergies  Allergen Reactions  . Latex Rash   Social History   Socioeconomic History  . Marital status: Married    Spouse name: Not on file  . Number of children: Not on file  . Years of education: Not on file  . Highest education level: Not on file  Occupational History  . Not on file  Social Needs  . Financial resource strain: Not on file  . Food insecurity    Worry: Not on file    Inability: Not on file  . Transportation needs    Medical: Not on file    Non-medical:  Not on file  Tobacco Use  . Smoking status: Never Smoker  . Smokeless tobacco: Never Used  Substance and Sexual Activity  . Alcohol use: No  . Drug use: No  . Sexual activity: Yes    Comment: married, retired Software engineer  Lifestyle  . Physical activity    Days per week: Not on file    Minutes per session: Not on file  . Stress: Not on file  Relationships  . Social Herbalist on phone: Not on file    Gets together: Not on file    Attends religious service: Not on file    Active member of club or organization: Not on file    Attends meetings of clubs or organizations: Not on file    Relationship status: Not on file  . Intimate partner violence    Fear of current or ex partner: Not on file    Emotionally abused: Not on file    Physically abused: Not on file    Forced sexual activity: Not on file  Other Topics Concern  . Not on file  Social History Narrative  . Not on file  Review of Systems     Objective:   Physical Exam Constitutional:      General: He is not in acute distress.    Appearance: Normal appearance. He is not ill-appearing or toxic-appearing.  Cardiovascular:     Rate and Rhythm: Normal rate.     Heart sounds: Normal heart sounds. No murmur.  Pulmonary:     Effort: Pulmonary effort is normal.     Breath sounds: Normal breath sounds.  Abdominal:     General: Bowel sounds are normal.     Palpations: Abdomen is soft.     Tenderness: There is no abdominal tenderness.  Neurological:     Mental Status: He is alert.           Assessment & Plan:  The primary encounter diagnosis was Burning with urination. A diagnosis of Acute cystitis with hematuria was also pertinent to this visit. Cipro 500 mg pobid for 10 days

## 2019-04-10 ENCOUNTER — Telehealth: Payer: Self-pay | Admitting: Family Medicine

## 2019-04-10 DIAGNOSIS — R300 Dysuria: Secondary | ICD-10-CM | POA: Diagnosis not present

## 2019-04-10 NOTE — Telephone Encounter (Signed)
Patient is calling to say that he is still in a lot of pain from uti  Please give him a call at [phone]

## 2019-04-10 NOTE — Telephone Encounter (Signed)
Spoke to pt and he states that he is in terrible pain and Tramadol is not touching it. He has also tried AZO and it is not helping either. He did also contact his urologist and went by there and left a specimen. He would like something stronger for his pain if possible.

## 2019-04-11 ENCOUNTER — Other Ambulatory Visit: Payer: Self-pay | Admitting: Family Medicine

## 2019-04-11 MED ORDER — HYDROCODONE-ACETAMINOPHEN 5-325 MG PO TABS: 1.0000 | ORAL_TABLET | Freq: Four times a day (QID) | ORAL | 0 refills | Status: DC | PRN

## 2019-04-11 NOTE — Telephone Encounter (Signed)
I sent in norco.  Needs recheck if no better

## 2019-04-11 NOTE — Telephone Encounter (Signed)
Pt aware.

## 2019-06-20 DIAGNOSIS — Z961 Presence of intraocular lens: Secondary | ICD-10-CM | POA: Diagnosis not present

## 2019-06-20 DIAGNOSIS — H2511 Age-related nuclear cataract, right eye: Secondary | ICD-10-CM | POA: Diagnosis not present

## 2019-06-20 DIAGNOSIS — H4720 Unspecified optic atrophy: Secondary | ICD-10-CM | POA: Diagnosis not present

## 2019-06-20 DIAGNOSIS — H35372 Puckering of macula, left eye: Secondary | ICD-10-CM | POA: Diagnosis not present

## 2019-06-20 DIAGNOSIS — H40003 Preglaucoma, unspecified, bilateral: Secondary | ICD-10-CM | POA: Insufficient documentation

## 2019-07-03 ENCOUNTER — Telehealth: Payer: Self-pay | Admitting: Family Medicine

## 2019-07-03 NOTE — Telephone Encounter (Signed)
Pt called and stated that he pulled a tick off of him this morning and he does not know how long it has been there but wanted to know what he should do about it? Pt denies any bullseye on skin, unusual fatigue or fever. He wanted to know if he needed antibx. Informed pt he could use otc hydrocortisone cream for itching/irritation and some antibx ointment if needed. To let us know if any of the above symptoms occur then he would need to be seen. He states that it is a little red and swollen but none of the above symptoms yet. Pt verbalized understanding.

## 2019-08-07 DIAGNOSIS — Z23 Encounter for immunization: Secondary | ICD-10-CM | POA: Diagnosis not present

## 2019-08-23 ENCOUNTER — Other Ambulatory Visit (HOSPITAL_COMMUNITY): Payer: Self-pay | Admitting: Urology

## 2019-09-04 ENCOUNTER — Other Ambulatory Visit: Payer: Self-pay | Admitting: Family Medicine

## 2019-09-25 DIAGNOSIS — H35352 Cystoid macular degeneration, left eye: Secondary | ICD-10-CM | POA: Diagnosis not present

## 2019-09-25 DIAGNOSIS — H401131 Primary open-angle glaucoma, bilateral, mild stage: Secondary | ICD-10-CM | POA: Diagnosis not present

## 2019-09-25 DIAGNOSIS — H2511 Age-related nuclear cataract, right eye: Secondary | ICD-10-CM | POA: Diagnosis not present

## 2019-09-25 DIAGNOSIS — Z961 Presence of intraocular lens: Secondary | ICD-10-CM | POA: Diagnosis not present

## 2019-10-21 DIAGNOSIS — H527 Unspecified disorder of refraction: Secondary | ICD-10-CM | POA: Diagnosis not present

## 2019-10-23 ENCOUNTER — Encounter: Payer: Self-pay | Admitting: Family Medicine

## 2019-10-23 DIAGNOSIS — D2261 Melanocytic nevi of right upper limb, including shoulder: Secondary | ICD-10-CM | POA: Diagnosis not present

## 2019-10-23 DIAGNOSIS — L570 Actinic keratosis: Secondary | ICD-10-CM | POA: Diagnosis not present

## 2019-10-23 DIAGNOSIS — L821 Other seborrheic keratosis: Secondary | ICD-10-CM | POA: Diagnosis not present

## 2019-10-23 DIAGNOSIS — D1801 Hemangioma of skin and subcutaneous tissue: Secondary | ICD-10-CM | POA: Diagnosis not present

## 2019-10-23 DIAGNOSIS — D225 Melanocytic nevi of trunk: Secondary | ICD-10-CM | POA: Diagnosis not present

## 2019-10-23 DIAGNOSIS — L812 Freckles: Secondary | ICD-10-CM | POA: Diagnosis not present

## 2019-10-23 DIAGNOSIS — D485 Neoplasm of uncertain behavior of skin: Secondary | ICD-10-CM | POA: Diagnosis not present

## 2019-11-05 ENCOUNTER — Encounter: Payer: Self-pay | Admitting: Family Medicine

## 2019-11-05 ENCOUNTER — Ambulatory Visit (INDEPENDENT_AMBULATORY_CARE_PROVIDER_SITE_OTHER): Payer: Medicare Other | Admitting: Family Medicine

## 2019-11-05 ENCOUNTER — Other Ambulatory Visit: Payer: Self-pay

## 2019-11-05 VITALS — BP 118/74 | HR 72 | Temp 97.7°F | Resp 18 | Ht 71.0 in | Wt 192.0 lb

## 2019-11-05 DIAGNOSIS — M1000 Idiopathic gout, unspecified site: Secondary | ICD-10-CM | POA: Diagnosis not present

## 2019-11-05 MED ORDER — ALLOPURINOL 300 MG PO TABS: 300.0000 mg | ORAL_TABLET | Freq: Every day | ORAL | 6 refills | Status: DC

## 2019-11-05 MED ORDER — PREDNISONE 20 MG PO TABS: ORAL_TABLET | ORAL | 0 refills | Status: DC

## 2019-11-05 MED ORDER — COLCHICINE 0.6 MG PO TABS: 0.6000 mg | ORAL_TABLET | Freq: Every day | ORAL | 3 refills | Status: DC

## 2019-11-05 NOTE — Progress Notes (Signed)
Subjective:    Patient ID: John Evans, male    DOB: 05-20-1941, 79 y.o.   MRN: [id number]  HPI  Patient reports severe pain in his right first MTP joint typical for his gout flares.  He has tried colchicine with no relief.  The joint is red hot and swollen.  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  He is now getting 3-4 gout attacks per year. Past Medical History:  Diagnosis Date  . BPH (benign prostatic hyperplasia)   . Gout   . Hyperlipidemia   . Hypertension   . Insomnia   . Obstructive sleep apnea   . Panic attacks    Past Surgical History:  Procedure Laterality Date  . BACK SURGERY    . EYE SURGERY     Current Outpatient Medications on File Prior to Visit  Medication Sig Dispense Refill  . aspirin 81 MG tablet Take 81 mg by mouth daily. Every other day    . Cholecalciferol (VITAMIN D3) 50 MCG (2000 UT) TABS Take by mouth.    . Cyanocobalamin (VITAMIN B 12 PO) Take by mouth.    . finasteride (PROSCAR) 5 MG tablet Take 5 mg daily by mouth.     Marland Kitchen lisinopril (ZESTRIL) 10 MG tablet TAKE ONE (1) TABLET BY MOUTH EVERY DAY 90 tablet 1   Current Facility-Administered Medications on File Prior to Visit  Medication Dose Route Frequency Provider Last Rate Last Admin  . 0.9 %  sodium chloride infusion  500 mL Intravenous Continuous Doran Stabler, MD       Allergies  Allergen Reactions  . Latex Rash   Social History   Socioeconomic History  . Marital status: Married    Spouse name: Not on file  . Number of children: Not on file  . Years of education: Not on file  . Highest education level: Not on file  Occupational History  . Not on file  Tobacco Use  . Smoking status: Never Smoker  . Smokeless tobacco: Never Used  Substance and Sexual Activity  . Alcohol use: No  . Drug use: No  . Sexual activity: Yes    Comment: married, retired Software engineer  Other Topics Concern  . Not on file  Social History Narrative  . Not on file    Social Determinants of Health   Financial Resource Strain:   . Difficulty of Paying Living Expenses:   Food Insecurity:   . Worried About Charity fundraiser in the Last Year:   . Arboriculturist in the Last Year:   Transportation Needs:   . Film/video editor (Medical):   Marland Kitchen Lack of Transportation (Non-Medical):   Physical Activity:   . Days of Exercise per Week:   . Minutes of Exercise per Session:   Stress:   . Feeling of Stress :   Social Connections:   . Frequency of Communication with Friends and Family:   . Frequency of Social Gatherings with Friends and Family:   . Attends Religious Services:   . Active Member of Clubs or Organizations:   . Attends Archivist Meetings:   Marland Kitchen Marital Status:   Intimate Partner Violence:   . Fear of Current or Ex-Partner:   . Emotionally Abused:   Marland Kitchen Physically Abused:   . Sexually Abused:        Review of Systems     Objective:   Physical Exam Constitutional:  General: He is not in acute distress.    Appearance: Normal appearance. He is not ill-appearing or toxic-appearing.  Cardiovascular:     Rate and Rhythm: Normal rate.     Heart sounds: Normal heart sounds. No murmur.  Pulmonary:     Effort: Pulmonary effort is normal.     Breath sounds: Normal breath sounds.  Abdominal:     General: Bowel sounds are normal.     Palpations: Abdomen is soft.     Tenderness: There is no abdominal tenderness.  Neurological:     Mental Status: He is alert.           Assessment & Plan:  The encounter diagnosis was Acute idiopathic gout, unspecified site. Recommended a prednisone taper pack for the gout attack.  Once the gout attack has resolved recommended starting allopurinol 300 mg a day plus colchicine 0.6 mg a day to help lower his uric acid level less than 6 to prevent future gout attacks.  Recheck a uric acid level in 3 to 4 months.  Once uric acid level is less than 6, discontinue colchicine.

## 2019-11-06 LAB — URIC ACID: Uric Acid, Serum: 7.6 mg/dL (ref 4.0–8.0)

## 2019-11-06 LAB — BASIC METABOLIC PANEL WITH GFR
BUN: 16 mg/dL (ref 7–25)
CO2: 27 mmol/L (ref 20–32)
Calcium: 9.5 mg/dL (ref 8.6–10.3)
Chloride: 102 mmol/L (ref 98–110)
Creat: 0.99 mg/dL (ref 0.70–1.18)
GFR, Est African American: 84 mL/min/{1.73_m2} (ref >=60)
GFR, Est Non African American: 72 mL/min/{1.73_m2} (ref >=60)
Glucose, Bld: 99 mg/dL (ref 65–99)
Potassium: 5 mmol/L (ref 3.5–5.3)
Sodium: 139 mmol/L (ref 135–146)

## 2019-11-11 ENCOUNTER — Other Ambulatory Visit: Payer: Self-pay | Admitting: Family Medicine

## 2019-11-11 DIAGNOSIS — E790 Hyperuricemia without signs of inflammatory arthritis and tophaceous disease: Secondary | ICD-10-CM

## 2019-11-21 ENCOUNTER — Encounter: Payer: Self-pay | Admitting: Family Medicine

## 2019-11-21 DIAGNOSIS — D485 Neoplasm of uncertain behavior of skin: Secondary | ICD-10-CM | POA: Diagnosis not present

## 2019-11-21 DIAGNOSIS — L988 Other specified disorders of the skin and subcutaneous tissue: Secondary | ICD-10-CM | POA: Diagnosis not present

## 2020-01-03 ENCOUNTER — Other Ambulatory Visit: Payer: Medicare Other

## 2020-01-03 ENCOUNTER — Other Ambulatory Visit: Payer: Self-pay

## 2020-01-03 DIAGNOSIS — E790 Hyperuricemia without signs of inflammatory arthritis and tophaceous disease: Secondary | ICD-10-CM

## 2020-01-04 LAB — URIC ACID: Uric Acid, Serum: 5.2 mg/dL (ref 4.0–8.0)

## 2020-01-29 ENCOUNTER — Other Ambulatory Visit: Payer: Self-pay | Admitting: Podiatry

## 2020-01-29 ENCOUNTER — Encounter: Payer: Self-pay | Admitting: Podiatry

## 2020-01-29 ENCOUNTER — Ambulatory Visit: Payer: Medicare Other

## 2020-01-29 ENCOUNTER — Other Ambulatory Visit: Payer: Self-pay

## 2020-01-29 ENCOUNTER — Ambulatory Visit: Payer: Medicare Other | Admitting: Podiatry

## 2020-01-29 ENCOUNTER — Ambulatory Visit (INDEPENDENT_AMBULATORY_CARE_PROVIDER_SITE_OTHER): Payer: Medicare Other

## 2020-01-29 VITALS — Temp 97.3°F

## 2020-01-29 DIAGNOSIS — M7662 Achilles tendinitis, left leg: Secondary | ICD-10-CM

## 2020-01-29 DIAGNOSIS — M79671 Pain in right foot: Secondary | ICD-10-CM

## 2020-01-29 DIAGNOSIS — M25572 Pain in left ankle and joints of left foot: Secondary | ICD-10-CM | POA: Diagnosis not present

## 2020-01-29 DIAGNOSIS — M79672 Pain in left foot: Secondary | ICD-10-CM

## 2020-01-29 DIAGNOSIS — M7661 Achilles tendinitis, right leg: Secondary | ICD-10-CM

## 2020-01-29 NOTE — Patient Instructions (Signed)

## 2020-01-29 NOTE — Progress Notes (Signed)
   Subjective:    Patient ID: John Evans, male    DOB: 1940/12/11, 79 y.o.   MRN: [id number]  HPI    Review of Systems  All other systems reviewed and are negative.      Objective:   Physical Exam        Assessment & Plan:

## 2020-01-29 NOTE — Progress Notes (Signed)
Subjective:   Patient ID: John Evans, male   DOB: 79 y.o.   MRN: [id number]   HPI Patient presents stating that the left back of the heel has been sore and it is a constant pain and states is been going on for several months.  Also has significant tingling which occurs at nighttime and he is tried gabapentin without relief of symptoms and states that the back of the heel is keeping him from being active.  Patient does not smoke likes to be active   Review of Systems  All other systems reviewed and are negative.       Objective:  Physical Exam Vitals and nursing note reviewed.  Constitutional:      Appearance: He is well-developed.  Pulmonary:     Effort: Pulmonary effort is normal.  Musculoskeletal:        General: Normal range of motion.  Skin:    General: Skin is warm.  Neurological:     Mental Status: He is alert.     Neurovascular status intact muscle strength adequate range of motion was within normal limits.  I did not note diminishment of sharp dull vibratory at this time and I did note there is exquisite discomfort posterior aspect heel left lateral side at the insertion of the Achilles into the calcaneus with no center medial involvement.  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  I then went ahead discussed neuropathy and I do think he should try to get a bed I could lift him up and maybe that would help to be able to sleep better and I also  suggested possibility for Cymbalta.  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

## 2020-02-20 ENCOUNTER — Encounter: Payer: Self-pay | Admitting: Podiatry

## 2020-02-20 ENCOUNTER — Other Ambulatory Visit: Payer: Self-pay | Admitting: Family Medicine

## 2020-02-20 ENCOUNTER — Ambulatory Visit: Payer: Medicare Other | Admitting: Podiatry

## 2020-02-20 ENCOUNTER — Other Ambulatory Visit: Payer: Self-pay

## 2020-02-20 ENCOUNTER — Other Ambulatory Visit (HOSPITAL_COMMUNITY): Payer: Self-pay | Admitting: Nurse Practitioner

## 2020-02-20 VITALS — Temp 97.1°F

## 2020-02-20 DIAGNOSIS — M7662 Achilles tendinitis, left leg: Secondary | ICD-10-CM

## 2020-02-24 NOTE — Progress Notes (Signed)
Subjective:   Patient ID: John Evans, male   DOB: 79 y.o.   MRN: [id number]   HPI Patient states I am still getting pain but is not as severe as it was and I been wearing my boot but not all the time   ROS      Objective:  Physical Exam  Neurovascular status intact with discomfort of the left ankle posterior heel that is improved but still present     Assessment:  Tendinitis with ankle instability left that is improving with immobilization but not yet 100%     Plan:  H&P reviewed condition and at this point continue boot usage continue home physical therapy strengthening exercises and if symptoms worsen we will consider other treatment alternatives

## 2020-04-17 DIAGNOSIS — R311 Benign essential microscopic hematuria: Secondary | ICD-10-CM | POA: Diagnosis not present

## 2020-04-17 DIAGNOSIS — R3915 Urgency of urination: Secondary | ICD-10-CM | POA: Diagnosis not present

## 2020-06-09 ENCOUNTER — Other Ambulatory Visit: Payer: Self-pay | Admitting: Family Medicine

## 2020-06-09 ENCOUNTER — Other Ambulatory Visit (HOSPITAL_COMMUNITY): Payer: Self-pay | Admitting: Family Medicine

## 2020-06-23 ENCOUNTER — Other Ambulatory Visit (HOSPITAL_COMMUNITY): Payer: Self-pay | Admitting: Family Medicine

## 2020-07-23 ENCOUNTER — Ambulatory Visit: Payer: Medicare Other | Attending: Internal Medicine

## 2020-07-23 DIAGNOSIS — Z23 Encounter for immunization: Secondary | ICD-10-CM

## 2020-07-23 NOTE — Progress Notes (Signed)
   Covid-[facility] Vaccination [facility]  Name:  John Evans    MRN: [id number] DOB: 03/03/1941  07/23/2020  [name] was observed post Covid-19 immunization for 15 minutes without incident. He was provided with Vaccine Information Sheet and instruction to access the V-Safe system.   Mr. Gram was instructed to call 911 with any severe reactions post vaccine: Marland Kitchen Difficulty breathing  . Swelling of face and throat  . A fast heartbeat  . A bad rash all over body  . Dizziness and weakness   Immunizations Administered    No immunizations on file.

## 2020-08-19 ENCOUNTER — Other Ambulatory Visit (HOSPITAL_COMMUNITY): Payer: Self-pay | Admitting: Family Medicine

## 2020-08-19 ENCOUNTER — Other Ambulatory Visit: Payer: Self-pay | Admitting: Family Medicine

## 2020-09-08 ENCOUNTER — Ambulatory Visit (INDEPENDENT_AMBULATORY_CARE_PROVIDER_SITE_OTHER): Payer: Medicare Other | Admitting: Family Medicine

## 2020-09-08 ENCOUNTER — Other Ambulatory Visit: Payer: Self-pay

## 2020-09-08 ENCOUNTER — Encounter: Payer: Self-pay | Admitting: Family Medicine

## 2020-09-08 VITALS — BP 138/82 | HR 90 | Temp 98.1°F | Resp 14 | Ht 71.0 in | Wt 196.0 lb

## 2020-09-08 DIAGNOSIS — E7800 Pure hypercholesterolemia, unspecified: Secondary | ICD-10-CM

## 2020-09-08 DIAGNOSIS — M7552 Bursitis of left shoulder: Secondary | ICD-10-CM | POA: Diagnosis not present

## 2020-09-08 NOTE — Progress Notes (Signed)
Subjective:    Patient ID: John Evans, male    DOB: October 10, 1940, 80 y.o.   MRN: [id number]  HPI  Patient is a very pleasant 80 year old Caucasian male who presents today complaining of 3 months of pain in his left shoulder.  It hurts to abduct his shoulder greater than 90 degrees.  He has pain with empty can testing.  He has pain with Hawkins sign.  He also has significant decreased in his range of motion with internal rotation.  Subscapularis liftoff test elicits significant pain.  There is no crepitus on range of motion.  Pain began after he got a flu shot in October.  He is also requesting a referral for a coronary artery calcium score.  He denies any angina or chest pain Past Medical History:  Diagnosis Date  . BPH (benign prostatic hyperplasia)   . Gout   . Hyperlipidemia   . Hypertension   . Insomnia   . Obstructive sleep apnea   . Panic attacks    Past Surgical History:  Procedure Laterality Date  . BACK SURGERY    . EYE SURGERY     Current Outpatient Medications on File Prior to Visit  Medication Sig Dispense Refill  . allopurinol (ZYLOPRIM) 300 MG tablet TAKE 1 TABLET (300 MG TOTAL) BY MOUTH DAILY. 30 tablet 6  . aspirin 81 MG tablet Take 81 mg by mouth daily. Every other day    . Cholecalciferol (VITAMIN D3) 50 MCG (2000 UT) TABS Take by mouth.    . Cyanocobalamin (VITAMIN B 12 PO) Take by mouth.    . finasteride (PROSCAR) 5 MG tablet Take 5 mg daily by mouth.     Marland Kitchen lisinopril (ZESTRIL) 10 MG tablet TAKE ONE (1) TABLET BY MOUTH EVERY DAY 90 tablet 1   Current Facility-Administered Medications on File Prior to Visit  Medication Dose Route Frequency Provider Last Rate Last Admin  . 0.9 %  sodium chloride infusion  500 mL Intravenous Continuous Doran Stabler, MD       Allergies  Allergen Reactions  . Latex Rash   Social History   Socioeconomic History  . Marital status: Married    Spouse name: Not on file  . Number of children: Not on file  . Years of  education: Not on file  . Highest education level: Not on file  Occupational History  . Not on file  Tobacco Use  . Smoking status: Never Smoker  . Smokeless tobacco: Never Used  Substance and Sexual Activity  . Alcohol use: No  . Drug use: No  . Sexual activity: Yes    Comment: married, retired Software engineer  Other Topics Concern  . Not on file  Social History Narrative  . Not on file   Social Determinants of Health   Financial Resource Strain: Not on file  Food Insecurity: Not on file  Transportation Needs: Not on file  Physical Activity: Not on file  Stress: Not on file  Social Connections: Not on file  Intimate Partner Violence: Not on file       Review of Systems     Objective:   Physical Exam Constitutional:      General: He is not in acute distress.    Appearance: Normal appearance. He is not ill-appearing or toxic-appearing.  Cardiovascular:     Rate and Rhythm: Normal rate.     Heart sounds: Normal heart sounds. No murmur heard.   Pulmonary:     Effort: Pulmonary effort is  normal.     Breath sounds: Normal breath sounds.  Abdominal:     General: Bowel sounds are normal.     Palpations: Abdomen is soft.     Tenderness: There is no abdominal tenderness.  Musculoskeletal:     Left shoulder: Tenderness and bony tenderness present. No crepitus. Decreased range of motion. Decreased strength.  Neurological:     Mental Status: He is alert.           Assessment & Plan:  Pure hypercholesterolemia  Subacromial bursitis of left shoulder joint  I would be glad to send the patient to see cardiology to discuss coronary artery calcium score testing.  He denies any chest pain or angina.  I believe he likely has subacromial bursitis in his left shoulder.  Using sterile technique, I injected the left subacromial space with 2 cc of lidocaine, 2 cc of Marcaine, and 2 cc of 40 mg/mL Kenalog.  The patient tolerated the procedure well without complication

## 2020-09-09 ENCOUNTER — Other Ambulatory Visit (HOSPITAL_COMMUNITY): Payer: Self-pay | Admitting: Urology

## 2020-09-27 ENCOUNTER — Encounter: Payer: Self-pay | Admitting: Family Medicine

## 2020-09-27 DIAGNOSIS — I10 Essential (primary) hypertension: Secondary | ICD-10-CM

## 2020-09-27 DIAGNOSIS — E7800 Pure hypercholesterolemia, unspecified: Secondary | ICD-10-CM

## 2020-09-27 DIAGNOSIS — E785 Hyperlipidemia, unspecified: Secondary | ICD-10-CM

## 2020-09-30 DIAGNOSIS — H2511 Age-related nuclear cataract, right eye: Secondary | ICD-10-CM | POA: Diagnosis not present

## 2020-09-30 DIAGNOSIS — H35352 Cystoid macular degeneration, left eye: Secondary | ICD-10-CM | POA: Diagnosis not present

## 2020-09-30 DIAGNOSIS — H401131 Primary open-angle glaucoma, bilateral, mild stage: Secondary | ICD-10-CM | POA: Diagnosis not present

## 2020-09-30 DIAGNOSIS — Z961 Presence of intraocular lens: Secondary | ICD-10-CM | POA: Diagnosis not present

## 2020-10-07 ENCOUNTER — Encounter: Payer: Self-pay | Admitting: Family Medicine

## 2020-10-12 ENCOUNTER — Encounter: Payer: Self-pay | Admitting: Family Medicine

## 2020-10-15 ENCOUNTER — Encounter: Payer: Self-pay | Admitting: Family Medicine

## 2020-10-20 ENCOUNTER — Telehealth: Payer: Self-pay | Admitting: Family Medicine

## 2020-10-20 NOTE — Telephone Encounter (Signed)
Pt called wanting to know the results of his radiology report. Please call back with any info   Cb#: [phone]

## 2020-10-21 NOTE — Telephone Encounter (Signed)
Calling back for results have not heard back yet.   Cb# [phone]

## 2020-10-21 NOTE — Telephone Encounter (Signed)
Have we received a fax for this pt from [facility] radiology?

## 2020-10-22 ENCOUNTER — Telehealth: Payer: Self-pay

## 2020-10-22 DIAGNOSIS — H2511 Age-related nuclear cataract, right eye: Secondary | ICD-10-CM | POA: Diagnosis not present

## 2020-10-22 DIAGNOSIS — H4720 Unspecified optic atrophy: Secondary | ICD-10-CM | POA: Diagnosis not present

## 2020-10-22 DIAGNOSIS — H35372 Puckering of macula, left eye: Secondary | ICD-10-CM | POA: Diagnosis not present

## 2020-10-22 NOTE — Telephone Encounter (Signed)
Received fax from [facility] radiology for pt cardiac score, pt score is 22. He is at mild risk, Dr. Dennard Schaumann suggest that pt controls cholesterol. Results given to daughter, verbalized understanding. Results have been sent to scan

## 2020-10-22 NOTE — Telephone Encounter (Signed)
Received fax from [facility] radiology for pt cardiac score, pt score is 22. He is at mild risk, Dr. Dennard Schaumann suggest that pt controls cholesterol. Results given to daughter, verbalized understanding. Results have been sent to scan

## 2020-10-23 ENCOUNTER — Other Ambulatory Visit (HOSPITAL_COMMUNITY): Payer: Self-pay | Admitting: Dermatology

## 2020-10-27 DIAGNOSIS — D225 Melanocytic nevi of trunk: Secondary | ICD-10-CM | POA: Diagnosis not present

## 2020-10-27 DIAGNOSIS — D2261 Melanocytic nevi of right upper limb, including shoulder: Secondary | ICD-10-CM | POA: Diagnosis not present

## 2020-10-27 DIAGNOSIS — D1801 Hemangioma of skin and subcutaneous tissue: Secondary | ICD-10-CM | POA: Diagnosis not present

## 2020-10-27 DIAGNOSIS — L812 Freckles: Secondary | ICD-10-CM | POA: Diagnosis not present

## 2020-10-27 DIAGNOSIS — L570 Actinic keratosis: Secondary | ICD-10-CM | POA: Diagnosis not present

## 2020-10-27 DIAGNOSIS — D485 Neoplasm of uncertain behavior of skin: Secondary | ICD-10-CM | POA: Diagnosis not present

## 2020-10-27 DIAGNOSIS — L821 Other seborrheic keratosis: Secondary | ICD-10-CM | POA: Diagnosis not present

## 2020-10-27 DIAGNOSIS — C44329 Squamous cell carcinoma of skin of other parts of face: Secondary | ICD-10-CM | POA: Diagnosis not present

## 2020-11-06 ENCOUNTER — Ambulatory Visit (INDEPENDENT_AMBULATORY_CARE_PROVIDER_SITE_OTHER): Payer: Medicare Other | Admitting: Family Medicine

## 2020-11-06 ENCOUNTER — Other Ambulatory Visit: Payer: Self-pay

## 2020-11-06 ENCOUNTER — Encounter: Payer: Self-pay | Admitting: Family Medicine

## 2020-11-06 VITALS — BP 140/88 | HR 80 | Temp 98.7°F | Resp 16 | Ht 71.0 in | Wt 195.0 lb

## 2020-11-06 DIAGNOSIS — R42 Dizziness and giddiness: Secondary | ICD-10-CM | POA: Diagnosis not present

## 2020-11-06 DIAGNOSIS — R55 Syncope and collapse: Secondary | ICD-10-CM

## 2020-11-06 NOTE — Progress Notes (Signed)
Orthostatic BP:  HR:  SpO2:  Lying:   146/82  84  95  Sitting:  148/84  80  94  Standing:  140/88  84  96  Standing x2:  142/88  82  96

## 2020-11-06 NOTE — Addendum Note (Signed)
Addended by: Sheral Flow on: 11/06/2020 12:02 PM   Modules accepted: Orders

## 2020-11-06 NOTE — Progress Notes (Signed)
Subjective:    Patient ID: John Evans, male    DOB: Sep 09, 1940, 80 y.o.   MRN: [id number]  HPI  Patient presents today complaining of orthostatic dizziness.  He states that this is been going on for some time.  We had him discontinue his blood pressure medication however this is not helped.  He states that he feels fine first thing in the morning.  However around midday he will start to feel weak and tired.  He will go check his blood pressure and found it to be low.  However his blood pressures do not sound terribly low.  His systolic blood pressures typically around 120.  His diastolic blood pressures typically as low as 50-60.  I do not believe that these will be sufficient to cause severe fatigue or orthostatic dizziness.  However he states that he feels weak and tired and feels like he has to lie down.  If he sits up quickly he will feel like he is going to pass out.  If he stands up he will feel like he is going to pass out.  I was very specific with the patient regarding syncope versus vertigo.  He denies any classic symptoms of vertigo.  He denies that the room will span.  He is emphatic that he will feel like he is going to pass out.  He denies any other autonomic dysfunction.  He denies any trouble swallowing, trouble digesting his food, constipation, urinary retention.  He denies any melena or hematochezia or blood loss.  He denies any dehydration.  He states that he is drinking plenty of water. Past Medical History:  Diagnosis Date  . BPH (benign prostatic hyperplasia)   . Gout   . Hyperlipidemia   . Hypertension   . Insomnia   . Obstructive sleep apnea   . Panic attacks    Past Surgical History:  Procedure Laterality Date  . BACK SURGERY    . EYE SURGERY     Current Outpatient Medications on File Prior to Visit  Medication Sig Dispense Refill  . allopurinol (ZYLOPRIM) 300 MG tablet TAKE 1 TABLET (300 MG TOTAL) BY MOUTH DAILY. 30 tablet 6  . aspirin 81 MG tablet Take 81  mg by mouth daily. Every other day    . Cholecalciferol (VITAMIN D3) 50 MCG (2000 UT) TABS Take by mouth.    . Cyanocobalamin (VITAMIN B 12 PO) Take by mouth.    . finasteride (PROSCAR) 5 MG tablet Take 5 mg daily by mouth.      Current Facility-Administered Medications on File Prior to Visit  Medication Dose Route Frequency Provider Last Rate Last Admin  . 0.9 %  sodium chloride infusion  500 mL Intravenous Continuous Doran Stabler, MD       Allergies  Allergen Reactions  . Latex Rash   Social History   Socioeconomic History  . Marital status: Married    Spouse name: Not on file  . Number of children: Not on file  . Years of education: Not on file  . Highest education level: Not on file  Occupational History  . Not on file  Tobacco Use  . Smoking status: Never Smoker  . Smokeless tobacco: Never Used  Substance and Sexual Activity  . Alcohol use: No  . Drug use: No  . Sexual activity: Yes    Comment: married, retired Software engineer  Other Topics Concern  . Not on file  Social History Narrative  . Not on file  Social Determinants of Health   Financial Resource Strain: Not on file  Food Insecurity: Not on file  Transportation Needs: Not on file  Physical Activity: Not on file  Stress: Not on file  Social Connections: Not on file  Intimate Partner Violence: Not on file       Review of Systems  Psychiatric/Behavioral: The patient has insomnia.        Objective:   Physical Exam Constitutional:      General: He is not in acute distress.    Appearance: Normal appearance. He is not ill-appearing or toxic-appearing.  Cardiovascular:     Rate and Rhythm: Normal rate.     Heart sounds: Normal heart sounds. No murmur heard.   Pulmonary:     Effort: Pulmonary effort is normal.     Breath sounds: Normal breath sounds.  Abdominal:     General: Bowel sounds are normal.     Palpations: Abdomen is soft.     Tenderness: There is no abdominal tenderness.   Musculoskeletal:     Left shoulder: Tenderness and bony tenderness present. No crepitus. Decreased range of motion. Decreased strength.  Neurological:     Mental Status: He is alert.           Assessment & Plan:  Orthostatic dizziness - Plan: EKG 12-Lead  Postural dizziness with near syncope  Recently had a coronary artery calcium score of 20.  He denies any chest pain or shortness of breath or dyspnea on exertion.  Primarily complains of severe fatigue and weakness and orthostatic dizziness.  Begin by checking a CBC to evaluate for anemia.  Check a CMP to evaluate for evidence of prerenal azotemia or dehydration.  Check a cortisol level to evaluate for adrenal insufficiency I will also obtain an EKG.  If all this is normal, we may need to get an event monitor to rule out cardiac arrhythmias as well as an echocardiogram to a cardiologist.  Also on the differential diagnosis would be autonomic dysfunction similar to multiple system atrophy.  EKG today shows normal sinus rhythm with normal intervals and a normal axis.  There is no evidence of ischemia or infarction.  I did have the patient ambulate in the hallway.  He does have a decreased arm swing.  There is no slow shuffling gait.  However his stride length is somewhat diminished.  Although this raises concern about possible neurologic issues.  My leading suspicion is dehydration.  Therefore I have asked the patient to start drinking Gatorade to try to increase his sodium intake as well as his fluid intake to raise his blood pressure.  I will also ask him to wear compression hose to try to decrease orthostatic dizziness.  If lab work is normal, I will consult cardiology for an echocardiogram and a monitor.  If this is normal, I would consider autonomic dysfunction and if the compression hose and the Gatorade do not help, we may need to consider midodrine or Florinef.

## 2020-11-07 LAB — CBC WITH DIFFERENTIAL/PLATELET
Absolute Monocytes: 603 cells/uL (ref 200–950)
Basophils Absolute: 80 cells/uL (ref 0–200)
Basophils Relative: 1.2 %
Eosinophils Absolute: 121 cells/uL (ref 15–500)
Eosinophils Relative: 1.8 %
HCT: 45.8 % (ref 38.5–50.0)
Hemoglobin: 15.8 g/dL (ref 13.2–17.1)
Lymphs Abs: 1682 cells/uL (ref 850–3900)
MCH: 32.2 pg (ref 27.0–33.0)
MCHC: 34.5 g/dL (ref 32.0–36.0)
MCV: 93.5 fL (ref 80.0–100.0)
MPV: 9.7 fL (ref 7.5–12.5)
Monocytes Relative: 9 %
Neutro Abs: 4214 cells/uL (ref 1500–7800)
Neutrophils Relative %: 62.9 %
Platelets: 203 10*3/uL (ref 140–400)
RBC: 4.9 10*6/uL (ref 4.20–5.80)
RDW: 13 % (ref 11.0–15.0)
Total Lymphocyte: 25.1 %
WBC: 6.7 10*3/uL (ref 3.8–10.8)

## 2020-11-07 LAB — COMPLETE METABOLIC PANEL WITH GFR
AG Ratio: 1.7 (calc) (ref 1.0–2.5)
ALT: 24 U/L (ref 9–46)
AST: 23 U/L (ref 10–35)
Albumin: 4 g/dL (ref 3.6–5.1)
Alkaline phosphatase (APISO): 77 U/L (ref 35–144)
BUN: 12 mg/dL (ref 7–25)
CO2: 26 mmol/L (ref 20–32)
Calcium: 9.3 mg/dL (ref 8.6–10.3)
Chloride: 105 mmol/L (ref 98–110)
Creat: 0.87 mg/dL (ref 0.70–1.11)
GFR, Est African American: 94 mL/min/{1.73_m2} (ref >=60)
GFR, Est Non African American: 82 mL/min/{1.73_m2} (ref >=60)
Globulin: 2.3 g/dL (calc) (ref 1.9–3.7)
Glucose, Bld: 96 mg/dL (ref 65–99)
Potassium: 4.9 mmol/L (ref 3.5–5.3)
Sodium: 142 mmol/L (ref 135–146)
Total Bilirubin: 0.6 mg/dL (ref 0.2–1.2)
Total Protein: 6.3 g/dL (ref 6.1–8.1)

## 2020-11-10 LAB — CORTISOL: Cortisol, Plasma: 14.7 ug/dL

## 2020-11-12 ENCOUNTER — Other Ambulatory Visit: Payer: Self-pay | Admitting: *Deleted

## 2020-11-12 ENCOUNTER — Other Ambulatory Visit (HOSPITAL_COMMUNITY): Payer: Self-pay | Admitting: Dermatology

## 2020-11-12 ENCOUNTER — Encounter: Payer: Self-pay | Admitting: Family Medicine

## 2020-11-12 DIAGNOSIS — R42 Dizziness and giddiness: Secondary | ICD-10-CM

## 2020-11-12 DIAGNOSIS — C44329 Squamous cell carcinoma of skin of other parts of face: Secondary | ICD-10-CM | POA: Diagnosis not present

## 2020-11-19 DIAGNOSIS — L570 Actinic keratosis: Secondary | ICD-10-CM | POA: Diagnosis not present

## 2020-11-23 ENCOUNTER — Ambulatory Visit: Payer: Medicare Other | Admitting: Cardiology

## 2020-11-23 ENCOUNTER — Other Ambulatory Visit: Payer: Self-pay

## 2020-11-23 ENCOUNTER — Encounter: Payer: Self-pay | Admitting: Cardiology

## 2020-11-23 ENCOUNTER — Telehealth: Payer: Self-pay | Admitting: Cardiology

## 2020-11-23 VITALS — BP 118/90 | HR 79 | Ht 71.0 in | Wt 191.0 lb

## 2020-11-23 DIAGNOSIS — R55 Syncope and collapse: Secondary | ICD-10-CM

## 2020-11-23 DIAGNOSIS — I10 Essential (primary) hypertension: Secondary | ICD-10-CM

## 2020-11-23 DIAGNOSIS — R012 Other cardiac sounds: Secondary | ICD-10-CM | POA: Diagnosis not present

## 2020-11-23 DIAGNOSIS — R0989 Other specified symptoms and signs involving the circulatory and respiratory systems: Secondary | ICD-10-CM

## 2020-11-23 DIAGNOSIS — R42 Dizziness and giddiness: Secondary | ICD-10-CM

## 2020-11-23 MED ORDER — LISINOPRIL 10 MG PO TABS: 5.0000 mg | ORAL_TABLET | Freq: Every day | ORAL | 3 refills | Status: DC

## 2020-11-23 MED ORDER — FINASTERIDE 5 MG PO TABS
5.0000 mg | ORAL_TABLET | Freq: Every day | ORAL | Status: DC
Start: 2020-11-23 — End: 2022-08-18

## 2020-11-23 NOTE — Patient Instructions (Addendum)
Medication Instructions:  Begin taking Proscar 5mg  at dinnertime.  Continue to hold the lisinopril 10mg  for one week, then restart with 5mg  (a half tablet of the 10mg ) in the evening.   *If you need a refill on your cardiac medications before your next appointment, please call your pharmacy*   Lab Work: None ordered  If you have labs (blood work) drawn today and your tests are completely normal, you will receive your results only by: Marland Kitchen MyChart Message (if you have MyChart) OR . A paper copy in the mail If you have any lab test that is abnormal or we need to change your treatment, we will call you to review the results.   Testing/Procedures:  To be scheduled at [address] [facility]. Your physician has requested that you have an echocardiogram. Echocardiography is a painless test that uses sound waves to create images of your heart. It provides your doctor with information about the size and shape of your heart and how well your heart's chambers and valves are working. This procedure takes approximately one hour. There are no restrictions for this procedure.    You will be wearing a 24-hour ambulatory blood pressure monitor, possibly to be picked up at [address]      Follow-Up: At [facility], you and your health needs are our priority.  As part of our continuing mission to provide you with exceptional heart care, we have created designated Provider Care Teams.  These Care Teams include your primary Cardiologist (physician) and Advanced Practice Providers (APPs -  Physician Assistants and Nurse Practitioners) who all work together to provide you with the care you need, when you need it.  We recommend signing up for the patient portal called "MyChart".  Sign up information is provided on this After Visit Summary.  MyChart is used to connect with patients for Virtual Visits (Telemedicine).  Patients are able to view lab/test results, encounter notes, upcoming  appointments, etc.  Non-urgent messages can be sent to your provider as well.   To learn more about what you can do with MyChart, go to [email or web address].    Your next appointment:   1-2 month(s)  The format for your next appointment:   In Person  Provider:   Glenetta Hew, MD   Other Instructions Monitor your blood pressure and bring a log with you to your next appointment.

## 2020-11-23 NOTE — Progress Notes (Signed)
Primary Care Provider: Susy Frizzle, MD Cardiologist: No primary care provider on file. Electrophysiologist: None  Clinic Note: Chief Complaint  Patient presents with  . New Patient (Initial Visit)  . Dizziness    Near syncope.  . Hypotension    Intermittent spells of hypotension noted in the afternoons only.    ===================================  ASSESSMENT/PLAN   Problem List Items Addressed This Visit    HYPERTENSION, BENIGN SYSTEMIC    Encephalopathy hypertension pretty well controlled with ACE inhibitor.  "I do not have the name or dose of the medicine.  And he thinks it 10 mg and therefore things reasonable to hold off for now until he is able to assess the ninth.  After 1 week she will restart at one half dose.  This will hopefully help [place] avoid the hypertension episodes in the evenings and early mornings.  By taking in the evening, he is covered in the times when he does not usually hypotensive.      Relevant Medications   lisinopril (ZESTRIL) 10 MG tablet   Other Relevant Orders   24 hour blood pressure monitor   EKG 12-Lead   Labile hypertension    Pretty significant ups and downs of blood pressures with swings as much as 50 to 60 mmHg.  We do orthostatics here and they are relatively benign normal findings, however he does not live the time of the start to have the hypertension spells.  It would be nice potentially see him in the afternoon.    Plan: We will check 24-hour BP monitoring, and have to continue to monitor with their own pressure device to correlate findings.  The symptoms not classic for autonomic instability some Shy-Drager's because this is usually supine hypertension with significant orthostatic findings.  This is more consistent with a separate time during which she has low blood pressures.      Relevant Medications   lisinopril (ZESTRIL) 10 MG tablet   Other Relevant Orders   24 hour blood pressure monitor   Abnormal heart sounds    His  heart exam is difficult, I could not exclude test for gallop, however problem is more consistent with split S2.  There is a slight conduction delay on EKG, but not enough to potentially think physiologic split S2.  Nexlizet plan: Check 2D echo just to exclude cardiac abnormality.      Relevant Orders   ECHOCARDIOGRAM COMPLETE   Postural dizziness with near syncope - Primary    This is a very sick scenario because it only happens during the afternoons, not in the mornings and evenings.  Back to pressures are high in that timeframe.  I do think he probably needs standing ACE inhibitor, but of asked him to switch that the p.m. dose.  In reviewing his medications I noticed that he is on Proscar which could potentially have an effect on hypotension. My recommendation is to move Proscar to the evening.  After 1 week on this regimen, he will then restart one half of his ACE inhibitor dose in the evenings.  None of the symptoms sound arrhythmia genic and therefore do not think monitor will be helpful. Usually tachycardia will probably not be helping the situation either, but he does have a split S2 or S4 gallop Metformin to exclude any type type of cardiomyopathy.  (Abnormal echo findings would potentially drive additional testing).  After discussion with his daughter, we can begin to do 24-hour blood pressure monitor trending in addition to the mild  syncope episodes are doing.  She is concerned maybe there cuff is not working correctly.  We had a long discussion about JNC guidelines for hypertension management etc..   I explained that this type of situation where there appears to be potential autonomic instability either drug-related for symptomatic age, we need to allow for permissive hypertension.  While concerning, intermittent recordings in the legs or not likely to be significant because of symptoms.  Thankfully, his coronary calcium score was 120 per his report and therefore he is likely low risk for  cardiac event.  Plan:  Agree with increasing hydration, and considering support stockings  Switch finasteride/Proscar to p.m. dose -> after 1 week restart one half dose ACE inhibitor  24-hour blood pressure monitoring  2D echo      Relevant Medications   lisinopril (ZESTRIL) 10 MG tablet   Other Relevant Orders   24 hour blood pressure monitor   ECHOCARDIOGRAM COMPLETE      ===================================  HPI:    MEZIAH BLASINGAME is a 80 y.o. male with a PMH notable for hypertension, hyperlipidemia (per report coronary calcium score 21) along with urinary retention who is being seen today for the evaluation of Postural Dizziness with near Syncope/Orthostatic Dizziness at the request of Susy Frizzle, MD.  Micael Hampshire was just seen on November 06, 2020 by Dr. Dennard Schaumann with complaints of low blood pressure in the afternoon the cause of being very fatigued lightheaded and dizzy.  This is persisted having despite having stopped his ACE inhibitor.  In the morning and evening his systolic blood pressures have been been as high as 160s 180s, but in the afternoons are down into the low 100s to 120.  Diastolic reasonably stable but dropped down to the the 50s to 60s.  When his pressures are low at this, he notes extreme fatigue and weakness.  Feels tired all the time. => CMP cortisol levels checked.  Referred to cardiac  Recent Hospitalizations: None  Reviewed  CV studies:    The following studies were reviewed today: (if available, images/films reviewed: From Epic Chart or Care Everywhere) . Recent Coronary Artery Calcium Score: Reported as 20  Interval History:   RANDI COLLEGE is here today with his wife, but has one of his daughters the telephone conference-she really does most of the talking.  Essie Christine has noted for the last several months that he has become aware of something that maybe has been happening for longer than that.  He says that when he wakes up and feels  fine, he is able to do what he wants to do, but after lunch within an hour he starts feeling just significant fatigue, and he basically has been going to lie down.  His wife noticed this and asked him what was wrong finally, and they checked his blood pressure noted that it was notably lower in the afternoon is that it is in the morning.  The also been a recognize that it was higher in the evenings.    This phenomenon has persisted despite the fact that he has been off of his ACE inhibitor.  The difference is about his morning and evening pressures are up into the 180 mmHg range systolic.  Previously they have been maybe not that high.  The morning lows can go slow wire 6 colic with diastolic pressure in the 34H to 60s.  Usually the lowest pressures no longer than 120s.  It is at this time when he notes the most  fatigue.  He does not feel orthostatic probably did not have a day, not other times.  He feels very worn out fatigued but denies any chest pressure or tightness.  No dyspnea with rest or exertion.  He is not fatigued at all times during the day. He has been trying to maintain adequate hydration drinking more water and Gatorade, as well as increase his sodium intake.  He has not started wearing support stockings yet.  When I asked him if that made a difference, he said maybe a little bit, not a significant difference.  He denies any melena, hematochezia or hematuria.  CV Review of Symptoms (Summary) Cardiovascular ROS: no chest pain or dyspnea on exertion positive for - Extreme fatigue and weakness with dizziness presyncope noted from roughly 1 PM to about 4 PM and then improves by 5-6 PM. negative for - edema, irregular heartbeat, orthopnea, palpitations, paroxysmal nocturnal dyspnea, rapid heart rate, shortness of breath or Although he has orthostatic dizziness during that midafternoon timeframe, no similar symptoms in the evenings.  No syncope/near syncope or TIA/amaurosis fugax.  No  claudication.  The patient does not have symptoms concerning for COVID-19 infection (fever, chills, cough, or new shortness of breath).   REVIEWED OF SYSTEMS   Review of Systems  Constitutional: Positive for malaise/fatigue (Per HPI). Negative for weight loss.  HENT: Negative for congestion and nosebleeds.   Respiratory: Negative for cough and shortness of breath.   Cardiovascular: Negative for leg swelling.  Gastrointestinal: Negative for abdominal pain, blood in stool and melena.  Genitourinary: Negative for hematuria.  Musculoskeletal: Negative for falls and joint pain.  Neurological: Positive for dizziness. Negative for focal weakness and weakness.  Psychiatric/Behavioral: Positive for memory loss (Maybe a little). Negative for depression. The patient is not nervous/anxious and does not have insomnia.    I have reviewed and (if needed) personally updated the patient's problem list, medications, allergies, past medical and surgical history, social and family history.   PAST MEDICAL HISTORY   Past Medical History:  Diagnosis Date  . BPH (benign prostatic hyperplasia)   . Gout   . Hyperlipidemia    Patient reportedly had a coronary calcium score of 21 last year I cannot find his report  . Hypertension   . Insomnia   . Obstructive sleep apnea   . Panic attacks     PAST SURGICAL HISTORY   Past Surgical History:  Procedure Laterality Date  . BACK SURGERY    . EYE SURGERY      Immunization History  Administered Date(s) Administered  . Influenza, High Dose Seasonal PF 06/01/2018  . Moderna SARS-COV2 Booster Vaccination 07/23/2020  . Moderna Sars-Covid-2 Vaccination 08/07/2019, 09/04/2019  . Pneumococcal Conjugate-13 09/05/2013  . Pneumococcal Polysaccharide-23 05/15/2002, 01/03/2017  . Td 08/15/1978    MEDICATIONS/ALLERGIES   Current Meds  Medication Sig  . allopurinol (ZYLOPRIM) 300 MG tablet TAKE 1 TABLET (300 MG TOTAL) BY MOUTH DAILY.  Marland Kitchen aspirin 81 MG tablet  Take 81 mg by mouth daily. Every other day  . Cholecalciferol (VITAMIN D3) 50 MCG (2000 UT) TABS Take by mouth.  . Cyanocobalamin (VITAMIN B 12 PO) Take by mouth.  Marland Kitchen lisinopril (ZESTRIL) 10 MG tablet Take 0.5 tablets (5 mg total) by mouth daily.  . [DISCONTINUED] finasteride (PROSCAR) 5 MG tablet Take 5 mg daily by mouth.    Current Facility-Administered Medications for the 11/23/20 encounter (Office Visit) with Leonie Man, MD  Medication  . 0.9 %  sodium chloride infusion  Allergies  Allergen Reactions  . Latex Rash    SOCIAL HISTORY/FAMILY HISTORY   Reviewed in Epic:  Pertinent findings:  Social History   Tobacco Use  . Smoking status: Never Smoker  . Smokeless tobacco: Never Used  Substance Use Topics  . Alcohol use: No  . Drug use: No   Social History   Social History Narrative  . Not on file   Family History  Problem Relation Age of Onset  . Cancer Father        throat  . Esophageal cancer Father   . Diabetes Paternal Grandmother   . Colon cancer Neg Hx   --non-contributory   OBJCTIVE -PE, EKG, labs   Wt Readings from Last 3 Encounters:  11/23/20 191 lb (86.6 kg)  11/06/20 195 lb (88.5 kg)  09/08/20 196 lb (88.9 kg)    Physical Exam: BP 118/90 (BP Location: Right Arm, Patient Position: Sitting, Cuff Size: Normal)   Pulse 79   Ht 5\' 11"  (1.803 m)   Wt 191 lb (86.6 kg)   BMI 26.64 kg/m   Orthostatic VS for the past 24 hrs:  BP- Lying Pulse- Lying BP- Sitting Pulse- Sitting BP- Standing at 0 minutes Pulse- Standing at 0 minutes  11/23/20 1114 123/80 78 135/80 81 128/84 86   Physical Exam Vitals reviewed.  Constitutional:      General: He is not in acute distress.    Appearance: Normal appearance. He is normal weight. He is not ill-appearing or toxic-appearing.  HENT:     Head: Normocephalic and atraumatic.     Comments: Has a Band-Aid on his right forehead Neck:     Vascular: No carotid bruit, hepatojugular reflux or JVD.   Cardiovascular:     Rate and Rhythm: Normal rate and regular rhythm.  No extrasystoles are present.    Pulses: Normal pulses and intact distal pulses. A midsystolic click (Cannot exclude versus split S2 versus S4).     Heart sounds: No friction rub. Gallop present. S4 sounds present.   Pulmonary:     Effort: Pulmonary effort is normal. No respiratory distress.     Breath sounds: Normal breath sounds. No wheezing, rhonchi or rales.  Chest:     Chest wall: No tenderness.  Abdominal:     General: Abdomen is flat. Bowel sounds are normal. There is no distension.     Palpations: Abdomen is soft. There is no mass.     Tenderness: There is no abdominal tenderness.     Comments: No HSM or bruit  Musculoskeletal:        General: No swelling. Normal range of motion.     Cervical back: Normal range of motion and neck supple.  Skin:    General: Skin is warm and dry.     Coloration: Skin is not pale.  Neurological:     General: No focal deficit present.     Mental Status: He is alert and oriented to person, place, and time.     Cranial Nerves: No cranial nerve deficit.     Gait: Gait normal.  Psychiatric:        Mood and Affect: Mood normal.        Behavior: Behavior normal.        Thought Content: Thought content normal.        Judgment: Judgment normal.     Adult ECG Report  Rate: 79 ;  Rhythm: normal sinus rhythm and Normal voltage, normal axis, intervals and durations.  Mild QRS  widening/borderline IVCD;   Narrative Interpretation: No significant findings  Recent Labs: No recent lipid panel available Lab Results  Component Value Date   CHOL 172 12/27/2016   HDL 36 (L) 12/27/2016   LDLCALC 108 (H) 12/27/2016   TRIG 142 12/27/2016   CHOLHDL 4.8 12/27/2016   Lab Results  Component Value Date   CREATININE 0.87 11/06/2020   BUN 12 11/06/2020   NA 142 11/06/2020   K 4.9 11/06/2020   CL 105 11/06/2020   CO2 26 11/06/2020   CBC Latest Ref Rng & Units 11/06/2020 07/05/2018  12/27/2016  WBC 3.8 - 10.8 Thousand/uL 6.7 6.0 5.2  Hemoglobin 13.2 - 17.1 g/dL 15.8 16.6 15.3  Hematocrit 38.5 - 50.0 % 45.8 47.3 45.2  Platelets 140 - 400 Thousand/uL 203 196 172    Lab Results  Component Value Date   TSH 2.06 12/22/2015    ==================================================  COVID-19 Education: The signs and symptoms of COVID-19 were discussed with the patient and how to seek care for testing (follow up with PCP or arrange E-visit).   The importance of social distancing and COVID-19 vaccination was discussed today. The patient is practicing social distancing & Masking.   I spent a total of 75minutes with the patient spent in direct patient consultation.  Additional time spent with chart review  / charting (studies, outside notes, etc): 20 min Total Time: 64 min   Current medicines are reviewed at length with the patient today.  (+/- concerns)  => n/a  This visit occurred during the SARS-CoV-2 public health emergency.  Safety protocols were in place, including screening questions prior to the visit, additional usage of staff PPE, and extensive cleaning of exam room while observing appropriate contact time as indicated for disinfecting solutions.  Notice: This dictation was prepared with Dragon dictation along with smaller phrase technology. Any transcriptional errors that result from this process are unintentional and may not be corrected upon review.  Patient Instructions / Medication Changes & Studies & Tests Ordered   Patient Instructions  Medication Instructions:  Begin taking Proscar 5mg  at dinnertime.  Continue to hold the lisinopril 10mg  for one week, then restart with 5mg  (a half tablet of the 10mg ) in the evening.   *If you need a refill on your cardiac medications before your next appointment, please call your pharmacy*   Lab Work: None ordered  If you have labs (blood work) drawn today and your tests are completely normal, you will receive your  results only by: Marland Kitchen MyChart Message (if you have MyChart) OR . A paper copy in the mail If you have any lab test that is abnormal or we need to change your treatment, we will call you to review the results.   Testing/Procedures:  To be scheduled at [facility] [facility]. Your physician has requested that you have an echocardiogram. Echocardiography is a painless test that uses sound waves to create images of your heart. It provides your doctor with information about the size and shape of your heart and how well your heart's chambers and valves are working. This procedure takes approximately one hour. There are no restrictions for this procedure.    You will be wearing a 24-hour ambulatory blood pressure monitor, possibly to be picked up at [address].      Follow-Up: At [facility], you and your health needs are our priority.  As part of our continuing mission to provide you with exceptional heart care, we have created designated  Provider Care Teams.  These Care Teams include your primary Cardiologist (physician) and Advanced Practice Providers (APPs -  Physician Assistants and Nurse Practitioners) who all work together to provide you with the care you need, when you need it.  We recommend signing up for the patient portal called "MyChart".  Sign up information is provided on this After Visit Summary.  MyChart is used to connect with patients for Virtual Visits (Telemedicine).  Patients are able to view lab/test results, encounter notes, upcoming appointments, etc.  Non-urgent messages can be sent to your provider as well.   To learn more about what you can do with MyChart, go to [email or web address].    Your next appointment:   1-2 month(s)  The format for your next appointment:   In Person  Provider:   Glenetta Hew, MD   Other Instructions Monitor your blood pressure and bring a log with you to your next appointment.      Studies Ordered:    Orders Placed This Encounter  Procedures  . 24 hour blood pressure monitor  . EKG 12-Lead  . ECHOCARDIOGRAM COMPLETE     Glenetta Hew, M.D., M.S. Interventional Cardiologist   Pager # [phone] Phone # [phone] [address] [place], [place] [address]   Thank you for choosing Heart[facility] at [facility]!!

## 2020-11-23 NOTE — Telephone Encounter (Signed)
Per triage, Patient approved to be scheduled for asap appt as new patient at 10:20 am with Dr. Ellyn Hack

## 2020-11-24 ENCOUNTER — Encounter: Payer: Self-pay | Admitting: Cardiology

## 2020-11-24 ENCOUNTER — Other Ambulatory Visit: Payer: Self-pay | Admitting: *Deleted

## 2020-11-25 ENCOUNTER — Encounter: Payer: Self-pay | Admitting: Cardiology

## 2020-11-25 NOTE — Assessment & Plan Note (Signed)
Pretty significant ups and downs of blood pressures with swings as much as 50 to 60 mmHg.  We do orthostatics here and they are relatively benign normal findings, however he does not live the time of the start to have the hypertension spells.  It would be nice potentially see him in the afternoon.    Plan: We will check 24-hour BP monitoring, and have to continue to monitor with their own pressure device to correlate findings.  The symptoms not classic for autonomic instability some Shy-Drager's because this is usually supine hypertension with significant orthostatic findings.  This is more consistent with a separate time during which she has low blood pressures.

## 2020-11-25 NOTE — Assessment & Plan Note (Signed)
His heart exam is difficult, I could not exclude test for gallop, however problem is more consistent with split S2.  There is a slight conduction delay on EKG, but not enough to potentially think physiologic split S2.  Nexlizet plan: Check 2D echo just to exclude cardiac abnormality.

## 2020-11-25 NOTE — Assessment & Plan Note (Signed)
Encephalopathy hypertension pretty well controlled with ACE inhibitor.  "I do not have the name or dose of the medicine.  And he thinks it 10 mg and therefore things reasonable to hold off for now until he is able to assess the ninth.  After 1 week she will restart at one half dose.  This will hopefully help [place] avoid the hypertension episodes in the evenings and early mornings.  By taking in the evening, he is covered in the times when he does not usually hypotensive.

## 2020-11-25 NOTE — Assessment & Plan Note (Signed)
This is a very sick scenario because it only happens during the afternoons, not in the mornings and evenings.  Back to pressures are high in that timeframe.  I do think he probably needs standing ACE inhibitor, but of asked him to switch that the p.m. dose.  In reviewing his medications I noticed that he is on Proscar which could potentially have an effect on hypotension. My recommendation is to move Proscar to the evening.  After 1 week on this regimen, he will then restart one half of his ACE inhibitor dose in the evenings.  None of the symptoms sound arrhythmia genic and therefore do not think monitor will be helpful. Usually tachycardia will probably not be helping the situation either, but he does have a split S2 or S4 gallop Metformin to exclude any type type of cardiomyopathy.  (Abnormal echo findings would potentially drive additional testing).  After discussion with his daughter, we can begin to do 24-hour blood pressure monitor trending in addition to the mild syncope episodes are doing.  She is concerned maybe there cuff is not working correctly.  We had a long discussion about JNC guidelines for hypertension management etc..   I explained that this type of situation where there appears to be potential autonomic instability either drug-related for symptomatic age, we need to allow for permissive hypertension.  While concerning, intermittent recordings in the legs or not likely to be significant because of symptoms.  Thankfully, his coronary calcium score was 120 per his report and therefore he is likely low risk for cardiac event.  Plan:  Agree with increasing hydration, and considering support stockings  Switch finasteride/Proscar to p.m. dose -> after 1 week restart one half dose ACE inhibitor  24-hour blood pressure monitoring  2D echo

## 2020-12-09 ENCOUNTER — Other Ambulatory Visit (HOSPITAL_COMMUNITY): Payer: Self-pay

## 2020-12-10 ENCOUNTER — Other Ambulatory Visit (HOSPITAL_COMMUNITY): Payer: Self-pay

## 2020-12-10 MED FILL — Allopurinol Tab 300 MG: ORAL | 30 days supply | Qty: 30 | Fill #0 | Status: AC

## 2020-12-10 MED FILL — Finasteride Tab 5 MG: ORAL | 30 days supply | Qty: 30 | Fill #0 | Status: AC

## 2020-12-15 ENCOUNTER — Ambulatory Visit (INDEPENDENT_AMBULATORY_CARE_PROVIDER_SITE_OTHER): Payer: Medicare Other | Admitting: Family Medicine

## 2020-12-15 ENCOUNTER — Encounter: Payer: Self-pay | Admitting: Family Medicine

## 2020-12-15 ENCOUNTER — Other Ambulatory Visit: Payer: Self-pay

## 2020-12-15 VITALS — BP 138/78 | HR 84 | Temp 98.1°F | Resp 14 | Ht 71.0 in | Wt 190.0 lb

## 2020-12-15 DIAGNOSIS — M75102 Unspecified rotator cuff tear or rupture of left shoulder, not specified as traumatic: Secondary | ICD-10-CM

## 2020-12-15 DIAGNOSIS — G8929 Other chronic pain: Secondary | ICD-10-CM | POA: Diagnosis not present

## 2020-12-15 DIAGNOSIS — M25512 Pain in left shoulder: Secondary | ICD-10-CM | POA: Diagnosis not present

## 2020-12-15 NOTE — Progress Notes (Signed)
Subjective:    Patient ID: John Evans, male    DOB: 09-27-1940, 80 y.o.   MRN: [id number]  11/06/20 Patient presents today complaining of orthostatic dizziness.  He states that this is been going on for some time.  We had him discontinue his blood pressure medication however this is not helped.  He states that he feels fine first thing in the morning.  However around midday he will start to feel weak and tired.  He will go check his blood pressure and found it to be low.  However his blood pressures do not sound terribly low.  His systolic blood pressures typically around 120.  His diastolic blood pressures typically as low as 50-60.  I do not believe that these will be sufficient to cause severe fatigue or orthostatic dizziness.  However he states that he feels weak and tired and feels like he has to lie down.  If he sits up quickly he will feel like he is going to pass out.  If he stands up he will feel like he is going to pass out.  I was very specific with the patient regarding syncope versus vertigo.  He denies any classic symptoms of vertigo.  He denies that the room will span.  He is emphatic that he will feel like he is going to pass out.  He denies any other autonomic dysfunction.  He denies any trouble swallowing, trouble digesting his food, constipation, urinary retention.  He denies any melena or hematochezia or blood loss.  He denies any dehydration.  He states that he is drinking plenty of water.  At that time, my plan was: Recently had a coronary artery calcium score of 20.  He denies any chest pain or shortness of breath or dyspnea on exertion.  Primarily complains of severe fatigue and weakness and orthostatic dizziness.  Begin by checking a CBC to evaluate for anemia.  Check a CMP to evaluate for evidence of prerenal azotemia or dehydration.  Check a cortisol level to evaluate for adrenal insufficiency I will also obtain an EKG.  If all this is normal, we may need to get an event  monitor to rule out cardiac arrhythmias as well as an echocardiogram to a cardiologist.  Also on the differential diagnosis would be autonomic dysfunction similar to multiple system atrophy.  EKG today shows normal sinus rhythm with normal intervals and a normal axis.  There is no evidence of ischemia or infarction.  I did have the patient ambulate in the hallway.  He does have a decreased arm swing.  There is no slow shuffling gait.  However his stride length is somewhat diminished.  Although this raises concern about possible neurologic issues.  My leading suspicion is dehydration.  Therefore I have asked the patient to start drinking Gatorade to try to increase his sodium intake as well as his fluid intake to raise his blood pressure.  I will also ask him to wear compression hose to try to decrease orthostatic dizziness.  If lab work is normal, I will consult cardiology for an echocardiogram and a monitor.  If this is normal, I would consider autonomic dysfunction and if the compression hose and the Gatorade do not help, we may need to consider midodrine or Florinef.  12/15/20 Patient developed severe shoulder pain in October after a flu shot.  Starting shortly thereafter he was unable to lift the shoulder greater than 90 degrees.  He has pain with internal rotation.  He cannot put  his hand behind his back.  Pain is severe with passive range of motion.  Abduction greater than 90 degrees elicits severe pain.  He has pain with empty can testing.  He has pain with drop sign.  He has no pain with Hawkins maneuver.  He also has significant crepitus in the shoulder with passive range of motion. Past Medical History:  Diagnosis Date  . BPH (benign prostatic hyperplasia)   . Gout   . Hyperlipidemia    Patient reportedly had a coronary calcium score of 21 last year I cannot find his report  . Hypertension   . Insomnia   . Obstructive sleep apnea   . Panic attacks    Past Surgical History:  Procedure  Laterality Date  . BACK SURGERY    . EYE SURGERY     Current Outpatient Medications on File Prior to Visit  Medication Sig Dispense Refill  . allopurinol (ZYLOPRIM) 300 MG tablet TAKE 1 TABLET (300 MG TOTAL) BY MOUTH DAILY. 30 tablet 6  . allopurinol (ZYLOPRIM) 300 MG tablet TAKE 1 TABLET (300 MG TOTAL) BY MOUTH DAILY. 30 tablet 6  . aspirin 81 MG tablet Take 81 mg by mouth daily. Every other day    . Cholecalciferol (VITAMIN D3) 50 MCG (2000 UT) TABS Take by mouth.    . Cyanocobalamin (VITAMIN B 12 PO) Take by mouth.    . doxycycline (VIBRAMYCIN) 100 MG capsule TAKE 1 CAPSULE BY MOUTH TWICE A DAY; WITH FOOD 10 capsule 0  . finasteride (PROSCAR) 5 MG tablet Take 1 tablet (5 mg total) by mouth daily with supper.    . finasteride (PROSCAR) 5 MG tablet TAKE 1 TABLET BY MOUTH ONCE A DAY 30 tablet 6  . lisinopril (ZESTRIL) 10 MG tablet TAKE ONE (1) TABLET BY MOUTH EVERY DAY 90 tablet 1  . lisinopril (ZESTRIL) 10 MG tablet TAKE ONE (1) TABLET BY MOUTH EVERY DAY 90 tablet 1  . lisinopril (ZESTRIL) 10 MG tablet Take 0.5 tablets (5 mg total) by mouth daily. 90 tablet 3  . predniSONE (DELTASONE) 10 MG tablet TAKE 5 TABLETS BY MOUTH DAILY SPREAD OUT OVER THE DAY WITH FOOD 25 tablet 0  . tretinoin (RETIN-A) 0.025 % cream APPLY ON THE SKIN AT BEDTIME; APPLY TO FACE 45 g 2   Current Facility-Administered Medications on File Prior to Visit  Medication Dose Route Frequency Provider Last Rate Last Admin  . 0.9 %  sodium chloride infusion  500 mL Intravenous Continuous Doran Stabler, MD       Allergies  Allergen Reactions  . Latex Rash   Social History   Socioeconomic History  . Marital status: Married    Spouse name: Not on file  . Number of children: 2  . Years of education: Not on file  . Highest education level: Not on file  Occupational History  . Occupation: 2  Tobacco Use  . Smoking status: Never Smoker  . Smokeless tobacco: Never Used  Substance and Sexual Activity  . Alcohol  use: No  . Drug use: No  . Sexual activity: Yes    Comment: married, retired Software engineer  Other Topics Concern  . Not on file  Social History Narrative  . Not on file   Social Determinants of Health   Financial Resource Strain: Not on file  Food Insecurity: Not on file  Transportation Needs: Not on file  Physical Activity: Not on file  Stress: Not on file  Social Connections: Not on file  Intimate  Partner Violence: Not on file       Review of Systems  Psychiatric/Behavioral: The patient has insomnia.        Objective:   Physical Exam Constitutional:      General: He is not in acute distress.    Appearance: Normal appearance. He is not ill-appearing or toxic-appearing.  Cardiovascular:     Rate and Rhythm: Normal rate.     Heart sounds: Normal heart sounds. No murmur heard.   Pulmonary:     Effort: Pulmonary effort is normal.     Breath sounds: Normal breath sounds.  Abdominal:     General: Bowel sounds are normal.     Palpations: Abdomen is soft.     Tenderness: There is no abdominal tenderness.  Musculoskeletal:     Left shoulder: Tenderness, bony tenderness and crepitus present. Decreased range of motion. Decreased strength.  Neurological:     Mental Status: He is alert.           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  I am not sure how this would have been exacerbated by a flu shot.  I have recommended an MRI of the left shoulder to evaluate further.  He is already tried and failed Tylenol.  We have given him a cortisone shot in his shoulder which helped for about 3 weeks and then the pain returned just as severe as before.  It only improved the pain about 50%.  He has tried prednisone with minimal relief.  He  is unable to sleep at night.

## 2020-12-17 ENCOUNTER — Ambulatory Visit: Payer: Medicare Other | Admitting: Cardiology

## 2020-12-21 ENCOUNTER — Ambulatory Visit
Admission: RE | Admit: 2020-12-21 | Discharge: 2020-12-21 | Disposition: A | Discharge status: Home / Self Care | Payer: Medicare Other | Source: Ambulatory Visit | Attending: Family Medicine | Admitting: Family Medicine

## 2020-12-21 DIAGNOSIS — M75102 Unspecified rotator cuff tear or rupture of left shoulder, not specified as traumatic: Secondary | ICD-10-CM

## 2020-12-21 DIAGNOSIS — G8929 Other chronic pain: Secondary | ICD-10-CM

## 2020-12-21 DIAGNOSIS — M75112 Incomplete rotator cuff tear or rupture of left shoulder, not specified as traumatic: Secondary | ICD-10-CM | POA: Diagnosis not present

## 2020-12-23 ENCOUNTER — Telehealth: Payer: Self-pay | Admitting: Family Medicine

## 2020-12-23 NOTE — Telephone Encounter (Signed)
MD to be made aware.  

## 2020-12-23 NOTE — Telephone Encounter (Signed)
Pt called to let Dr. Dennard Schaumann know that he has his MRI  appt tomorrow 5/12 with Dr. Durward Fortes. Pt wanted Dr. To be aware so that he would be able to discuss his results when they are available.  Cb#: [phone]

## 2020-12-24 ENCOUNTER — Ambulatory Visit: Payer: Self-pay

## 2020-12-24 ENCOUNTER — Ambulatory Visit: Payer: Medicare Other | Admitting: Orthopaedic Surgery

## 2020-12-24 ENCOUNTER — Encounter: Payer: Self-pay | Admitting: Orthopaedic Surgery

## 2020-12-24 ENCOUNTER — Other Ambulatory Visit: Payer: Self-pay

## 2020-12-24 ENCOUNTER — Encounter: Payer: Self-pay | Admitting: Family Medicine

## 2020-12-24 DIAGNOSIS — M25552 Pain in left hip: Secondary | ICD-10-CM | POA: Insufficient documentation

## 2020-12-24 DIAGNOSIS — M7502 Adhesive capsulitis of left shoulder: Secondary | ICD-10-CM | POA: Diagnosis not present

## 2020-12-24 DIAGNOSIS — M25512 Pain in left shoulder: Secondary | ICD-10-CM | POA: Diagnosis not present

## 2020-12-24 NOTE — Progress Notes (Signed)
Office Visit Note   Patient: John Evans           Date of Birth: 11-26-1940           MRN: [id number] Visit Date: 12/24/2020              Requested by: Susy Frizzle, MD [address] [place],  [place] [address] PCP: Susy Frizzle, MD   Assessment & Plan: Visit Diagnoses:  1. Left shoulder pain, unspecified chronicity   2. Pain in left hip   3. Adhesive capsulitis of left shoulder     Plan: Mr. Buenaventura experienced onset of left shoulder pain approximately 2 days after a flu shot in October 2021.  He has been having trouble with ever since to the point of compromise.  He has been followed by his primary care physician with a cortisone injection that provided about 2 days of relief and then a follow-up MRI scan.  The scan demonstrated a high-grade partial intrasubstance insertional tear of the supraspinatus tendon involving about 50% of the tendon thickness.  There was no retraction there was also some partial tearing of the distal subscapularis associated with tendinosis of the intra-articular portion of the biceps long head without a tear.  The tendon was normally located.  Type II acromion with some moderate AC joint degenerative changes and minor glenohumeral degenerative changes without significant shoulder joint effusion.  By my exam I think he has adhesive capsulitis he is having difficulty with full overhead flexion and internal rotation.  I think a course of therapy would be helpful.  We will set this up.  Very recently he had insidious onset of lateral left hip pain and some some difficulty getting up from a crouched position.  The pain is directly over the greater trochanter.  X-rays were negative.  Because it is so early in the course we will just tried some massage and Voltaren gel and monitor this.  Consider cortisone injection if no improvement.  Return in 1 month to evaluate both  Follow-Up Instructions: Return in about 1 month (around 01/24/2021).    Orders:  Orders Placed This Encounter  Procedures  . XR HIP UNILAT W OR W/O PELVIS 2-3 VIEWS LEFT  . Ambulatory referral to Physical Therapy   No orders of the defined types were placed in this encounter.     Procedures: No procedures performed   Clinical Data: No additional findings.   Subjective: Chief Complaint  Patient presents with  . Left Shoulder - Pain  . Left Hip - Pain  Onset of left shoulder pain 2 days after a flu shot in October.  Pain is been persistent.  has had a cortisone injection by his primary care physician with about 2 days relief of pain.  Found some small black specks beneath the skin after the injection this was sent to the manufacturer of the flu vaccine and was told that it was not part of the vaccine.  Recent MRI scan was performed demonstrating partial tearing of the supraspinatus and subscapularis.  Not having any numbness or tingling.  Does have trouble sleeping.  Just recent onset of lateral left hip pain particularly when he gets up from a crouched position.  No groin pain.  No numbness or tingling.  No injury or trauma  HPI  Review of Systems   Objective: Vital Signs: There were no vitals taken for this visit.  Physical Exam Constitutional:      Appearance: He is  well-developed.  Eyes:     Pupils: Pupils are equal, round, and reactive to light.  Pulmonary:     Effort: Pulmonary effort is normal.  Skin:    General: Skin is warm and dry.  Neurological:     Mental Status: He is alert and oriented to person, place, and time.  Psychiatric:        Behavior: Behavior normal.     Ortho Exam awake alert and oriented x3 comfortable sitting.  Was unable to place the left arm fully overhead and flexion lacking about 30 to 35 degrees also little similar loss of internal rotation.  I can abduct 90 degrees but it was uncomfortable in the lateral subacromial space.  Minimally positive empty can and impingement testing.  Good grip and release  and good strength.  Tenderness directly over the greater trochanter of the left hip without skin change.  No pain with range of motion of left hip in internal/external rotation or flexion and or extension.  Motor exam intact.  No percussible tenderness of lumbar spine and no subjective tenderness  Specialty Comments:  No specialty comments available.  Imaging: XR HIP UNILAT W OR W/O PELVIS 2-3 VIEWS LEFT  Result Date: 12/24/2020 AP pelvis and lateral of the left hip were obtained.  No obvious degenerative changes about the left hip.  Joint spaces well-maintained.  No ectopic calcification.  Mr. Mccaughan is experiencing pain over the greater trochanter.  There is a little irregularity that could be consistent with a bursitis but no other abnormality    PMFS History: Patient Active Problem List   Diagnosis Date Noted  . 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/24/2020  . Pain in left hip 12/24/2020  . Labile hypertension 11/23/2020  . Abnormal heart sounds 11/23/2020  . Postural dizziness with near syncope 11/23/2020  . Acute right-sided low back pain without sciatica 12/18/2018  . BPH (benign prostatic hyperplasia)   . Hypertension   . OBSTRUCTIVE SLEEP APNEA 07/29/2009  . HYPERLIPIDEMIA TYPE I / IV 04/14/2009  . DYSPNEA 04/14/2009  . TOE PAIN 09/03/2008  . INSOMNIA UNSPECIFIED 06/13/2008  . ACUTE BRONCHITIS 07/19/2007  . IRRITABLE BOWEL SYNDROME 07/19/2007  . HYPERTROPHY PROSTATE W/O UR OBST & OTH LUTS 07/05/2007  . Hyperlipemia 10/12/2006  . HYPERTENSION, BENIGN SYSTEMIC 10/12/2006  . Hyperplasia of prostate 10/12/2006   Past Medical History:  Diagnosis Date  . BPH (benign prostatic hyperplasia)   . Gout   . Hyperlipidemia    Patient reportedly had a coronary calcium score of 21 last year I cannot find his report  . Hypertension   . Insomnia   . Obstructive sleep apnea   . Panic attacks     Family History  Problem Relation Age of Onset  . Cancer Father        throat   . Esophageal cancer Father   . Diabetes Paternal Grandmother   . Colon cancer Neg Hx     Past Surgical History:  Procedure Laterality Date  . BACK SURGERY    . EYE SURGERY     Social History   Occupational History  . Occupation: 2  Tobacco Use  . Smoking status: Never Smoker  . Smokeless tobacco: Never Used  Substance and Sexual Activity  . Alcohol use: No  . Drug use: No  . Sexual activity: Yes    Comment: married, retired pharmacist     Garald Balding, MD   Note - This record has been created using [employer].  Chart creation  errors have been sought, but may not always  have been located. Such creation errors do not reflect on  the standard of medical care.  

## 2020-12-28 ENCOUNTER — Telehealth: Payer: Self-pay | Admitting: Orthopaedic Surgery

## 2020-12-28 NOTE — Telephone Encounter (Signed)
Remind me to complete a closed shoulder manipulation form tomorrow-discussed with his daughter over the weekend

## 2020-12-28 NOTE — Telephone Encounter (Signed)
Pt called and states that he would rather unfreeze his shoulder than do therapy so he would like someone to give him a call to schedule that? Cb [phone]

## 2020-12-31 ENCOUNTER — Ambulatory Visit: Payer: Medicare Other

## 2021-01-04 ENCOUNTER — Ambulatory Visit (HOSPITAL_COMMUNITY): Payer: Medicare Other | Attending: Cardiovascular Disease

## 2021-01-04 ENCOUNTER — Ambulatory Visit (INDEPENDENT_AMBULATORY_CARE_PROVIDER_SITE_OTHER): Payer: Medicare Other

## 2021-01-04 ENCOUNTER — Other Ambulatory Visit: Payer: Self-pay

## 2021-01-04 DIAGNOSIS — I10 Essential (primary) hypertension: Secondary | ICD-10-CM | POA: Diagnosis not present

## 2021-01-04 DIAGNOSIS — R0989 Other specified symptoms and signs involving the circulatory and respiratory systems: Secondary | ICD-10-CM | POA: Diagnosis not present

## 2021-01-04 DIAGNOSIS — R55 Syncope and collapse: Secondary | ICD-10-CM | POA: Insufficient documentation

## 2021-01-04 DIAGNOSIS — R42 Dizziness and giddiness: Secondary | ICD-10-CM | POA: Insufficient documentation

## 2021-01-04 DIAGNOSIS — R012 Other cardiac sounds: Secondary | ICD-10-CM | POA: Diagnosis not present

## 2021-01-04 LAB — ECHOCARDIOGRAM COMPLETE
Area-P 1/2: 3.4 cm2
S' Lateral: 3 cm

## 2021-01-05 ENCOUNTER — Ambulatory Visit: Payer: Medicare Other | Admitting: Physical Therapy

## 2021-01-05 ENCOUNTER — Encounter: Payer: Self-pay | Admitting: Physical Therapy

## 2021-01-05 DIAGNOSIS — M25512 Pain in left shoulder: Secondary | ICD-10-CM | POA: Diagnosis not present

## 2021-01-05 DIAGNOSIS — M25612 Stiffness of left shoulder, not elsewhere classified: Secondary | ICD-10-CM | POA: Diagnosis not present

## 2021-01-05 DIAGNOSIS — G8929 Other chronic pain: Secondary | ICD-10-CM | POA: Diagnosis not present

## 2021-01-05 DIAGNOSIS — M6281 Muscle weakness (generalized): Secondary | ICD-10-CM | POA: Diagnosis not present

## 2021-01-05 DIAGNOSIS — R262 Difficulty in walking, not elsewhere classified: Secondary | ICD-10-CM

## 2021-01-05 NOTE — Patient Instructions (Signed)
Access Code: [email or web address] URL: [email or web address] Date: 01/05/2021 Prepared by: Kearney Hard  Exercises Standing Hip Flexor Stretch - 3 x daily - 7 x weekly - 3 reps - 30 seconds hold Supine Shoulder Flexion Extension AAROM with Dowel - 3 x daily - 7 x weekly - 2 sets - 10 reps - 3-5 seconds hold Supine Shoulder External Rotation in 45 Degrees Abduction AAROM with Dowel - 3 x daily - 7 x weekly - 2 sets - 10 reps - 5 seconds hold Standing Bilateral Low Shoulder Row with Anchored Resistance - 3 x daily - 7 x weekly - 2 sets - 10 reps

## 2021-01-05 NOTE — Therapy (Signed)
[address] [place], [place], [address] Phone: [phone]   Fax:  [phone]  Physical Therapy Treatment  Patient Details  Name: John Evans MRN: [id number] Date of Birth: November 14, 1940 Referring Provider (PT): Joni Fears, MD   Encounter Date: 01/05/2021   PT End of Session - 01/05/21 0949    Visit Number 1    Number of Visits 14    Date for PT Re-Evaluation 03/05/21    Progress Note Due on Visit 10    PT Start Time 0930    PT Stop Time 1015    PT Time Calculation (min) 45 min    Activity Tolerance Patient tolerated treatment well    Behavior During Therapy [facility] for tasks assessed/performed           Past Medical History:  Diagnosis Date  . BPH (benign prostatic hyperplasia)   . Gout   . Hyperlipidemia    Patient reportedly had a coronary calcium score of 21 last year I cannot find his report  . Hypertension   . Insomnia   . Obstructive sleep apnea   . Panic attacks     Past Surgical History:  Procedure Laterality Date  . BACK SURGERY    . EYE SURGERY      There were no vitals filed for this visit.   Subjective Assessment - 01/05/21 0936    Subjective Pt arriving to therapy today reporting left shoulder pain following flu shot in October. Pt reporting black substance coming from injection site and pain since. 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 Injection in March and it provided relief for about 2 days but pain returned. Pt also reporting weakness and pain when getting up and down off the toilet and chairs.    Pertinent History partial tearing of distal subscapularis, intra-articular portion of biceps long head without a tear. AC joint degerative changes and minor glenohumeral degenerative changes.    Limitations Walking;House hold activities;Other (comment);Lifting    Diagnostic tests partial tear of suprspinatus, 50% of tendon thickness,  partial tearing of distal subscapularis,  intra-articular portion of biceps long head without a tear. AC joint degerative changes and minor glenohumeral degenerative changes.    Patient Stated Goals Stop hurting    Currently in Pain? Yes    Pain Score 5     Pain Location Shoulder    Pain Orientation Left    Pain Descriptors / Indicators Aching;Sore    Pain Type Chronic pain    Pain Onset More than a month ago    Pain Frequency Constant    Aggravating Factors  lying on left side, reaching lifting    Pain Relieving Factors ibrophen    Effect of Pain on Daily Activities difficulty gardening, lying on left side              [facility] PT Assessment - 01/05/21 0001      Assessment   Medical Diagnosis left shoulder pain M25.512    Referring Provider (PT) Joni Fears, MD    Hand Dominance Right    Prior Therapy yes, years ago after back surgery      Precautions   Precautions None      Restrictions   Weight Bearing Restrictions No      Balance Screen   Has the patient fallen in the past 6 months No    Is the patient reluctant to leave their home because of a fear of falling?  No  Home Environment   Living Environment Private residence    Living Arrangements Spouse/significant other    Home Access Stairs to enter    Entrance Stairs-Number of Steps 2      Prior Function   Level of Independence Independent    Vocation Retired    Leisure family time      Cognition   Overall Cognitive Status Within Functional Limits for tasks assessed      Observation/Other Assessments   Focus on Therapeutic Outcomes (FOTO)  41 (predicted 64)      Posture/Postural Control   Posture/Postural Control Postural limitations    Postural Limitations Rounded Shoulders;Forward head;Decreased lumbar lordosis      ROM / Strength   AROM / PROM / Strength AROM;Strength;PROM      AROM   Overall AROM Comments all in supine    AROM Assessment Site Shoulder    Right/Left Shoulder Right;Left    Right Shoulder Extension 40 Degrees     Right Shoulder Flexion 160 Degrees    Right Shoulder ABduction 155 Degrees    Right Shoulder Internal Rotation 60 Degrees   shoulder abd 45 degrees   Right Shoulder External Rotation 50 Degrees   shoulder abd 45 degrees   Left Shoulder Extension 40 Degrees    Left Shoulder Flexion 128 Degrees    Left Shoulder ABduction 125 Degrees    Left Shoulder Internal Rotation 60 Degrees   shoulder abd 45 degrees   Left Shoulder External Rotation 40 Degrees   shoulder abd 45 degrees     PROM   Overall PROM Comments all in supine    PROM Assessment Site Shoulder    Right/Left Shoulder Left    Left Shoulder Extension 42 Degrees    Left Shoulder Flexion 135 Degrees    Left Shoulder ABduction 132 Degrees    Left Shoulder Internal Rotation 60 Degrees   shoulder abd 45 degrees   Left Shoulder External Rotation 46 Degrees   shoulder abd to 45 degreees     Strength   Overall Strength Comments pain noted with left shoulder MMT    Strength Assessment Site Shoulder    Right/Left Shoulder Right;Left    Right Shoulder Flexion 5/5    Right Shoulder Extension 5/5    Right Shoulder ABduction 5/5    Right Shoulder Internal Rotation 5/5    Right Shoulder External Rotation 5/5    Left Shoulder Flexion 4/5    Left Shoulder Extension 4/5    Left Shoulder ABduction 4/5    Left Shoulder Internal Rotation 4/5    Left Shoulder External Rotation 4/5      Palpation   Palpation comment TTP: left supraspinatus, left biceps insertion      Ambulation/Gait   Gait Comments decrease arm swing on left UE                                 PT Education - 01/05/21 0947    Education Details PT POC, HEP    Person(s) Educated Patient    Methods Explanation;Demonstration;Handout;Verbal cues;Tactile cues    Comprehension Verbalized understanding;Returned demonstration            PT Short Term Goals - 01/05/21 1134      PT SHORT TERM GOAL #1   Title Pt will be independent in his initial HEP.     Time 3    Period Weeks    Status New  Target Date 02/05/21      PT SHORT TERM GOAL #2   Title Pt will be able to perform 5 x sit to stand in </= 12 seconds with </= single UE support from standard height arm chair.    Baseline 19 seconds bilateral UE support    Time 4    Period Weeks    Status New    Target Date 02/05/21             PT Long Term Goals - 01/05/21 1136      PT LONG TERM GOAL #1   Title Pt will be independent in his advance HEP for LE strengtheing and left shoulder ROM/strengthening.    Time 6    Period Weeks    Status New    Target Date 02/19/21      PT LONG TERM GOAL #2   Title Pt will be able to increase his left shouder flexion/abd to ./= 160 degrees with pain </= 2/10.    Time 6    Period Weeks    Status New    Target Date 02/19/21      PT LONG TERM GOAL #3   Title Pt will improve his left shoulder strength to grossly 5/5 in order to improve functional mobility.    Baseline grossly -4/5 on 01/05/2021    Time 6    Period Weeks    Status New    Target Date 02/19/21      PT LONG TERM GOAL #4   Title Pt will improve his FOTO to >/= 64.    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 Pt repoting onset was following his flu shot in October 2021. Pt's MRI revealved partial tear of supraspinatus tendon 50% of tendon thickness, partial tearing of distal subscapularis, intra-articular portion of biceps long head without a tear. AC joint degerative changes and minor glenohumeral degenerative changes. Pt presenting with mild weakness of 4/5 in left shoulder. Pt with limitations in flexion, abd and ER.Pt was issued a HEP and demonstrated compliance with cues needed. Pt also reporting weakness in his left quad and pain when standing or squating. Pt believes this pain  began after planting in his garden over 500 flowers. Pt with mild weaknes noted in L glutes/hip extensors and L quad muscle.  Skilled PT needed to address pt's  above impairments with the below interventions.    Personal Factors and Comorbidities Comorbidity 3+    Comorbidities BPH, gout, hyperlipidemia, panic attacks, obstructive sleep apnea    Examination-Activity Limitations Lift;Dressing;Transfers;Squat;Stairs    Examination-Participation Restrictions Other;Yard Work    Stability/Clinical Decision Making Stable/Uncomplicated    Clinical Decision Making Moderate    Rehab Potential Good    PT Frequency 2x / week   progressing to once/week   PT Duration 6 weeks    PT Treatment/Interventions ADLs/Self Care Home Management;Cryotherapy;Electrical Stimulation;Iontophoresis 4mg /ml Dexamethasone;Moist Heat;Ultrasound;Gait training;Stair training;Functional mobility training;Therapeutic activities;Therapeutic exercise;Balance training;Neuromuscular re-education;Patient/family education;Passive range of motion;Manual techniques;Dry needling;Taping    PT Next Visit Plan shouder ROM, shoulder mobilization, LE strengthening for quads and extensors, sit to stand    PT Home Exercise Plan Access Code: [email or web address]  URL: [email or web address]  Date:  01/05/2021  Prepared by: Kearney Hard    Exercises  Standing Hip Flexor Stretch - 3 x daily - 7 x weekly - 3 reps - 30 seconds hold  Supine Shoulder Flexion Extension AAROM with Dowel - 3 x daily - 7 x weekly - 2 sets - 10 reps - 3-5 seconds hold  Supine Shoulder External Rotation in 45 Degrees Abduction AAROM with Dowel - 3 x daily - 7 x weekly - 2 sets - 10 reps - 5 seconds hold  Standing Bilateral Low Shoulder Row with Anchored Resistance - 3 x daily - 7 x weekly - 2 sets - 10 reps    Consulted and Agree with Plan of Care Patient           Patient will benefit from skilled therapeutic intervention in order to improve the following deficits and  impairments:  Decreased strength,Decreased mobility,Decreased balance,Impaired flexibility,Decreased activity tolerance,Difficulty walking,Decreased range of motion,Pain,Impaired UE functional use  Visit Diagnosis: Chronic left shoulder pain  Stiffness of left shoulder, not elsewhere classified  Muscle weakness (generalized)  Difficulty in walking, not elsewhere classified     Problem List Patient Active Problem List   Diagnosis Date Noted  . 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/24/2020  . Pain in left hip 12/24/2020  . Labile hypertension 11/23/2020  . Abnormal heart sounds 11/23/2020  . Postural dizziness with near syncope 11/23/2020  . Acute right-sided low back pain without sciatica 12/18/2018  . BPH (benign prostatic hyperplasia)   . Hypertension   . OBSTRUCTIVE SLEEP APNEA 07/29/2009  . HYPERLIPIDEMIA TYPE I / IV 04/14/2009  . DYSPNEA 04/14/2009  . TOE PAIN 09/03/2008  . INSOMNIA UNSPECIFIED 06/13/2008  . ACUTE BRONCHITIS 07/19/2007  . IRRITABLE BOWEL SYNDROME 07/19/2007  . HYPERTROPHY PROSTATE W/O UR OBST & OTH LUTS 07/05/2007  . Hyperlipemia 10/12/2006  . HYPERTENSION, BENIGN SYSTEMIC 10/12/2006  . Hyperplasia of prostate 10/12/2006    Evelene Croon, MPT 01/05/2021, 1:37 PM  [facility] [address] [place], [place], [address] Phone: [phone]   Fax:  [phone]  Name: John Evans MRN: [id number] Date of Birth: 05-Jun-1941

## 2021-01-12 ENCOUNTER — Other Ambulatory Visit: Payer: Self-pay | Admitting: Family Medicine

## 2021-01-12 ENCOUNTER — Other Ambulatory Visit (HOSPITAL_COMMUNITY): Payer: Self-pay

## 2021-01-12 MED ORDER — ALLOPURINOL 300 MG PO TABS
ORAL_TABLET | Freq: Every day | ORAL | 6 refills | Status: DC
  Filled 2021-01-12: qty 30, 30d supply, fill #0
  Filled 2021-02-08: qty 30, 30d supply, fill #1
  Filled 2021-03-15: qty 30, 30d supply, fill #2
  Filled 2021-04-13: qty 30, 30d supply, fill #3
  Filled 2021-04-14: qty 30, 30d supply, fill #0
  Filled 2021-05-13: qty 30, 30d supply, fill #1
  Filled 2021-06-14: qty 30, 30d supply, fill #2

## 2021-01-13 ENCOUNTER — Other Ambulatory Visit: Payer: Self-pay

## 2021-01-13 ENCOUNTER — Encounter: Payer: Self-pay | Admitting: Physical Therapy

## 2021-01-13 ENCOUNTER — Ambulatory Visit: Payer: Medicare Other | Admitting: Physical Therapy

## 2021-01-13 DIAGNOSIS — M25512 Pain in left shoulder: Secondary | ICD-10-CM | POA: Diagnosis not present

## 2021-01-13 DIAGNOSIS — R262 Difficulty in walking, not elsewhere classified: Secondary | ICD-10-CM

## 2021-01-13 DIAGNOSIS — G8929 Other chronic pain: Secondary | ICD-10-CM

## 2021-01-13 DIAGNOSIS — M25612 Stiffness of left shoulder, not elsewhere classified: Secondary | ICD-10-CM

## 2021-01-13 DIAGNOSIS — M6281 Muscle weakness (generalized): Secondary | ICD-10-CM | POA: Diagnosis not present

## 2021-01-13 NOTE — Therapy (Signed)
[place], [place], [address] Phone: [phone]   Fax:  [phone]  Physical Therapy Treatment  Patient Details  Name: John Evans MRN: [id number] Date of Birth: 1941/01/09 Referring Provider (PT): Joni Fears, MD   Encounter Date: 01/13/2021   PT End of Session - 01/13/21 0856    Visit Number 2    Number of Visits 14    Date for PT Re-Evaluation 03/05/21    Progress Note Due on Visit 10    PT Start Time 2409    PT Stop Time 0931    PT Time Calculation (min) 44 min    Activity Tolerance Patient tolerated treatment well    Behavior During Therapy [facility] for tasks assessed/performed           Past Medical History:  Diagnosis Date  . BPH (benign prostatic hyperplasia)   . Gout   . Hyperlipidemia    Patient reportedly had a coronary calcium score of 21 last year I cannot find his report  . Hypertension   . Insomnia   . Obstructive sleep apnea   . Panic attacks     Past Surgical History:  Procedure Laterality Date  . BACK SURGERY    . EYE SURGERY      There were no vitals filed for this visit.   Subjective Assessment - 01/13/21 0851    Subjective Pt arriving today reporting difficulty sleeping at night. 4/10 pain reported today upon arrival.    Pertinent History partial tearing of distal subscapularis, intra-articular portion of biceps long head without a tear. AC joint degerative changes and minor glenohumeral degenerative changes.    Limitations Walking;House hold activities;Other (comment);Lifting    Diagnostic tests partial tear of suprspinatus, 50% of tendon thickness,  partial tearing of distal subscapularis, intra-articular portion of biceps long head without a tear. AC joint degerative changes and minor glenohumeral degenerative changes.    Patient Stated Goals Stop hurting    Currently in Pain? Yes    Pain Score 4     Pain Location Shoulder    Pain Orientation Left    Pain Descriptors /  Indicators Aching;Sore    Pain Type Chronic pain    Pain Onset More than a month ago                             [facility] Adult PT Treatment/Exercise - 01/13/21 0001      Exercises   Exercises Shoulder      Shoulder Exercises: Standing   External Rotation Strengthening;Left;15 reps;Theraband   2 sets   Theraband Level (Shoulder External Rotation) Level 2 (Red)    Internal Rotation Left;Strengthening;15 reps;Theraband    Theraband Level (Shoulder Internal Rotation) Level 2 (Red)    Extension Strengthening;Both;15 reps;Other (comment)    Extension Limitations using 1# bar 2 sets    Row Strengthening;Both;15 reps;Theraband   2 sets   Theraband Level (Shoulder Row) Level 2 (Red)      Shoulder Exercises: Pulleys   Flexion 5 minutes      Shoulder Exercises: ROM/Strengthening   UBE (Upper Arm Bike) L2 x 6 minutes (3 forwar and 3 back)    Ranger seated circles both directions x 2 minutes    Ball on Wall red physioball x 10 bilateral UE's      Modalities   Modalities Cryotherapy      Cryotherapy   Number Minutes Cryotherapy 5 Minutes    Cryotherapy  Location Shoulder    Type of Cryotherapy Ice pack      Manual Therapy   Manual therapy comments grade 2-3 left shoulderGH AP mobs                    PT Short Term Goals - 01/13/21 0901      PT SHORT TERM GOAL #1   Title Pt will be independent in his initial HEP.    Status On-going      PT SHORT TERM GOAL #2   Title Pt will be able to perform 5 x sit to stand in </= 12 seconds with </= single UE support from standard height arm chair.    Status On-going      PT SHORT TERM GOAL #3   Status On-going             PT Long Term Goals - 01/05/21 1136      PT LONG TERM GOAL #1   Title Pt will be independent in his advance HEP for LE strengtheing and left shoulder ROM/strengthening.    Time 6    Period Weeks    Status New    Target Date 02/19/21      PT LONG TERM GOAL #2   Title Pt will be able to  increase his left shouder flexion/abd to ./= 160 degrees with pain </= 2/10.    Time 6    Period Weeks    Status New    Target Date 02/19/21      PT LONG TERM GOAL #3   Title Pt will improve his left shoulder strength to grossly 5/5 in order to improve functional mobility.    Baseline grossly -4/5 on 01/05/2021    Time 6    Period Weeks    Status New    Target Date 02/19/21      PT LONG TERM GOAL #4   Title Pt will improve his FOTO to >/= 64.    Baseline 41 on 01/05/2021    Time 6    Period Weeks    Status New    Target Date 02/19/21                 Plan - 01/13/21 0858    Clinical Impression Statement Pt tolerating shoulder exercises well progressing with stretching and ROM as well as strengthening. Pt requiring verbal cues for exercise techniques. Focus today was on pt's left shoulder. Continue skilled PT to progress toward maximum function.    Personal Factors and Comorbidities Comorbidity 3+    Comorbidities BPH, gout, hyperlipidemia, panic attacks, obstructive sleep apnea    Examination-Activity Limitations Lift;Dressing;Transfers;Squat;Stairs    Stability/Clinical Decision Making Stable/Uncomplicated    Rehab Potential Good    PT Duration 6 weeks    PT Treatment/Interventions ADLs/Self Care Home Management;Cryotherapy;Electrical Stimulation;Iontophoresis 4mg /ml Dexamethasone;Moist Heat;Ultrasound;Gait training;Stair training;Functional mobility training;Therapeutic activities;Therapeutic exercise;Balance training;Neuromuscular re-education;Patient/family education;Passive range of motion;Manual techniques;Dry needling;Taping    PT Next Visit Plan shouder ROM, shoulder mobilization, LE strengthening for quads and extensors, sit to stand    PT Home Exercise Plan Access Code: [email or web address]  URL: [email or web address]  Date: 01/05/2021  Prepared by: Kearney Hard    Exercises  Standing Hip Flexor Stretch - 3 x daily - 7 x weekly - 3 reps - 30 seconds hold  Supine  Shoulder Flexion Extension AAROM with Dowel - 3 x daily - 7 x weekly - 2 sets - 10 reps - 3-5 seconds hold  Supine Shoulder External Rotation in 45  Degrees Abduction AAROM with Dowel - 3 x daily - 7 x weekly - 2 sets - 10 reps - 5 seconds hold  Standing Bilateral Low Shoulder Row with Anchored Resistance - 3 x daily - 7 x weekly - 2 sets - 10 reps    Consulted and Agree with Plan of Care Patient           Patient will benefit from skilled therapeutic intervention in order to improve the following deficits and impairments:  Decreased strength,Decreased mobility,Decreased balance,Impaired flexibility,Decreased activity tolerance,Difficulty walking,Decreased range of motion,Pain,Impaired UE functional use  Visit Diagnosis: Chronic left shoulder pain  Stiffness of left shoulder, not elsewhere classified  Muscle weakness (generalized)  Difficulty in walking, not elsewhere classified     Problem List Patient Active Problem List   Diagnosis Date Noted  . 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/24/2020  . Pain in left hip 12/24/2020  . Labile hypertension 11/23/2020  . Abnormal heart sounds 11/23/2020  . Postural dizziness with near syncope 11/23/2020  . Acute right-sided low back pain without sciatica 12/18/2018  . BPH (benign prostatic hyperplasia)   . Hypertension   . OBSTRUCTIVE SLEEP APNEA 07/29/2009  . HYPERLIPIDEMIA TYPE I / IV 04/14/2009  . DYSPNEA 04/14/2009  . TOE PAIN 09/03/2008  . INSOMNIA UNSPECIFIED 06/13/2008  . ACUTE BRONCHITIS 07/19/2007  . IRRITABLE BOWEL SYNDROME 07/19/2007  . HYPERTROPHY PROSTATE W/O UR OBST & OTH LUTS 07/05/2007  . Hyperlipemia 10/12/2006  . HYPERTENSION, BENIGN SYSTEMIC 10/12/2006  . Hyperplasia of prostate 10/12/2006    Oretha Caprice, PT, MPT 01/13/2021, 9:26 AM  [facility] [address] [place], [place], [address] Phone: [phone]   Fax:  [phone]  Name: DMITRIY GAIR MRN:  [id number] Date of Birth: Jan 04, 1941

## 2021-01-15 ENCOUNTER — Ambulatory Visit (INDEPENDENT_AMBULATORY_CARE_PROVIDER_SITE_OTHER): Payer: Medicare Other | Admitting: Rehabilitative and Restorative Service Providers"

## 2021-01-15 ENCOUNTER — Encounter: Payer: Self-pay | Admitting: Rehabilitative and Restorative Service Providers"

## 2021-01-15 ENCOUNTER — Other Ambulatory Visit: Payer: Self-pay

## 2021-01-15 DIAGNOSIS — R262 Difficulty in walking, not elsewhere classified: Secondary | ICD-10-CM

## 2021-01-15 DIAGNOSIS — M6281 Muscle weakness (generalized): Secondary | ICD-10-CM | POA: Diagnosis not present

## 2021-01-15 DIAGNOSIS — G8929 Other chronic pain: Secondary | ICD-10-CM

## 2021-01-15 DIAGNOSIS — M25512 Pain in left shoulder: Secondary | ICD-10-CM

## 2021-01-15 DIAGNOSIS — M25612 Stiffness of left shoulder, not elsewhere classified: Secondary | ICD-10-CM | POA: Diagnosis not present

## 2021-01-15 NOTE — Therapy (Signed)
[place], [place], [address] Phone: [phone]   Fax:  [phone]  Physical Therapy Treatment  Patient Details  Name: John Evans MRN: [id number] Date of Birth: 03/10/1941 Referring Provider (PT): Joni Fears, MD   Encounter Date: 01/15/2021   PT End of Session - 01/15/21 1006    Visit Number 3    Number of Visits 14    Date for PT Re-Evaluation 03/05/21    PT Start Time 0934    PT Stop Time 1018    PT Time Calculation (min) 44 min    Activity Tolerance Patient tolerated treatment well;No increased pain    Behavior During Therapy WFL for tasks assessed/performed           Past Medical History:  Diagnosis Date  . BPH (benign prostatic hyperplasia)   . Gout   . Hyperlipidemia    Patient reportedly had a coronary calcium score of 21 last year I cannot find his report  . Hypertension   . Insomnia   . Obstructive sleep apnea   . Panic attacks     Past Surgical History:  Procedure Laterality Date  . BACK SURGERY    . EYE SURGERY      There were no vitals filed for this visit.   Subjective Assessment - 01/15/21 1029    Subjective I am doing good. Arm feels stiff    Currently in Pain? Yes    Pain Score 3     Pain Location Shoulder    Pain Orientation Left    Pain Descriptors / Indicators Aching;Sore    Multiple Pain Sites Yes    Pain Score 2    Pain Location Hip    Pain Orientation Left    Pain Descriptors / Indicators Tightness;Sore                             [facility] Adult PT Treatment/Exercise - 01/15/21 0001      Exercises   Exercises Knee/Hip      Knee/Hip Exercises: Standing   Other Standing Knee Exercises mini-squats at counter x 10 with glute set, standing hip flexor stretch L 2x30 sec      Knee/Hip Exercises: Supine   Other Supine Knee/Hip Exercises L thomas stretch 2x30 sec with PT gentle manual pressure to assist with stretch sensation, L VMO SLR x 15,  ball squeeze x 15, ball squeeze with bridge x 15      Shoulder Exercises: Supine   Other Supine Exercises RTB bil ER x 20, RTB chest pull x 15, L shoulder ext isometric x 20 with 2-3 sec hold      Shoulder Exercises: Seated   Other Seated Exercises NuStep bil UE/LE level 5 x 5 min with PT obtaining further subjective info      Shoulder Exercises: Standing   Other Standing Exercises wall ladder flex x 10, corner stretch 3x30 sec, wall ladder flex/abdct x 5 each at tx end                    PT Short Term Goals - 01/13/21 0901      PT SHORT TERM GOAL #1   Title Pt will be independent in his initial HEP.    Status On-going      PT SHORT TERM GOAL #2   Title Pt will be able to perform 5 x sit to stand in </= 12 seconds with </= single  UE support from standard height arm chair.    Status On-going      PT SHORT TERM GOAL #3   Status On-going             PT Long Term Goals - 01/05/21 1136      PT LONG TERM GOAL #1   Title Pt will be independent in his advance HEP for LE strengtheing and left shoulder ROM/strengthening.    Time 6    Period Weeks    Status New    Target Date 02/19/21      PT LONG TERM GOAL #2   Title Pt will be able to increase his left shouder flexion/abd to ./= 160 degrees with pain </= 2/10.    Time 6    Period Weeks    Status New    Target Date 02/19/21      PT LONG TERM GOAL #3   Title Pt will improve his left shoulder strength to grossly 5/5 in order to improve functional mobility.    Baseline grossly -4/5 on 01/05/2021    Time 6    Period Weeks    Status New    Target Date 02/19/21      PT LONG TERM GOAL #4   Title Pt will improve his FOTO to >/= 64.    Baseline 41 on 01/05/2021    Time 6    Period Weeks    Status New    Target Date 02/19/21                 Plan - 01/15/21 1019    Clinical Impression Statement Pt did very well with his session today. At end of session, L shoulder flex measured 146 degrees in standing. He  needed tactile and verbal cues for proper muscle facilitation for L shoulder strengthening and verbal cues to decrease Upper Trap substitution with all overhead movements. Pt would benefit from further PT for L shoulder ROM and strengthening and LE strengthening to improve functional mobility. No pain at tx end. Pt had improvement in L shoulder AROM from beginning to end of treatment.    PT Treatment/Interventions ADLs/Self Care Home Management;Cryotherapy;Electrical Stimulation;Iontophoresis 4mg /ml Dexamethasone;Moist Heat;Ultrasound;Gait training;Stair training;Functional mobility training;Therapeutic activities;Therapeutic exercise;Balance training;Neuromuscular re-education;Patient/family education;Passive range of motion;Manual techniques;Dry needling;Taping    PT Next Visit Plan shouder ROM, shoulder mobilization, LE strengthening for quads and extensors, sit to stand    Consulted and Agree with Plan of Care Patient           Patient will benefit from skilled therapeutic intervention in order to improve the following deficits and impairments:  Decreased strength,Decreased mobility,Decreased balance,Impaired flexibility,Decreased activity tolerance,Difficulty walking,Decreased range of motion,Pain,Impaired UE functional use  Visit Diagnosis: Chronic left shoulder pain  Stiffness of left shoulder, not elsewhere classified  Muscle weakness (generalized)  Difficulty in walking, not elsewhere classified     Problem List Patient Active Problem List   Diagnosis Date Noted  . 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/24/2020  . Pain in left hip 12/24/2020  . Labile hypertension 11/23/2020  . Abnormal heart sounds 11/23/2020  . Postural dizziness with near syncope 11/23/2020  . Acute right-sided low back pain without sciatica 12/18/2018  . BPH (benign prostatic hyperplasia)   . Hypertension   . OBSTRUCTIVE SLEEP APNEA 07/29/2009  . HYPERLIPIDEMIA TYPE I / IV 04/14/2009  . DYSPNEA  04/14/2009  . TOE PAIN 09/03/2008  . INSOMNIA UNSPECIFIED 06/13/2008  . ACUTE BRONCHITIS 07/19/2007  . IRRITABLE BOWEL SYNDROME 07/19/2007  .  HYPERTROPHY PROSTATE W/O UR OBST & OTH LUTS 07/05/2007  . Hyperlipemia 10/12/2006  . HYPERTENSION, BENIGN SYSTEMIC 10/12/2006  . Hyperplasia of prostate 10/12/2006    America Brown, PT, DPT 01/15/2021, 11:56 AM  [facility] [address] [place], [place], [address] Phone: [phone]   Fax:  [phone]  Name: John Evans MRN: [id number] Date of Birth: [birth year]

## 2021-01-20 ENCOUNTER — Ambulatory Visit: Payer: Medicare Other | Admitting: Physical Therapy

## 2021-01-20 ENCOUNTER — Encounter: Payer: Self-pay | Admitting: Physical Therapy

## 2021-01-20 ENCOUNTER — Other Ambulatory Visit: Payer: Self-pay

## 2021-01-20 DIAGNOSIS — M25612 Stiffness of left shoulder, not elsewhere classified: Secondary | ICD-10-CM

## 2021-01-20 DIAGNOSIS — G8929 Other chronic pain: Secondary | ICD-10-CM

## 2021-01-20 DIAGNOSIS — R262 Difficulty in walking, not elsewhere classified: Secondary | ICD-10-CM

## 2021-01-20 DIAGNOSIS — M6281 Muscle weakness (generalized): Secondary | ICD-10-CM

## 2021-01-20 DIAGNOSIS — M25512 Pain in left shoulder: Secondary | ICD-10-CM | POA: Diagnosis not present

## 2021-01-20 NOTE — Therapy (Signed)
[place], [place], [address] Phone: [phone]   Fax:  [phone]  Physical Therapy Treatment Progress Note  Patient Details  Name: John Evans MRN: [id number] Date of Birth: 11-03-1940 Referring Provider (PT): Joni Fears, MD   Encounter Date: 01/20/2021   PT End of Session - 01/20/21 0938    Visit Number 4    Number of Visits 14    Date for PT Re-Evaluation 03/05/21    Authorization Type Progress note sent on 01/20/2021 at 4th visit and routed to Dr. Durward Fortes    Progress Note Due on Visit 14    PT Start Time 0932    PT Stop Time 1012    PT Time Calculation (min) 40 min    Activity Tolerance Patient tolerated treatment well;No increased pain    Behavior During Therapy WFL for tasks assessed/performed           Past Medical History:  Diagnosis Date  . BPH (benign prostatic hyperplasia)   . Gout   . Hyperlipidemia    Patient reportedly had a coronary calcium score of 21 last year I cannot find his report  . Hypertension   . Insomnia   . Obstructive sleep apnea   . Panic attacks     Past Surgical History:  Procedure Laterality Date  . BACK SURGERY    . EYE SURGERY      There were no vitals filed for this visit.   Subjective Assessment - 01/20/21 0936    Subjective Pt reprorting his shoulder can be painful at times when waking up. No pain at present when arriving to clinic.    Pertinent History partial tearing of distal subscapularis, intra-articular portion of biceps long head without a tear. AC joint degerative changes and minor glenohumeral degenerative changes.    Limitations Walking;House hold activities;Other (comment);Lifting    Diagnostic tests partial tear of suprspinatus, 50% of tendon thickness,  partial tearing of distal subscapularis, intra-articular portion of biceps long head without a tear. AC joint degerative changes and minor glenohumeral degenerative changes.    Patient  Stated Goals Stop hurting    Currently in Pain? No/denies              [facility] PT Assessment - 01/20/21 0001      Assessment   Medical Diagnosis left shoulder pain M25.512    Referring Provider (PT) Joni Fears, MD    Hand Dominance Right    Prior Therapy yes, years ago after back surgery      Precautions   Precautions None      Restrictions   Weight Bearing Restrictions No      Balance Screen   Has the patient fallen in the past 6 months No    Is the patient reluctant to leave their home because of a fear of falling?  No      AROM   Overall AROM Comments all in supine    Right/Left Shoulder Right;Left    Right Shoulder Extension 40 Degrees    Right Shoulder Flexion 160 Degrees    Right Shoulder ABduction 164 Degrees    Right Shoulder Internal Rotation 60 Degrees   shoulder at 45 abducted   Right Shoulder External Rotation 70 Degrees   shoulder 45 degrees abducted   Left Shoulder Extension 40 Degrees    Left Shoulder Flexion 152 Degrees    Left Shoulder ABduction 160 Degrees    Left Shoulder Internal Rotation 60 Degrees   shoulder abd 45  degrees   Left Shoulder External Rotation 55 Degrees   shoulder abd 45 degrees     Strength   Left Shoulder Flexion 4+/5    Left Shoulder Extension 4+/5    Left Shoulder ABduction 4+/5    Left Shoulder Internal Rotation 4+/5    Left Shoulder External Rotation 4+/5      Palpation   Spinal mobility 4    Palpation comment mild tenderness over left supraspinatus                         [facility] Adult PT Treatment/Exercise - 01/20/21 0001      Exercises   Exercises Knee/Hip;Shoulder      Knee/Hip Exercises: Stretches   Hip Flexor Stretch Both;2 reps;30 seconds    Gastroc Stretch Both;30 seconds;2 reps    Gastroc Stretch Limitations slant board      Knee/Hip Exercises: Aerobic   Recumbent Bike bike/UBE x 6 minutes L5      Knee/Hip Exercises: Seated   Sit to Sand 10 reps;without UE support      Knee/Hip  Exercises: Supine   Bridges with Diona Foley Squeeze Strengthening;Both;15 reps    Straight Leg Raise with External Rotation Strengthening;15 reps                    PT Short Term Goals - 01/20/21 0959      PT SHORT TERM GOAL #1   Title Pt will be independent in his initial HEP.    Status Achieved    Target Date 02/05/21      PT SHORT TERM GOAL #2   Title Pt will be able to perform 5 x sit to stand in </= 12 seconds with </= single UE support from standard height arm chair.    Baseline 14 seconds with no UE support on 01/20/2021    Status On-going             PT Long Term Goals - 01/20/21 1003      PT LONG TERM GOAL #1   Title Pt will be independent in his advance HEP for LE strengtheing and left shoulder ROM/strengthening.    Status On-going      PT LONG TERM GOAL #2   Title Pt will be able to increase his left shouder flexion/abd to ./= 160 degrees with pain </= 2/10.    Baseline 152 on 01/20/2021    Period Weeks    Status On-going      PT LONG TERM GOAL #3   Title Pt will improve his left shoulder strength to grossly 5/5 in order to improve functional mobility.    Baseline grossly 4+/5 on 01/20/2021    Time 6    Period Weeks    Status On-going      PT LONG TERM GOAL #4   Title Pt will improve his FOTO to >/= 64.    Status On-going                 Plan - 01/20/21 7846    Clinical Impression Statement Pt arriving today reporting he has an appointment with Dr. Durward Fortes tomorrow. Pt still reporting shoulder stiffness when he first wakes up in the morning.Pt has improved his left shoulder and right shoulder AROM since starting therapy (see flowsheets). Pt also with LE strength improved to 4+/5.  Pt reporting his knee is "getting better". Pt tolerating exercises well progresing with shoulder ROM and LE strengthening.  Recommending more balance work  at upcoming visits and funcitonal mobility including stair navigation. Continue skilled PT to maximize funcitonal  mobility.    Personal Factors and Comorbidities Comorbidity 3+    Comorbidities BPH, gout, hyperlipidemia, panic attacks, obstructive sleep apnea    Examination-Activity Limitations Lift;Dressing;Transfers;Squat;Stairs    Examination-Participation Restrictions Other;Yard Work    Stability/Clinical Decision Making Stable/Uncomplicated    Rehab Potential Good    PT Frequency 2x / week    PT Duration 6 weeks    PT Treatment/Interventions ADLs/Self Care Home Management;Cryotherapy;Electrical Stimulation;Iontophoresis 4mg /ml Dexamethasone;Moist Heat;Ultrasound;Gait training;Stair training;Functional mobility training;Therapeutic activities;Therapeutic exercise;Balance training;Neuromuscular re-education;Patient/family education;Passive range of motion;Manual techniques;Dry needling;Taping    PT Next Visit Plan shouder ROM, shoulder mobilization, LE strengthening for quads and extensors, sit to stand    PT Home Exercise Plan Access Code: [email or web address]  URL: [email or web address]  Date: 01/05/2021  Prepared by: Kearney Hard    Exercises  Standing Hip Flexor Stretch - 3 x daily - 7 x weekly - 3 reps - 30 seconds hold  Supine Shoulder Flexion Extension AAROM with Dowel - 3 x daily - 7 x weekly - 2 sets - 10 reps - 3-5 seconds hold  Supine Shoulder External Rotation in 45 Degrees Abduction AAROM with Dowel - 3 x daily - 7 x weekly - 2 sets - 10 reps - 5 seconds hold  Standing Bilateral Low Shoulder Row with Anchored Resistance - 3 x daily - 7 x weekly - 2 sets - 10 reps    Consulted and Agree with Plan of Care Patient           Patient will benefit from skilled therapeutic intervention in order to improve the following deficits and impairments:  Decreased strength,Decreased mobility,Decreased balance,Impaired flexibility,Decreased activity tolerance,Difficulty walking,Decreased range of motion,Pain,Impaired UE functional use  Visit Diagnosis: Chronic left shoulder pain  Stiffness of left  shoulder, not elsewhere classified  Muscle weakness (generalized)  Difficulty in walking, not elsewhere classified     Problem List Patient Active Problem List   Diagnosis Date Noted  . 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/24/2020  . Pain in left hip 12/24/2020  . Labile hypertension 11/23/2020  . Abnormal heart sounds 11/23/2020  . Postural dizziness with near syncope 11/23/2020  . Acute right-sided low back pain without sciatica 12/18/2018  . BPH (benign prostatic hyperplasia)   . Hypertension   . OBSTRUCTIVE SLEEP APNEA 07/29/2009  . HYPERLIPIDEMIA TYPE I / IV 04/14/2009  . DYSPNEA 04/14/2009  . TOE PAIN 09/03/2008  . INSOMNIA UNSPECIFIED 06/13/2008  . ACUTE BRONCHITIS 07/19/2007  . IRRITABLE BOWEL SYNDROME 07/19/2007  . HYPERTROPHY PROSTATE W/O UR OBST & OTH LUTS 07/05/2007  . Hyperlipemia 10/12/2006  . HYPERTENSION, BENIGN SYSTEMIC 10/12/2006  . Hyperplasia of prostate 10/12/2006    Oretha Caprice, PT, MPT 01/20/2021, 10:11 AM  [facility] [address] [place] [employer], [place], [address] Phone: [phone]   Fax:  [phone]  Name: John Evans MRN: [id number] Date of Birth: 1941/07/05

## 2021-01-21 ENCOUNTER — Ambulatory Visit (INDEPENDENT_AMBULATORY_CARE_PROVIDER_SITE_OTHER): Payer: Medicare Other | Admitting: Physical Therapy

## 2021-01-21 ENCOUNTER — Encounter: Payer: Self-pay | Admitting: Orthopaedic Surgery

## 2021-01-21 ENCOUNTER — Encounter: Payer: Self-pay | Admitting: Physical Therapy

## 2021-01-21 ENCOUNTER — Ambulatory Visit: Payer: Medicare Other | Admitting: Orthopaedic Surgery

## 2021-01-21 DIAGNOSIS — R262 Difficulty in walking, not elsewhere classified: Secondary | ICD-10-CM | POA: Diagnosis not present

## 2021-01-21 DIAGNOSIS — M25512 Pain in left shoulder: Secondary | ICD-10-CM

## 2021-01-21 DIAGNOSIS — G8929 Other chronic pain: Secondary | ICD-10-CM | POA: Diagnosis not present

## 2021-01-21 DIAGNOSIS — M7502 Adhesive capsulitis of left shoulder: Secondary | ICD-10-CM | POA: Diagnosis not present

## 2021-01-21 DIAGNOSIS — M6281 Muscle weakness (generalized): Secondary | ICD-10-CM

## 2021-01-21 DIAGNOSIS — M25612 Stiffness of left shoulder, not elsewhere classified: Secondary | ICD-10-CM

## 2021-01-21 NOTE — Progress Notes (Signed)
Office Visit Note   Patient: John Evans           Date of Birth: 22-Dec-1940           MRN: [id number] Visit Date: 01/21/2021              Requested by: Susy Frizzle, MD [id number] [place],  [place] [address] PCP: Susy Frizzle, MD   Assessment & Plan: Visit Diagnoses:  1. Adhesive capsulitis of left shoulder     Plan: Excellent progress with physical therapy with considerable decrease in the left shoulder discomfort and increase in motion.  Could place the arm just about fully overhead.  A little loss of external rotation but not functionally limiting.  No need to consider manipulation at this point.  MRI scan did reveal some partial tearing of the cuff but I think it is good to be fine with time and exercises.  We will plan to see him back as needed  Follow-Up Instructions: Return if symptoms worsen or fail to improve.   Orders:  No orders of the defined types were placed in this encounter.  No orders of the defined types were placed in this encounter.     Procedures: No procedures performed   Clinical Data: No additional findings.   Subjective: Chief Complaint  Patient presents with   Left Shoulder - Follow-up    Doing better, still sore, PT is helping   Left Hip - Follow-up    Doing better, still sore, PT is helping  Definitely improved since starting physical therapy with better motion.  No numbness or tingling  HPI  Review of Systems   Objective: Vital Signs: There were no vitals taken for this visit.  Physical Exam Constitutional:      Appearance: He is well-developed.  Eyes:     Pupils: Pupils are equal, round, and reactive to light.  Pulmonary:     Effort: Pulmonary effort is normal.  Skin:    General: Skin is warm and dry.  Neurological:     Mental Status: He is alert and oriented to person, place, and time.  Psychiatric:        Behavior: Behavior normal.    Ortho Exam awake alert and oriented x3.  Comfortable  sitting in.  Able to place left arm fully overhead with no impingement or empty can testing.  Negative Speed sign.  Had minimal loss of overhead motion which hopefully will resolve with continued therapy.  Also able to touch the middle of his back with his hand  Specialty Comments:  No specialty comments available.  Imaging: No results found.   PMFS History: Patient Active Problem List   Diagnosis Date Noted   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/24/2020   Pain in left hip 12/24/2020   Labile hypertension 11/23/2020   Abnormal heart sounds 11/23/2020   Postural dizziness with near syncope 11/23/2020   Acute right-sided low back pain without sciatica 12/18/2018   BPH (benign prostatic hyperplasia)    Hypertension    OBSTRUCTIVE SLEEP APNEA 07/29/2009   HYPERLIPIDEMIA TYPE I / IV 04/14/2009   DYSPNEA 04/14/2009   TOE PAIN 09/03/2008   INSOMNIA UNSPECIFIED 06/13/2008   ACUTE BRONCHITIS 07/19/2007   IRRITABLE BOWEL SYNDROME 07/19/2007   HYPERTROPHY PROSTATE W/O UR OBST & OTH LUTS 07/05/2007   Hyperlipemia 10/12/2006   HYPERTENSION, BENIGN SYSTEMIC 10/12/2006   Hyperplasia of prostate 10/12/2006   Past Medical History:  Diagnosis Date   BPH (benign  prostatic hyperplasia)    Gout    Hyperlipidemia    Patient reportedly had a coronary calcium score of 21 last year I cannot find his report   Hypertension    Insomnia    Obstructive sleep apnea    Panic attacks     Family History  Problem Relation Age of Onset   Cancer Father        throat   Esophageal cancer Father    Diabetes Paternal Grandmother    Colon cancer Neg Hx     Past Surgical History:  Procedure Laterality Date   BACK SURGERY     EYE SURGERY     Social History   Occupational History   Occupation: 2  Tobacco Use   Smoking status: Never   Smokeless tobacco: Never  Substance and Sexual Activity   Alcohol use: No   Drug use: No   Sexual activity: Yes    Comment: married, retired pharmacist      Garald Balding, MD   Note - This record has been created using [employer].  Chart creation errors have been sought, but may not always  have been located. Such creation errors do not reflect on  the standard of medical care.

## 2021-01-21 NOTE — Therapy (Signed)
[place], [place], [address] Phone: [phone]   Fax:  [phone]  Physical Therapy Treatment  Patient Details  Name: John Evans MRN: [id number] Date of Birth: 11/18/1940 Referring Provider (PT): Joni Fears, MD   Encounter Date: 01/21/2021   PT End of Session - 01/21/21 1511     Visit Number 5    Number of Visits 14    Date for PT Re-Evaluation 03/05/21    Authorization Type Progress note sent on 01/20/2021 at 4th visit and routed to Dr. Durward Fortes    Progress Note Due on Visit 14    PT Start Time 1430    PT Stop Time 1509    PT Time Calculation (min) 39 min    Activity Tolerance Patient tolerated treatment well;No increased pain    Behavior During Therapy WFL for tasks assessed/performed             Past Medical History:  Diagnosis Date   BPH (benign prostatic hyperplasia)    Gout    Hyperlipidemia    Patient reportedly had a coronary calcium score of 21 last year I cannot find his report   Hypertension    Insomnia    Obstructive sleep apnea    Panic attacks     Past Surgical History:  Procedure Laterality Date   BACK SURGERY     EYE SURGERY      There were no vitals filed for this visit.   Subjective Assessment - 01/21/21 1433     Subjective Still feels like he is having trouble with legs getting up/down from seated position.  Reports "very little" pain    Pertinent History partial tearing of distal subscapularis, intra-articular portion of biceps long head without a tear. AC joint degerative changes and minor glenohumeral degenerative changes.    Limitations Walking;House hold activities;Other (comment);Lifting    Diagnostic tests partial tear of suprspinatus, 50% of tendon thickness,  partial tearing of distal subscapularis, intra-articular portion of biceps long head without a tear. AC joint degerative changes and minor glenohumeral degenerative changes.    Patient Stated Goals Stop  hurting    Currently in Pain? Yes    Pain Score 3     Pain Location Shoulder    Pain Orientation Left    Pain Descriptors / Indicators Aching;Sore    Pain Type Chronic pain    Pain Onset More than a month ago    Pain Frequency Constant    Aggravating Factors  lying on Lt side, reaching, lifting    Pain Relieving Factors ibprofen                               [facility] Adult PT Treatment/Exercise - 01/21/21 1433       Knee/Hip Exercises: Stretches   Gastroc Stretch Both;30 seconds;3 reps    Gastroc Stretch Limitations slant board      Knee/Hip Exercises: Aerobic   Recumbent Bike bike/UBE x 6 minutes L6 (arms/legs)      Knee/Hip Exercises: Standing   Forward Step Up Both;20 reps;Hand Hold: 0;Step Height: 4"      Knee/Hip Exercises: Seated   Other Seated Knee/Hip Exercises seated SLR x 10 reps bil    Sit to Sand 10 reps;without UE support      Knee/Hip Exercises: Supine   Bridges with [employer] Strengthening;Both;20 reps      Shoulder Exercises: Supine   Horizontal ABduction Both;20 reps;Theraband  Theraband Level (Shoulder Horizontal ABduction) Level 3 (Green)    External Rotation Both;20 reps;Theraband    Theraband Level (Shoulder External Rotation) Level 3 (Green)      Shoulder Exercises: Standing   External Rotation Strengthening;Left;20 reps;Theraband    Theraband Level (Shoulder External Rotation) Level 3 (Green)    Internal Rotation Left;Strengthening;20 reps;Theraband    Theraband Level (Shoulder Internal Rotation) Level 3 (Green)    Row Strengthening;Both;20 reps;Theraband    Theraband Level (Shoulder Row) Level 3 Nyoka Cowden)                      PT Short Term Goals - 01/20/21 0959       PT SHORT TERM GOAL #1   Title Pt will be independent in his initial HEP.    Status Achieved    Target Date 02/05/21      PT SHORT TERM GOAL #2   Title Pt will be able to perform 5 x sit to stand in </= 12 seconds with </= single UE support  from standard height arm chair.    Baseline 14 seconds with no UE support on 01/20/2021    Status On-going               PT Long Term Goals - 01/20/21 1003       PT LONG TERM GOAL #1   Title Pt will be independent in his advance HEP for LE strengtheing and left shoulder ROM/strengthening.    Status On-going      PT LONG TERM GOAL #2   Title Pt will be able to increase his left shouder flexion/abd to ./= 160 degrees with pain </= 2/10.    Baseline 152 on 01/20/2021    Period Weeks    Status On-going      PT LONG TERM GOAL #3   Title Pt will improve his left shoulder strength to grossly 5/5 in order to improve functional mobility.    Baseline grossly 4+/5 on 01/20/2021    Time 6    Period Weeks    Status On-going      PT LONG TERM GOAL #4   Title Pt will improve his FOTO to >/= 64.    Status On-going                   Plan - 01/21/21 1512     Clinical Impression Statement Pt tolerated session well today without significant reports of pain.  Overall MD pleased with progress and recommended he continue with PT.  Will continue to benefit from PT to maximize function.    Personal Factors and Comorbidities Comorbidity 3+    Comorbidities BPH, gout, hyperlipidemia, panic attacks, obstructive sleep apnea    Examination-Activity Limitations Lift;Dressing;Transfers;Squat;Stairs    Examination-Participation Restrictions Other;Yard Work    Stability/Clinical Decision Making Stable/Uncomplicated    Rehab Potential Good    PT Frequency 2x / week    PT Duration 6 weeks    PT Treatment/Interventions ADLs/Self Care Home Management;Cryotherapy;Electrical Stimulation;Iontophoresis 4mg /ml Dexamethasone;Moist Heat;Ultrasound;Gait training;Stair training;Functional mobility training;Therapeutic activities;Therapeutic exercise;Balance training;Neuromuscular re-education;Patient/family education;Passive range of motion;Manual techniques;Dry needling;Taping    PT Next Visit Plan shouder  ROM, shoulder mobilization, LE strengthening for quads and extensors, sit to stand; work on shoulder strengthening    PT Home Exercise Plan Access Code: [email or web address]    Consulted and Agree with Plan of Care Patient             Patient will benefit from skilled therapeutic intervention in order  to improve the following deficits and impairments:  Decreased strength, Decreased mobility, Decreased balance, Impaired flexibility, Decreased activity tolerance, Difficulty walking, Decreased range of motion, Pain, Impaired UE functional use  Visit Diagnosis: Chronic left shoulder pain  Muscle weakness (generalized)  Difficulty in walking, not elsewhere classified  Stiffness of left shoulder, not elsewhere classified     Problem List Patient Active Problem List   Diagnosis Date Noted   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/24/2020   Pain in left hip 12/24/2020   Labile hypertension 11/23/2020   Abnormal heart sounds 11/23/2020   Postural dizziness with near syncope 11/23/2020   Acute right-sided low back pain without sciatica 12/18/2018   BPH (benign prostatic hyperplasia)    Hypertension    OBSTRUCTIVE SLEEP APNEA 07/29/2009   HYPERLIPIDEMIA TYPE I / IV 04/14/2009   DYSPNEA 04/14/2009   TOE PAIN 09/03/2008   INSOMNIA UNSPECIFIED 06/13/2008   ACUTE BRONCHITIS 07/19/2007   IRRITABLE BOWEL SYNDROME 07/19/2007   HYPERTROPHY PROSTATE W/O UR OBST & OTH LUTS 07/05/2007   Hyperlipemia 10/12/2006   HYPERTENSION, BENIGN SYSTEMIC 10/12/2006   Hyperplasia of prostate 10/12/2006      Laureen Abrahams, PT, DPT 01/21/21 3:13 PM     [facility] [address] [place], [place], [address] Phone: [phone]   Fax:  [phone]  Name: BARNETT ELZEY MRN: [id number] Date of Birth: July 10, 1941

## 2021-01-25 ENCOUNTER — Encounter: Payer: Medicare Other | Admitting: Physical Therapy

## 2021-01-26 ENCOUNTER — Ambulatory Visit: Payer: Medicare Other | Admitting: Physical Therapy

## 2021-01-26 ENCOUNTER — Other Ambulatory Visit: Payer: Self-pay

## 2021-01-26 ENCOUNTER — Encounter: Payer: Self-pay | Admitting: Physical Therapy

## 2021-01-26 DIAGNOSIS — R262 Difficulty in walking, not elsewhere classified: Secondary | ICD-10-CM

## 2021-01-26 DIAGNOSIS — G8929 Other chronic pain: Secondary | ICD-10-CM

## 2021-01-26 DIAGNOSIS — M6281 Muscle weakness (generalized): Secondary | ICD-10-CM

## 2021-01-26 DIAGNOSIS — M25512 Pain in left shoulder: Secondary | ICD-10-CM | POA: Diagnosis not present

## 2021-01-26 DIAGNOSIS — M25612 Stiffness of left shoulder, not elsewhere classified: Secondary | ICD-10-CM

## 2021-01-26 NOTE — Patient Instructions (Signed)
Access Code: [email or web address] URL: [email or web address] Date: 01/26/2021 Prepared by: Kearney Hard  Exercises Standing Hip Flexor Stretch - 3 x daily - 7 x weekly - 3 reps - 30 seconds hold Supine Shoulder Flexion Extension AAROM with Dowel - 3 x daily - 7 x weekly - 2 sets - 10 reps - 3-5 seconds hold Supine Shoulder External Rotation in 45 Degrees Abduction AAROM with Dowel - 3 x daily - 7 x weekly - 2 sets - 10 reps - 5 seconds hold Standing Bilateral Low Shoulder Row with Anchored Resistance - 3 x daily - 7 x weekly - 2 sets - 10 reps Supine Piriformis Stretch with Foot on Ground - 2 x daily - 7 x weekly - 3 reps - 30 seconds hold Supine Figure 4 Piriformis Stretch - 2 x daily - 7 x weekly - 3 reps - 30 seconds hold Supine Lower Trunk Rotation - 2 x daily - 7 x weekly - 3 sets - 3 reps - 30 seconds hold

## 2021-01-26 NOTE — Therapy (Signed)
[place], [place], [address] Phone: [phone]   Fax:  [phone]  Physical Therapy Treatment  Patient Details  Name: John Evans MRN: [id number] Date of Birth: Oct 22, 1940 Referring Provider (PT): Joni Fears, MD   Encounter Date: 01/26/2021   PT End of Session - 01/26/21 0820     Visit Number 6    Number of Visits 14    Date for PT Re-Evaluation 03/05/21    Authorization Type Progress note sent on 01/20/2021 at 4th visit and routed to Dr. Durward Fortes    Progress Note Due on Visit 14    PT Start Time 0800    PT Stop Time 0840    PT Time Calculation (min) 40 min    Activity Tolerance Patient tolerated treatment well;No increased pain    Behavior During Therapy WFL for tasks assessed/performed             Past Medical History:  Diagnosis Date   BPH (benign prostatic hyperplasia)    Gout    Hyperlipidemia    Patient reportedly had a coronary calcium score of 21 last year I cannot find his report   Hypertension    Insomnia    Obstructive sleep apnea    Panic attacks     Past Surgical History:  Procedure Laterality Date   BACK SURGERY     EYE SURGERY      There were no vitals filed for this visit.   Subjective Assessment - 01/26/21 0806     Subjective Pt reporting 3/10 pain in left shoulder today.    Pertinent History partial tearing of distal subscapularis, intra-articular portion of biceps long head without a tear. AC joint degerative changes and minor glenohumeral degenerative changes.    Diagnostic tests partial tear of suprspinatus, 50% of tendon thickness,  partial tearing of distal subscapularis, intra-articular portion of biceps long head without a tear. AC joint degerative changes and minor glenohumeral degenerative changes.    Patient Stated Goals Stop hurting    Currently in Pain? Yes    Pain Score 3     Pain Location Shoulder    Pain Orientation Left    Pain Descriptors /  Indicators Aching;Sore    Pain Type Chronic pain    Pain Onset More than a month ago    Multiple Pain Sites Yes    Pain Score 5    Pain Location Hip    Pain Orientation Left    Pain Descriptors / Indicators Aching    Pain Type Chronic pain    Pain Onset More than a month ago    Pain Frequency Intermittent                [facility] PT Assessment - 01/26/21 0001       Assessment   Medical Diagnosis left shoulder pain M25.512    Referring Provider (PT) Joni Fears, MD    Hand Dominance Right      Precautions   Precautions None      Restrictions   Weight Bearing Restrictions No      Balance Screen   Has the patient fallen in the past 6 months No    Is the patient reluctant to leave their home because of a fear of falling?  No      AROM   Left Shoulder Flexion 160 Degrees    Left Shoulder ABduction 160 Degrees    Left Shoulder Internal Rotation 80 Degrees    Left Shoulder External  Rotation 65 Degrees                           [facility] Adult PT Treatment/Exercise - 01/26/21 0001       Exercises   Exercises Knee/Hip;Shoulder      Knee/Hip Exercises: Stretches   Hip Flexor Stretch Both;2 reps;30 seconds    Hip Flexor Stretch Limitations lunge position standing    Piriformis Stretch Both;2 reps;20 seconds    Piriformis Stretch Limitations pulling knee toward opposite shoulder and then pushing knee away    Gastroc Stretch Both;30 seconds;3 reps    Gastroc Stretch Limitations foot on towel      Knee/Hip Exercises: Aerobic   Recumbent Bike bike/UBE x 6 minutes L6 (arms/legs)      Knee/Hip Exercises: Standing   Forward Step Up Both;20 reps;Hand Hold: 1;Step Height: 6"      Knee/Hip Exercises: Seated   Other Seated Knee/Hip Exercises seated SLR x 10 reps bil    Sit to Sand 10 reps;without UE support      Knee/Hip Exercises: Supine   Bridges with Diona Foley Squeeze Strengthening;Both;20 reps    Straight Leg Raise with External Rotation  Strengthening;Both;15 reps      Shoulder Exercises: Standing   External Rotation Strengthening;Left;20 reps;Theraband    Theraband Level (Shoulder External Rotation) Level 3 (Green)    Internal Rotation Left;Strengthening;20 reps;Theraband    Theraband Level (Shoulder Internal Rotation) Level 3 (Green)    Row Strengthening;Both;20 reps;Theraband    Theraband Level (Shoulder Row) Level 3 (Green)                      PT Short Term Goals - 01/26/21 0825       PT SHORT TERM GOAL #1   Title Pt will be independent in his initial HEP.      PT SHORT TERM GOAL #2   Title Pt will be able to perform 5 x sit to stand in </= 12 seconds with </= single UE support from standard height arm chair.    Baseline 14 seconds with no UE support on 01/20/2021    Status On-going               PT Long Term Goals - 01/26/21 0826       PT LONG TERM GOAL #1   Title Pt will be independent in his advance HEP for LE strengtheing and left shoulder ROM/strengthening.    Status On-going      PT LONG TERM GOAL #2   Title Pt will be able to increase his left shouder flexion/abd to ./= 160 degrees with pain </= 2/10.    Baseline 152 on 01/20/2021    Status On-going      PT LONG TERM GOAL #3   Title Pt will improve his left shoulder strength to grossly 5/5 in order to improve functional mobility.    Status On-going      PT LONG TERM GOAL #4   Title Pt will improve his FOTO to >/= 64.    Status On-going                   Plan - 01/26/21 2440     Clinical Impression Statement Pt tolerating exercises well today, no reports of increased pain during session.Left shoulder AROM measured today with flexion: 160 degrees, Abd: 160 degrees, ER 65 degrees and IR 80 degrees. Pt is continuing to make progress. Continue skilled PT progressing  toward LTG's set.    Comorbidities BPH, gout, hyperlipidemia, panic attacks, obstructive sleep apnea    Examination-Activity Limitations  Lift;Dressing;Transfers;Squat;Stairs    Examination-Participation Restrictions Other;Yard Work    Stability/Clinical Decision Making Stable/Uncomplicated    Rehab Potential Good    PT Frequency 2x / week    PT Duration 6 weeks    PT Treatment/Interventions ADLs/Self Care Home Management;Cryotherapy;Electrical Stimulation;Iontophoresis 4mg /ml Dexamethasone;Moist Heat;Ultrasound;Gait training;Stair training;Functional mobility training;Therapeutic activities;Therapeutic exercise;Balance training;Neuromuscular re-education;Patient/family education;Passive range of motion;Manual techniques;Dry needling;Taping    PT Next Visit Plan shouder ROM, shoulder mobilization, LE strengthening for quads and extensors, sit to stand; work on shoulder strengthening    PT Home Exercise Plan Access Code: [email or web address]    Consulted and Agree with Plan of Care Patient             Patient will benefit from skilled therapeutic intervention in order to improve the following deficits and impairments:  Decreased strength, Decreased mobility, Decreased balance, Impaired flexibility, Decreased activity tolerance, Difficulty walking, Decreased range of motion, Pain, Impaired UE functional use  Visit Diagnosis: Chronic left shoulder pain  Muscle weakness (generalized)  Difficulty in walking, not elsewhere classified  Stiffness of left shoulder, not elsewhere classified     Problem List Patient Active Problem List   Diagnosis Date Noted   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/24/2020   Pain in left hip 12/24/2020   Labile hypertension 11/23/2020   Abnormal heart sounds 11/23/2020   Postural dizziness with near syncope 11/23/2020   Acute right-sided low back pain without sciatica 12/18/2018   BPH (benign prostatic hyperplasia)    Hypertension    OBSTRUCTIVE SLEEP APNEA 07/29/2009   HYPERLIPIDEMIA TYPE I / IV 04/14/2009   DYSPNEA 04/14/2009   TOE PAIN 09/03/2008   INSOMNIA UNSPECIFIED 06/13/2008   ACUTE  BRONCHITIS 07/19/2007   IRRITABLE BOWEL SYNDROME 07/19/2007   HYPERTROPHY PROSTATE W/O UR OBST & OTH LUTS 07/05/2007   Hyperlipemia 10/12/2006   HYPERTENSION, BENIGN SYSTEMIC 10/12/2006   Hyperplasia of prostate 10/12/2006    Oretha Caprice, PT, MPT 01/26/2021, 8:43 AM  [facility] [address] [place], [place], [address] Phone: [phone]   Fax:  [phone]  Name: John Evans MRN: [id number] Date of Birth: [birth year]

## 2021-02-01 ENCOUNTER — Other Ambulatory Visit (HOSPITAL_COMMUNITY): Payer: Self-pay

## 2021-02-01 ENCOUNTER — Other Ambulatory Visit: Payer: Self-pay

## 2021-02-01 ENCOUNTER — Encounter: Payer: Self-pay | Admitting: Physical Therapy

## 2021-02-01 ENCOUNTER — Ambulatory Visit: Payer: Medicare Other | Admitting: Physical Therapy

## 2021-02-01 DIAGNOSIS — R262 Difficulty in walking, not elsewhere classified: Secondary | ICD-10-CM

## 2021-02-01 DIAGNOSIS — M6281 Muscle weakness (generalized): Secondary | ICD-10-CM | POA: Diagnosis not present

## 2021-02-01 DIAGNOSIS — M25512 Pain in left shoulder: Secondary | ICD-10-CM | POA: Diagnosis not present

## 2021-02-01 DIAGNOSIS — G8929 Other chronic pain: Secondary | ICD-10-CM

## 2021-02-01 DIAGNOSIS — M25612 Stiffness of left shoulder, not elsewhere classified: Secondary | ICD-10-CM | POA: Diagnosis not present

## 2021-02-01 MED FILL — Finasteride Tab 5 MG: ORAL | 30 days supply | Qty: 30 | Fill #1 | Status: AC

## 2021-02-01 NOTE — Therapy (Signed)
[place], [place], [address] Phone: [phone]   Fax:  [phone]  Physical Therapy Treatment  Patient Details  Name: John Evans MRN: [id number] Date of Birth: [birth year] Referring Provider (PT): Joni Fears, MD   Encounter Date: 02/01/2021   PT End of Session - 02/01/21 0853     Visit Number 7    Number of Visits 14    Date for PT Re-Evaluation 03/05/21    Authorization Type Progress note sent on 01/20/2021 at 4th visit and routed to Dr. Durward Fortes    Progress Note Due on Visit 14    PT Start Time 0842    PT Stop Time 0920    PT Time Calculation (min) 38 min    Activity Tolerance Patient tolerated treatment well;No increased pain    Behavior During Therapy WFL for tasks assessed/performed             Past Medical History:  Diagnosis Date   BPH (benign prostatic hyperplasia)    Gout    Hyperlipidemia    Patient reportedly had a coronary calcium score of 21 last year I cannot find his report   Hypertension    Insomnia    Obstructive sleep apnea    Panic attacks     Past Surgical History:  Procedure Laterality Date   BACK SURGERY     EYE SURGERY      There were no vitals filed for this visit.   Subjective Assessment - 02/01/21 0851     Subjective Pt arriving today reporting 4/10 pain in left shoulder and 4/10 pain in bilateral knees.    Pertinent History partial tearing of distal subscapularis, intra-articular portion of biceps long head without a tear. AC joint degerative changes and minor glenohumeral degenerative changes.    Limitations Walking;House hold activities;Other (comment);Lifting    Diagnostic tests partial tear of suprspinatus, 50% of tendon thickness,  partial tearing of distal subscapularis, intra-articular portion of biceps long head without a tear. AC joint degerative changes and minor glenohumeral degenerative changes.    Patient Stated Goals Stop hurting    Currently in  Pain? Yes    Pain Score 4     Pain Location Shoulder    Pain Orientation Left    Pain Descriptors / Indicators Aching;Sore    Pain Type Chronic pain    Pain Onset More than a month ago    Pain Score 4    Pain Location Knee    Pain Orientation Right;Left    Pain Descriptors / Indicators Aching    Pain Type Chronic pain    Pain Onset More than a month ago    Pain Frequency Intermittent                               [facility] Adult PT Treatment/Exercise - 02/01/21 0001       Exercises   Exercises Knee/Hip;Shoulder      Knee/Hip Exercises: Stretches   Piriformis Stretch Both;2 reps;20 seconds    Piriformis Stretch Limitations pulling knee toward opposite shoulder and then pushing knee away    Gastroc Stretch Both;30 seconds    Gastroc Stretch Limitations slant board      Knee/Hip Exercises: Aerobic   Recumbent Bike bike/UBE x 8 minutes L6 (arms/legs)      Knee/Hip Exercises: Standing   Forward Step Up Both;20 reps;Hand Hold: 1;Step Height: 6"    Functional Squat 2 sets;10 reps  Functional Squat Limitations UE support at sink      Knee/Hip Exercises: Seated   Long Arc Quad Strengthening;Both;15 reps    Long Arc Quad Weight 5 lbs.    Other Seated Knee/Hip Exercises seated SLR x 10 reps bil      Knee/Hip Exercises: Supine   Straight Leg Raise with External Rotation Strengthening;Both;15 reps      Shoulder Exercises: Standing   External Rotation Strengthening;Left;20 reps;Theraband    Theraband Level (Shoulder External Rotation) Level 3 (Green)    Internal Rotation Left;Strengthening;20 reps;Theraband    Theraband Level (Shoulder Internal Rotation) Level 3 (Green)    ABduction Strengthening;Left;15 reps;Theraband    Theraband Level (Shoulder ABduction) Level 2 (Red)    Extension Strengthening;Both;15 reps;Limitations;Theraband    Theraband Level (Shoulder Extension) Level 3 (Green)   2 sets   [employer];Theraband;15 reps;Limitations    Row  Limitations BATCA x 15 # 2 x 15                      PT Short Term Goals - 02/01/21 0859       PT SHORT TERM GOAL #1   Title Pt will be independent in his initial HEP.    Status Achieved      PT SHORT TERM GOAL #2   Title Pt will be able to perform 5 x sit to stand in </= 12 seconds with </= single UE support from standard height arm chair.    Status On-going               PT Long Term Goals - 02/01/21 0859       PT LONG TERM GOAL #1   Title Pt will be independent in his advance HEP for LE strengtheing and left shoulder ROM/strengthening.    Status On-going      PT LONG TERM GOAL #2   Title Pt will be able to increase his left shouder flexion/abd to ./= 160 degrees with pain </= 2/10.    Baseline 152 on 01/20/2021    Status On-going      PT LONG TERM GOAL #3   Title Pt will improve his left shoulder strength to grossly 5/5 in order to improve functional mobility.    Baseline grossly 4+/5 on 01/20/2021    Status On-going      PT LONG TERM GOAL #4   Title Pt will improve his FOTO to >/= 64.    Status On-going                   Plan - 02/01/21 2130     Clinical Impression Statement Pt tolerating exercises well, no increased pain during session. Pt concerned about going up and down steps on his trip to [place] next month. Performing stair training and funcitonal squats with UE support. Pt is making progress with skilled PT continue toward goals set.    Personal Factors and Comorbidities Comorbidity 3+    Comorbidities BPH, gout, hyperlipidemia, panic attacks, obstructive sleep apnea    Examination-Activity Limitations Lift;Dressing;Transfers;Squat;Stairs    Examination-Participation Restrictions Other;Yard Work    Stability/Clinical Decision Making Stable/Uncomplicated    Rehab Potential Good    PT Frequency 2x / week    PT Duration 6 weeks    PT Treatment/Interventions ADLs/Self Care Home Management;Cryotherapy;Electrical Stimulation;Iontophoresis  4mg /ml Dexamethasone;Moist Heat;Ultrasound;Gait training;Stair training;Functional mobility training;Therapeutic activities;Therapeutic exercise;Balance training;Neuromuscular re-education;Patient/family education;Passive range of motion;Manual techniques;Dry needling;Taping    PT Next Visit Plan stari traning, funcitonal squats, shouder ROM, LE  strengthening for quads and extensors, sit to stand; work on shoulder strengthening    PT Home Exercise Plan Access Code: [email or web address]    Consulted and Agree with Plan of Care Patient             Patient will benefit from skilled therapeutic intervention in order to improve the following deficits and impairments:  Decreased strength, Decreased mobility, Decreased balance, Impaired flexibility, Decreased activity tolerance, Difficulty walking, Decreased range of motion, Pain, Impaired UE functional use  Visit Diagnosis: Chronic left shoulder pain  Muscle weakness (generalized)  Difficulty in walking, not elsewhere classified  Stiffness of left shoulder, not elsewhere classified     Problem List Patient Active Problem List   Diagnosis Date Noted   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/24/2020   Pain in left hip 12/24/2020   Labile hypertension 11/23/2020   Abnormal heart sounds 11/23/2020   Postural dizziness with near syncope 11/23/2020   Acute right-sided low back pain without sciatica 12/18/2018   BPH (benign prostatic hyperplasia)    Hypertension    OBSTRUCTIVE SLEEP APNEA 07/29/2009   HYPERLIPIDEMIA TYPE I / IV 04/14/2009   DYSPNEA 04/14/2009   TOE PAIN 09/03/2008   INSOMNIA UNSPECIFIED 06/13/2008   ACUTE BRONCHITIS 07/19/2007   IRRITABLE BOWEL SYNDROME 07/19/2007   HYPERTROPHY PROSTATE W/O UR OBST & OTH LUTS 07/05/2007   Hyperlipemia 10/12/2006   HYPERTENSION, BENIGN SYSTEMIC 10/12/2006   Hyperplasia of prostate 10/12/2006    Oretha Caprice, PT,  MPT 02/01/2021, 9:09 AM  [facility] [address] [place], [place], [address] Phone: [phone]   Fax:  [phone]  Name: John Evans MRN: [id number] Date of Birth: 1941-03-19

## 2021-02-02 ENCOUNTER — Ambulatory Visit: Payer: Medicare Other | Admitting: Physical Therapy

## 2021-02-02 DIAGNOSIS — R262 Difficulty in walking, not elsewhere classified: Secondary | ICD-10-CM | POA: Diagnosis not present

## 2021-02-02 DIAGNOSIS — M25512 Pain in left shoulder: Secondary | ICD-10-CM

## 2021-02-02 DIAGNOSIS — G8929 Other chronic pain: Secondary | ICD-10-CM | POA: Diagnosis not present

## 2021-02-02 DIAGNOSIS — M25612 Stiffness of left shoulder, not elsewhere classified: Secondary | ICD-10-CM | POA: Diagnosis not present

## 2021-02-02 DIAGNOSIS — M6281 Muscle weakness (generalized): Secondary | ICD-10-CM

## 2021-02-02 NOTE — Therapy (Signed)
[place], [place], [address] Phone: [phone]   Fax:  [phone]  Physical Therapy Treatment  Patient Details  Name: John Evans MRN: [id number] Date of Birth: 13-Nov-1940 Referring Provider (PT): Joni Fears, MD   Encounter Date: 02/02/2021   PT End of Session - 02/02/21 0807     Visit Number 8    Number of Visits 14    Date for PT Re-Evaluation 03/05/21    Authorization Type Progress note sent on 01/20/2021 at 4th visit and routed to Dr. Durward Fortes    Progress Note Due on Visit 14    PT Start Time 0800    PT Stop Time 0838    PT Time Calculation (min) 38 min    Activity Tolerance Patient tolerated treatment well;No increased pain    Behavior During Therapy WFL for tasks assessed/performed             Past Medical History:  Diagnosis Date   BPH (benign prostatic hyperplasia)    Gout    Hyperlipidemia    Patient reportedly had a coronary calcium score of 21 last year I cannot find his report   Hypertension    Insomnia    Obstructive sleep apnea    Panic attacks     Past Surgical History:  Procedure Laterality Date   BACK SURGERY     EYE SURGERY      There were no vitals filed for this visit.   Subjective Assessment - 02/02/21 0806     Subjective Pt reporting 4/10 pain today in left shoulder.    Pertinent History partial tearing of distal subscapularis, intra-articular portion of biceps long head without a tear. AC joint degerative changes and minor glenohumeral degenerative changes.    Limitations Walking;House hold activities;Other (comment);Lifting    Diagnostic tests partial tear of suprspinatus, 50% of tendon thickness,  partial tearing of distal subscapularis, intra-articular portion of biceps long head without a tear. AC joint degerative changes and minor glenohumeral degenerative changes.    Currently in Pain? Yes    Pain Score 4     Pain Location Shoulder    Pain Onset More than  a month ago                [facility] PT Assessment - 02/02/21 0001       Assessment   Medical Diagnosis left shoulder pain M25.512    Referring Provider (PT) Joni Fears, MD      Precautions   Precautions None      Restrictions   Weight Bearing Restrictions No      Balance Screen   Has the patient fallen in the past 6 months No    Is the patient reluctant to leave their home because of a fear of falling?  No      AROM   Left Shoulder Flexion 160 Degrees    Left Shoulder ABduction 160 Degrees    Left Shoulder Internal Rotation 80 Degrees    Left Shoulder External Rotation 68 Degrees                           [facility] Adult PT Treatment/Exercise - 02/02/21 0001       Knee/Hip Exercises: Aerobic   Recumbent Bike bike/UBE x 8 minutes L6 (arms/legs)      Knee/Hip Exercises: Seated   Other Seated Knee/Hip Exercises seated SLR x 10 reps bil    Sit to Sand 5 reps;without  UE support      Shoulder Exercises: Standing   ABduction Strengthening;Left;15 reps;AAROM    Shoulder ABduction Weight (lbs) 3# bar    ABduction Limitations holding 10 seconds    Extension Limitations BATCA: 5# bilateral shoulders    Row Limitations BATCA: 25# 2x15      Shoulder Exercises: Stretch   Other Shoulder Stretches Door stretch x 3 positions holding 1 minute each, ball up the wall for bilateral shoulder stretches holding 10 seconds each using red physioball x 10                      PT Short Term Goals - 02/02/21 0817       PT SHORT TERM GOAL #1   Title Pt will be independent in his initial HEP.    Status Achieved      PT SHORT TERM GOAL #2   Title Pt will be able to perform 5 x sit to stand in </= 12 seconds with </= single UE support from standard height arm chair.    Baseline 11 seconds with no UE support on 02/02/2021    Status Achieved               PT Long Term Goals - 02/02/21 0819       PT LONG TERM GOAL #1   Title Pt will be independent  in his advance HEP for LE strengtheing and left shoulder ROM/strengthening.    Status On-going      PT LONG TERM GOAL #2   Baseline 162 passively on 02/02/2021.    Status On-going      PT LONG TERM GOAL #3   Title Pt will improve his left shoulder strength to grossly 5/5 in order to improve functional mobility.    Status On-going      PT LONG TERM GOAL #4   Title Pt will improve his FOTO to >/= 64.                   Plan - 02/02/21 0811     Clinical Impression Statement Pt tolerating treatment well today focusing on left shoulder range of motion and strengthening.Addding prolonged stretching for left shoulder.  No reports of increased pain during session. Continue skilled PT to maximize funciton.    Personal Factors and Comorbidities Comorbidity 3+    Comorbidities BPH, gout, hyperlipidemia, panic attacks, obstructive sleep apnea    Examination-Activity Limitations Lift;Dressing;Transfers;Squat;Stairs    Examination-Participation Restrictions Other;Yard Work    Stability/Clinical Decision Making Stable/Uncomplicated    Rehab Potential Good    PT Frequency 2x / week    PT Duration 6 weeks    PT Treatment/Interventions ADLs/Self Care Home Management;Cryotherapy;Electrical Stimulation;Iontophoresis 4mg /ml Dexamethasone;Moist Heat;Ultrasound;Gait training;Stair training;Functional mobility training;Therapeutic activities;Therapeutic exercise;Balance training;Neuromuscular re-education;Patient/family education;Passive range of motion;Manual techniques;Dry needling;Taping    PT Next Visit Plan stari traning, funcitonal squats, shouder ROM, LE strengthening for quads and extensors, sit to stand; work on shoulder strengthening    PT Home Exercise Plan Access Code: [email or web address]    Consulted and Agree with Plan of Care Patient             Patient will benefit from skilled therapeutic intervention in order to improve the following deficits and impairments:  Decreased strength,  Decreased mobility, Decreased balance, Impaired flexibility, Decreased activity tolerance, Difficulty walking, Decreased range of motion, Pain, Impaired UE functional use  Visit Diagnosis: Chronic left shoulder pain  Muscle weakness (generalized)  Difficulty in walking, not elsewhere classified  Stiffness of left shoulder, not elsewhere classified     Problem List Patient Active Problem List   Diagnosis Date Noted   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/24/2020   Pain in left hip 12/24/2020   Labile hypertension 11/23/2020   Abnormal heart sounds 11/23/2020   Postural dizziness with near syncope 11/23/2020   Acute right-sided low back pain without sciatica 12/18/2018   BPH (benign prostatic hyperplasia)    Hypertension    OBSTRUCTIVE SLEEP APNEA 07/29/2009   HYPERLIPIDEMIA TYPE I / IV 04/14/2009   DYSPNEA 04/14/2009   TOE PAIN 09/03/2008   INSOMNIA UNSPECIFIED 06/13/2008   ACUTE BRONCHITIS 07/19/2007   IRRITABLE BOWEL SYNDROME 07/19/2007   HYPERTROPHY PROSTATE W/O UR OBST & OTH LUTS 07/05/2007   Hyperlipemia 10/12/2006   HYPERTENSION, BENIGN SYSTEMIC 10/12/2006   Hyperplasia of prostate 10/12/2006    Oretha Caprice, PT, MPT 02/02/2021, 8:44 AM  [facility] [address] [place], [place], [address] Phone: [phone]   Fax:  [phone]  Name: John Evans MRN: [id number] Date of Birth: 02/24/1941

## 2021-02-08 ENCOUNTER — Encounter: Payer: Self-pay | Admitting: Cardiology

## 2021-02-08 ENCOUNTER — Other Ambulatory Visit (HOSPITAL_COMMUNITY): Payer: Self-pay

## 2021-02-08 ENCOUNTER — Ambulatory Visit: Payer: Medicare Other | Admitting: Cardiology

## 2021-02-08 ENCOUNTER — Other Ambulatory Visit: Payer: Self-pay

## 2021-02-08 VITALS — BP 131/79 | HR 98 | Ht 71.0 in | Wt 190.6 lb

## 2021-02-08 DIAGNOSIS — R55 Syncope and collapse: Secondary | ICD-10-CM

## 2021-02-08 DIAGNOSIS — R42 Dizziness and giddiness: Secondary | ICD-10-CM

## 2021-02-08 DIAGNOSIS — R012 Other cardiac sounds: Secondary | ICD-10-CM | POA: Diagnosis not present

## 2021-02-08 DIAGNOSIS — R0989 Other specified symptoms and signs involving the circulatory and respiratory systems: Secondary | ICD-10-CM

## 2021-02-08 NOTE — Patient Instructions (Signed)
Medication Instructions:  No change  *If you need a refill on your cardiac medications before your next appointment, please call your pharmacy*   Lab Work:  Not needed    Testing/Procedures:  Not needed  Follow-Up: At [facility], you and your health needs are our priority.  As part of our continuing mission to provide you with exceptional heart care, we have created designated Provider Care Teams.  These Care Teams include your primary Cardiologist (physician) and Advanced Practice Providers (APPs -  Physician Assistants and Nurse Practitioners) who all work together to provide you with the care you need, when you need it.     Your next appointment:   7 to 8 month(s)  The format for your next appointment:   In Person  Provider:   Glenetta Hew, MD   Other Instructions   Continue wearing support socks  Keep hydrated  Contact office if you have any other dizziness in the future

## 2021-02-08 NOTE — Progress Notes (Signed)
Primary Care Provider: Susy Frizzle, MD Cardiologist: None Electrophysiologist: None  Clinic Note: Chief Complaint  Patient presents with   Follow-up    Feeling much better.  Test results.   Hypertension    Labile hypertension with orthostatic hypotension.  Much better having shifted Proscar dosing.  No longer on ACE inhibitor.    ===================================  ASSESSMENT/PLAN   Problem List Items Addressed This Visit     Abnormal heart sounds    Echo did not really show that much in the way of any significant abnormalities.  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  S4 gallop is probably more likely than not midsystolic click.       Postural dizziness with near syncope - Primary    Notably improved.  I think he probably does have some tendency to have low pressures in the afternoons, seems to be doing better having shifted his Proscar dosing.  I think at this point we just simply need to allow for permissive hypertension to avoid the hypotension.  Regardless of when we treat him with ACE inhibitor, he would have hypotension. Using the Proscar in the evening allows [place] to take advantage of the low pressures/fatigue.  The only concern would be nocturia.  I told him to make sure that he is very careful when he makes up to take time to call berry to himself before getting up to go to the bathroom.  Ensure adequate hydration       Labile hypertension (Chronic)    24-hour monitor showed low blood pressures in the 90s with highest blood pressures in the 947M systolic.  Based on that, I do not think we need to treat his hypertension.  I would allow for permissive hypertension to avoid the labile hypotension issues with autonomic instability.  He seems to doing well.  Feeling much better.  Plan: Permissive hypertension-would not restart ACE inhibitor at this point.; adequate hydration.  -> In the future, berry to have significantly elevated pressures, we could use  PRN hydralazine        ===================================  HPI:    John Evans is a 80 y.o. male with a PMH notable for Labile HTN (with significant Orthostatic Hypotension / Postural Dizziness/Near Syncope who presents today for 2 month f/u after medication adjustment & Ambulatory BP monitoring.John Evans was last seen on 11/23/2020 at the request of Dr. Dennard Schaumann to for evaluation of his labile BP. --> noted feeling extremely fatigued & lightheaded / dizzy lasting several hours.  He had stopped his ACE-I - which was started for recorded SBPs in the 160s-180s.  SBPs in afternoons had been as low as 90s-100s.  He was accompanied by his wife, with their daughter on speaker phone (doing most of the talking).  Echo ordered along with 24 hr ambulatory BP monitoring Changed Proscar to PM dose --> planned to restart Lisinopril @ 1/2 original dose (reducing to 5 mg) after ~1-2 weeks.  Also asked to ensure adequate hydration.  Recent Hospitalizations: n/a  Reviewed  CV studies:    The following studies were reviewed today: (if available, images/films reviewed: From Epic Chart or Care Everywhere) TTE 01/04/2021: LVEF 55-60%. No RWMA. Normal DF.  Mild MR. Mod AoV Calcification - Sclerosis/ No Stenosis. Normal RV & PAP .Normal CVP.   24-hour BP 01/04/2021 Ambulatory BP monitor indicated the highest pressures were noted as "whitecoat "with blood pressure of 150/90 mmHg. This occurred at roughly noon. Lowest blood pressure readings were at  1622 hrs, and 0200 hrs. Lowest blood pressure was 93/49 mmHg Blood pressure did seem to trend down by 3:30 PM and started to go back up by 6 PM. Pressures were then go down again at night roughly 10 PM and then again at 2 AM.   Based on these recordings being off of antihypertensive and only taking Proscar at bedtime.  I would recommend actually not restarting ACE inhibitor.   Recommend adequate hydration during the day, would also consider support  stockings.   Need to be very careful when getting up to go to the bathroom, especially at night.   If he is overly symptomatic, can consider use of midodrine 2.5 mg daily ~ 2:30 PM to avoid drops.    Interval History:   John Evans is today alone indicating that he is feeling much better.  He still has a little bit of fatigue during the afternoons, but nothing like he was having before.  His pressures in the afternoon seem to be doing better having switch his Proscar time.  The ambulatory monitor was exactly accurate based on what he had described.  He is no longer having any the symptoms of fatigue and lightheadedness/dizziness now.  He has not required any midodrine, but per my recommendation, did not restart his ACE inhibitor.  At this point I think we just need to simply tolerate mild permissive hypertension in the evenings and mornings.  He has not had any symptoms of chest pain or pressure with rest or exertion.  No CHF symptoms of PND orthopnea or edema.  No rapid or irregular heartbeats / palpitations.  He may feel little bit tired and weak/dizzy in the afternoons but no syncope or near syncope.   REVIEWED OF SYSTEMS   Review of Systems  Constitutional:  Negative for malaise/fatigue (notably improved mid afternoon Sx.).  HENT:  Negative for nosebleeds.   Respiratory:  Negative for cough and shortness of breath.   Gastrointestinal:  Negative for blood in stool and melena.  Genitourinary:  Negative for hematuria.  Musculoskeletal:  Positive for joint pain. Negative for falls.  Neurological:  Positive for dizziness (Notably improved). Negative for focal weakness and weakness.  Psychiatric/Behavioral:  Positive for memory loss (some).    I have reviewed and (if needed) personally updated the patient's problem list, medications, allergies, past medical and surgical history, social and family history.   PAST MEDICAL HISTORY   Past Medical History:  Diagnosis Date   BPH  (benign prostatic hyperplasia)    Gout    Hyperlipidemia    Patient reportedly had a coronary calcium score of 21 last year I cannot find his report   Hypertension    Insomnia    Obstructive sleep apnea    Panic attacks     PAST SURGICAL HISTORY   Past Surgical History:  Procedure Laterality Date   BACK SURGERY     EYE SURGERY      Immunization History  Administered Date(s) Administered   Influenza, High Dose Seasonal PF 06/01/2018   Moderna SARS-COV2 Booster Vaccination 07/23/2020   Moderna Sars-Covid-2 Vaccination 08/07/2019, 09/04/2019   Pneumococcal Conjugate-13 09/05/2013   Pneumococcal Polysaccharide-23 05/15/2002, 01/03/2017   Td 08/15/1978    MEDICATIONS/ALLERGIES   Current Meds  Medication Sig   allopurinol (ZYLOPRIM) 300 MG tablet TAKE 1 TABLET (300 MG TOTAL) BY MOUTH DAILY.   aspirin 81 MG tablet Take 81 mg by mouth daily. Every other day   Cholecalciferol (VITAMIN D3) 50 MCG (2000 UT) TABS Take  by mouth.   Cyanocobalamin (VITAMIN B 12 PO) Take by mouth.   finasteride (PROSCAR) 5 MG tablet Take 1 tablet (5 mg total) by mouth daily with supper.   tretinoin (RETIN-A) 0.025 % cream APPLY ON THE SKIN AT BEDTIME; APPLY TO FACE   [DISCONTINUED] allopurinol (ZYLOPRIM) 300 MG tablet TAKE 1 TABLET (300 MG TOTAL) BY MOUTH DAILY.    Allergies  Allergen Reactions   Latex Rash    SOCIAL HISTORY/FAMILY HISTORY   Reviewed in Epic:  Pertinent findings:  Social History   Tobacco Use   Smoking status: Never   Smokeless tobacco: Never  Substance Use Topics   Alcohol use: No   Drug use: No   Social History   Social History Narrative   Not on file    OBJCTIVE -PE, EKG, labs   Wt Readings from Last 3 Encounters:  02/08/21 190 lb 9.6 oz (86.5 kg)  12/15/20 190 lb (86.2 kg)  11/23/20 191 lb (86.6 kg)    Physical Exam: BP 131/79 (BP Location: Right Arm, Patient Position: Sitting, Cuff Size: Normal)   Pulse 98   Ht 5\' 11"  (1.803 m)   Wt 190 lb 9.6 oz  (86.5 kg)   SpO2 97%   BMI 26.58 kg/m  Physical Exam Vitals reviewed.  Constitutional:      General: He is not in acute distress.    Appearance: Normal appearance. He is normal weight. He is not ill-appearing or toxic-appearing.  HENT:     Head: Normocephalic and atraumatic.  Neck:     Vascular: No carotid bruit.  Cardiovascular:     Rate and Rhythm: Normal rate and regular rhythm. No extrasystoles are present.    Chest Wall: PMI is not displaced.     Pulses: Normal pulses.     Heart sounds: Murmur heard.  Harsh crescendo-decrescendo midsystolic murmur is present with a grade of 2/6 at the upper right sternal border radiating to the neck.    Gallop present. S4 sounds present.  Pulmonary:     Effort: Pulmonary effort is normal.     Breath sounds: Normal breath sounds. No wheezing, rhonchi or rales.  Chest:     Chest wall: No tenderness.  Musculoskeletal:        General: No swelling. Normal range of motion.     Cervical back: Normal range of motion and neck supple.  Skin:    General: Skin is warm and dry.  Neurological:     General: No focal deficit present.     Mental Status: He is alert and oriented to person, place, and time. Mental status is at baseline.  Psychiatric:        Mood and Affect: Mood normal.        Behavior: Behavior normal.        Thought Content: Thought content normal.        Judgment: Judgment normal.     Adult ECG Report N/a  Recent Labs:  n/a  Lab Results  Component Value Date   CHOL 172 12/27/2016   HDL 36 (L) 12/27/2016   LDLCALC 108 (H) 12/27/2016   TRIG 142 12/27/2016   CHOLHDL 4.8 12/27/2016   Lab Results  Component Value Date   CREATININE 0.87 11/06/2020   BUN 12 11/06/2020   NA 142 11/06/2020   K 4.9 11/06/2020   CL 105 11/06/2020   CO2 26 11/06/2020   CBC Latest Ref Rng & Units 11/06/2020 07/05/2018 12/27/2016  WBC 3.8 - 10.8 Thousand/uL 6.7 6.0 5.2  Hemoglobin 13.2 - 17.1 g/dL 15.8 16.6 15.3  Hematocrit 38.5 - 50.0 % 45.8  47.3 45.2  Platelets 140 - 400 Thousand/uL 203 196 172    Lab Results  Component Value Date   TSH 2.06 12/22/2015    ==================================================  COVID-19 Education: The signs and symptoms of COVID-19 were discussed with the patient and how to seek care for testing (follow up with PCP or arrange E-visit).    I spent a total of 3minutes with the patient spent in direct patient consultation.  Additional time spent with chart review  / charting (studies, outside notes, etc): 14 min Total Time: 32 min  Current medicines are reviewed at length with the patient today.  (+/- concerns) n/a  This visit occurred during the SARS-CoV-2 public health emergency.  Safety protocols were in place, including screening questions prior to the visit, additional usage of staff PPE, and extensive cleaning of exam room while observing appropriate contact time as indicated for disinfecting solutions.  Notice: This dictation was prepared with Dragon dictation along with smaller phrase technology. Any transcriptional errors that result from this process are unintentional and may not be corrected upon review.  Patient Instructions / Medication Changes & Studies & Tests Ordered   Patient Instructions  Medication Instructions:  No change  *If you need a refill on your cardiac medications before your next appointment, please call your pharmacy*   Lab Work:  Not needed    Testing/Procedures:  Not needed  Follow-Up: At [facility], you and your health needs are our priority.  As part of our continuing mission to provide you with exceptional heart care, we have created designated Provider Care Teams.  These Care Teams include your primary Cardiologist (physician) and Advanced Practice Providers (APPs -  Physician Assistants and Nurse Practitioners) who all work together to provide you with the care you need, when you need it.     Your next appointment:   7 to 8  month(s)  The format for your next appointment:   In Person  Provider:   Glenetta Hew, MD   Other Instructions   Continue wearing support socks  Keep hydrated  Contact office if you have any other dizziness in the future   Studies Ordered:   No orders of the defined types were placed in this encounter.    Glenetta Hew, M.D., M.S. Interventional Cardiologist   Pager # [phone] Phone # [phone] [address] [place], [place] [address]   Thank you for choosing Heartcare at [facility]!!

## 2021-02-09 ENCOUNTER — Ambulatory Visit: Payer: Medicare Other | Admitting: Physical Therapy

## 2021-02-09 ENCOUNTER — Encounter: Payer: Self-pay | Admitting: Physical Therapy

## 2021-02-09 DIAGNOSIS — R262 Difficulty in walking, not elsewhere classified: Secondary | ICD-10-CM

## 2021-02-09 DIAGNOSIS — M25512 Pain in left shoulder: Secondary | ICD-10-CM | POA: Diagnosis not present

## 2021-02-09 DIAGNOSIS — G8929 Other chronic pain: Secondary | ICD-10-CM

## 2021-02-09 DIAGNOSIS — M25612 Stiffness of left shoulder, not elsewhere classified: Secondary | ICD-10-CM

## 2021-02-09 DIAGNOSIS — M6281 Muscle weakness (generalized): Secondary | ICD-10-CM

## 2021-02-09 NOTE — Therapy (Signed)
[place], [place], [address] Phone: [phone]   Fax:  [phone]  Physical Therapy Treatment  Patient Details  Name: John Evans MRN: [id number] Date of Birth: [birth year] Referring Provider (PT): Joni Fears, MD   Encounter Date: 02/09/2021   PT End of Session - 02/09/21 1135     Visit Number 9    Number of Visits 14    Date for PT Re-Evaluation 03/05/21    Authorization Type Progress note sent on 01/20/2021 at 4th visit and routed to Dr. Durward Fortes    Progress Note Due on Visit 14    PT Start Time 1058    PT Stop Time 1138    PT Time Calculation (min) 40 min    Activity Tolerance Patient tolerated treatment well;No increased pain    Behavior During Therapy WFL for tasks assessed/performed             Past Medical History:  Diagnosis Date   BPH (benign prostatic hyperplasia)    Gout    Hyperlipidemia    Patient reportedly had a coronary calcium score of 21 last year I cannot find his report   Hypertension    Insomnia    Obstructive sleep apnea    Panic attacks     Past Surgical History:  Procedure Laterality Date   BACK SURGERY     EYE SURGERY      There were no vitals filed for this visit.   Subjective Assessment - 02/09/21 1102     Subjective shoulder still hurts, doesn't want surgery    Pertinent History partial tearing of distal subscapularis, intra-articular portion of biceps long head without a tear. AC joint degerative changes and minor glenohumeral degenerative changes.    Limitations Walking;House hold activities;Other (comment);Lifting    Diagnostic tests partial tear of suprspinatus, 50% of tendon thickness,  partial tearing of distal subscapularis, intra-articular portion of biceps long head without a tear. AC joint degerative changes and minor glenohumeral degenerative changes.    Patient Stated Goals Stop hurting    Currently in Pain? Yes    Pain Score 4     Pain Location  Shoulder    Pain Orientation Left    Pain Descriptors / Indicators Aching;Sore    Pain Type Chronic pain    Pain Onset More than a month ago    Pain Frequency Constant    Aggravating Factors  lying on Lt side, reaching, lifting    Pain Relieving Factors ibuprofen                               [facility] Adult PT Treatment/Exercise - 02/09/21 1100       Knee/Hip Exercises: Aerobic   Recumbent Bike bike/UBE x 8 minutes L6 (arms/legs)      Shoulder Exercises: Standing   External Rotation Left;Theraband   3x10   Theraband Level (Shoulder External Rotation) Level 4 (Blue)    Internal Rotation Left;Theraband   3x10   Theraband Level (Shoulder Internal Rotation) Level 4 (Blue)    Flexion Left;Weights   3x10   Shoulder Flexion Weight (lbs) 2    ABduction Strengthening;Left;AAROM;20 reps    Shoulder ABduction Weight (lbs) 3# bar    ABduction Limitations holding 10 seconds    Extension Limitations BATCA: 5# bilateral shoulders; 3x10    Row Both;Theraband   3x10   Theraband Level (Shoulder Row) Level 4 (Blue)    Row Limitations BATCA:  25# 3x10    Other Standing Exercises Lt scaption 3x10; 2#                      PT Short Term Goals - 02/02/21 0817       PT SHORT TERM GOAL #1   Title Pt will be independent in his initial HEP.    Status Achieved      PT SHORT TERM GOAL #2   Title Pt will be able to perform 5 x sit to stand in </= 12 seconds with </= single UE support from standard height arm chair.    Baseline 11 seconds with no UE support on 02/02/2021    Status Achieved               PT Long Term Goals - 02/02/21 0819       PT LONG TERM GOAL #1   Title Pt will be independent in his advance HEP for LE strengtheing and left shoulder ROM/strengthening.    Status On-going      PT LONG TERM GOAL #2   Baseline 162 passively on 02/02/2021.    Status On-going      PT LONG TERM GOAL #3   Title Pt will improve his left shoulder strength to grossly  5/5 in order to improve functional mobility.    Status On-going      PT LONG TERM GOAL #4   Title Pt will improve his FOTO to >/= 64.                   Plan - 02/09/21 1135     Clinical Impression Statement Pt tolerated session well today tolerating strengthening well within pain minimized ROM.  He will be out of town next week and then going to [place] in mid July so may need to d/c next 1-2 visits.    Personal Factors and Comorbidities Comorbidity 3+    Comorbidities BPH, gout, hyperlipidemia, panic attacks, obstructive sleep apnea    Examination-Activity Limitations Lift;Dressing;Transfers;Squat;Stairs    Examination-Participation Restrictions Other;Yard Work    Stability/Clinical Decision Making Stable/Uncomplicated    Rehab Potential Good    PT Frequency 2x / week    PT Duration 6 weeks    PT Treatment/Interventions ADLs/Self Care Home Management;Cryotherapy;Electrical Stimulation;Iontophoresis 4mg /ml Dexamethasone;Moist Heat;Ultrasound;Gait training;Stair training;Functional mobility training;Therapeutic activities;Therapeutic exercise;Balance training;Neuromuscular re-education;Patient/family education;Passive range of motion;Manual techniques;Dry needling;Taping    PT Next Visit Plan 10th visit progress note, continue Lt shoulder strengthening as able    PT Home Exercise Plan Access Code: [email or web address]    Consulted and Agree with Plan of Care Patient             Patient will benefit from skilled therapeutic intervention in order to improve the following deficits and impairments:  Decreased strength, Decreased mobility, Decreased balance, Impaired flexibility, Decreased activity tolerance, Difficulty walking, Decreased range of motion, Pain, Impaired UE functional use  Visit Diagnosis: Chronic left shoulder pain  Muscle weakness (generalized)  Difficulty in walking, not elsewhere classified  Stiffness of left shoulder, not elsewhere classified     Problem  List Patient Active Problem List   Diagnosis Date Noted   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/24/2020   Pain in left hip 12/24/2020   Labile hypertension 11/23/2020   Abnormal heart sounds 11/23/2020   Postural dizziness with near syncope 11/23/2020   Acute right-sided low back pain without sciatica 12/18/2018   BPH (benign prostatic hyperplasia)    Hypertension    OBSTRUCTIVE SLEEP  APNEA 07/29/2009   HYPERLIPIDEMIA TYPE I / IV 04/14/2009   DYSPNEA 04/14/2009   TOE PAIN 09/03/2008   INSOMNIA UNSPECIFIED 06/13/2008   ACUTE BRONCHITIS 07/19/2007   IRRITABLE BOWEL SYNDROME 07/19/2007   HYPERTROPHY PROSTATE W/O UR OBST & OTH LUTS 07/05/2007   Hyperlipemia 10/12/2006   HYPERTENSION, BENIGN SYSTEMIC 10/12/2006   Hyperplasia of prostate 10/12/2006       Laureen Abrahams, PT, DPT 02/09/21 11:42 AM     [facility] [address] [place], [place], [address] Phone: [phone]   Fax:  [phone]  Name: ZESHAN SENA MRN: [id number] Date of Birth: 1941/02/05

## 2021-02-12 ENCOUNTER — Ambulatory Visit: Payer: Medicare Other

## 2021-02-17 ENCOUNTER — Encounter: Payer: Medicare Other | Admitting: Physical Therapy

## 2021-02-19 ENCOUNTER — Encounter: Payer: Self-pay | Admitting: Cardiology

## 2021-02-19 NOTE — Assessment & Plan Note (Signed)
Echo did not really show that much in the way of any significant abnormalities.  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  S4 gallop is probably more likely than not midsystolic click.

## 2021-02-19 NOTE — Assessment & Plan Note (Signed)
Notably improved.  I think he probably does have some tendency to have low pressures in the afternoons, seems to be doing better having shifted his Proscar dosing.  I think at this point we just simply need to allow for permissive hypertension to avoid the hypotension.  Regardless of when we treat him with ACE inhibitor, he would have hypotension. Using the Proscar in the evening allows [place] to take advantage of the low pressures/fatigue.  The only concern would be nocturia.  I told him to make sure that he is very careful when he makes up to take time to call berry to himself before getting up to go to the bathroom.  Ensure adequate hydration

## 2021-02-19 NOTE — Assessment & Plan Note (Addendum)
24-hour monitor showed low blood pressures in the 90s with highest blood pressures in the 448J systolic.  Based on that, I do not think we need to treat his hypertension.  I would allow for permissive hypertension to avoid the labile hypotension issues with autonomic instability.  He seems to doing well.  Feeling much better.  Plan: Permissive hypertension-would not restart ACE inhibitor at this point.; adequate hydration.  -> In the future, berry to have significantly elevated pressures, we could use PRN hydralazine

## 2021-02-22 ENCOUNTER — Ambulatory Visit: Payer: Medicare Other | Admitting: Physical Therapy

## 2021-02-22 ENCOUNTER — Encounter: Payer: Self-pay | Admitting: Physical Therapy

## 2021-02-22 ENCOUNTER — Other Ambulatory Visit (HOSPITAL_COMMUNITY): Payer: Self-pay

## 2021-02-22 ENCOUNTER — Encounter: Payer: Medicare Other | Admitting: Physical Therapy

## 2021-02-22 ENCOUNTER — Other Ambulatory Visit: Payer: Self-pay

## 2021-02-22 DIAGNOSIS — M6281 Muscle weakness (generalized): Secondary | ICD-10-CM

## 2021-02-22 DIAGNOSIS — M25612 Stiffness of left shoulder, not elsewhere classified: Secondary | ICD-10-CM

## 2021-02-22 DIAGNOSIS — M25512 Pain in left shoulder: Secondary | ICD-10-CM

## 2021-02-22 DIAGNOSIS — R262 Difficulty in walking, not elsewhere classified: Secondary | ICD-10-CM | POA: Diagnosis not present

## 2021-02-22 DIAGNOSIS — G8929 Other chronic pain: Secondary | ICD-10-CM | POA: Diagnosis not present

## 2021-02-22 MED FILL — Finasteride Tab 5 MG: ORAL | 30 days supply | Qty: 30 | Fill #2 | Status: CN

## 2021-02-22 NOTE — Patient Instructions (Signed)
Access Code: [email or web address] URL: [email or web address] Date: 02/22/2021 Prepared by: Faustino Congress  Exercises Standing Hip Flexor Stretch - 3 x daily - 7 x weekly - 3 reps - 30 seconds hold Supine Shoulder Flexion Extension AAROM with Dowel - 3 x daily - 7 x weekly - 2 sets - 10 reps - 3-5 seconds hold Supine Shoulder External Rotation in 45 Degrees Abduction AAROM with Dowel - 3 x daily - 7 x weekly - 2 sets - 10 reps - 5 seconds hold Standing Bilateral Low Shoulder Row with Anchored Resistance - 3 x daily - 7 x weekly - 2 sets - 10 reps Supine Piriformis Stretch with Foot on Ground - 2 x daily - 7 x weekly - 3 reps - 30 seconds hold Supine Figure 4 Piriformis Stretch - 2 x daily - 7 x weekly - 3 reps - 30 seconds hold Supine Lower Trunk Rotation - 2 x daily - 7 x weekly - 3 sets - 3 reps - 30 seconds hold Standing Bent Over Shoulder Row - 1 x daily - 7 x weekly - 3 sets - 10 reps Standing Shoulder Flexion to 90 Degrees - 1 x daily - 7 x weekly - 3 sets - 10 reps Shoulder Abduction with Dumbbells - Thumbs Up - 1 x daily - 7 x weekly - 3 sets - 10 reps

## 2021-02-22 NOTE — Therapy (Addendum)
[facility] [address] [place], [place], [address] Phone: [phone]   Fax:  [phone]  Physical Therapy Treatment/Progress Note/Recertification/Discharge Summary Progress Note Reporting Period 01/05/21 to 02/22/21   See note below for Objective Data and Assessment of Progress/Goals.      Patient Details  Name: John Evans MRN: [id number] Date of Birth: Dec 02, 1940 Referring Provider (PT): Joni Fears, MD   Encounter Date: 02/22/2021   PT End of Session - 02/22/21 1332     Visit Number 10    Number of Visits 14    Date for PT Re-Evaluation 03/05/21    Authorization Type Progress note sent on 01/20/2021 at 4th visit and routed to Dr. Durward Fortes    Progress Note Due on Visit 14    PT Start Time 1300    PT Stop Time 1340    PT Time Calculation (min) 40 min    Activity Tolerance Patient tolerated treatment well;No increased pain    Behavior During Therapy WFL for tasks assessed/performed             Past Medical History:  Diagnosis Date   BPH (benign prostatic hyperplasia)    Gout    Hyperlipidemia    Patient reportedly had a coronary calcium score of 21 last year I cannot find his report   Hypertension    Insomnia    Obstructive sleep apnea    Panic attacks     Past Surgical History:  Procedure Laterality Date   BACK SURGERY     EYE SURGERY      There were no vitals filed for this visit.   Subjective Assessment - 02/22/21 1301     Subjective leaves for [place] on Thursday - gone for 1 week.    Pertinent History partial tearing of distal subscapularis, intra-articular portion of biceps long head without a tear. AC joint degerative changes and minor glenohumeral degenerative changes.    Limitations Walking;House hold activities;Other (comment);Lifting    Diagnostic tests partial tear of suprspinatus, 50% of tendon thickness,  partial tearing of distal subscapularis, intra-articular portion of biceps long head  without a tear. AC joint degerative changes and minor glenohumeral degenerative changes.    Patient Stated Goals Stop hurting    Currently in Pain? Yes    Pain Score 4     Pain Location Shoulder    Pain Orientation Left    Pain Descriptors / Indicators Aching;Sore    Pain Type Chronic pain    Pain Onset More than a month ago    Pain Frequency Constant    Aggravating Factors  lying on Lt side, reaching, lifting    Pain Relieving Factors ibuprofen                [facility] PT Assessment - 02/22/21 1313       Assessment   Medical Diagnosis left shoulder pain M25.512    Referring Provider (PT) Joni Fears, MD      Observation/Other Assessments   Focus on Therapeutic Outcomes (FOTO)  50      AROM   Left Shoulder Flexion 140 Degrees   sitting; 160 supine   Left Shoulder ABduction 113 Degrees   sitting; 143 supine   Left Shoulder Internal Rotation 90 Degrees    Left Shoulder External Rotation 65 Degrees      Strength   Left Shoulder Flexion 5/5    Left Shoulder ABduction 4/5    Left Shoulder Internal Rotation 5/5    Left Shoulder External Rotation 5/5  [facility] Adult PT Treatment/Exercise - 02/22/21 1303       Knee/Hip Exercises: Aerobic   Recumbent Bike bike/UBE x 8 minutes L5 (arms/legs)      Shoulder Exercises: Standing   Flexion Both;20 reps;Weights    Shoulder Flexion Weight (lbs) 1   in prep for travel and available options for weights   ABduction Strengthening;Both;20 reps;Weights    Shoulder ABduction Weight (lbs) 1    Extension Limitations BATCA extension 3x10 10#    Row Limitations BATCA: 25# 3x10                    PT Education - 02/22/21 1332     Education Details HEP for hotel while traveling    Person(s) Educated Patient    Methods Explanation;Demonstration;Handout    Comprehension Verbalized understanding;Returned demonstration;Need further instruction              PT Short Term Goals -  02/02/21 0817       PT SHORT TERM GOAL #1   Title Pt will be independent in his initial HEP.    Status Achieved      PT SHORT TERM GOAL #2   Title Pt will be able to perform 5 x sit to stand in </= 12 seconds with </= single UE support from standard height arm chair.    Baseline 11 seconds with no UE support on 02/02/2021    Status Achieved               PT Long Term Goals - 02/22/21 1314       PT LONG TERM GOAL #1   Title Pt will be independent in his advance HEP for LE strengtheing and left shoulder ROM/strengthening.    Status On-going    Target Date 03/22/21      PT LONG TERM GOAL #2   Title Pt will be able to increase his left shouder flexion/abd to ./= 160 degrees with pain </= 2/10.    Baseline --    Status On-going    Target Date 03/22/21      PT LONG TERM GOAL #3   Title Pt will improve his left shoulder strength to grossly 5/5 in order to improve functional mobility.    Baseline 7/11: still limited abduction    Status Partially Met    Target Date 03/22/21      PT LONG TERM GOAL #4   Title Pt will improve his FOTO to >/= 64.    Status On-going    Target Date 03/22/21                    Plan - 02/22/21 1333     Clinical Impression Statement Pt has demonstrated progress towards all goals at this time, just not met to date yet.  Recommend PT 2x/wk x 4 more weeks to maximize function.    Personal Factors and Comorbidities Comorbidity 3+    Comorbidities BPH, gout, hyperlipidemia, panic attacks, obstructive sleep apnea    Examination-Activity Limitations Lift;Dressing;Transfers;Squat;Stairs    Examination-Participation Restrictions Other;Yard Work    Stability/Clinical Decision Making Stable/Uncomplicated    Rehab Potential Good    PT Frequency 2x / week    PT Duration 6 weeks    PT Treatment/Interventions ADLs/Self Care Home Management;Cryotherapy;Electrical Stimulation;Iontophoresis 23m/ml Dexamethasone;Moist Heat;Ultrasound;Gait training;Stair  training;Functional mobility training;Therapeutic activities;Therapeutic exercise;Balance training;Neuromuscular re-education;Patient/family education;Passive range of motion;Manual techniques;Dry needling;Taping    PT Next Visit Plan continue Lt shoulder strengthening as able    PT Home  Exercise Plan Access Code: [email or web address]    Consulted and Agree with Plan of Care Patient             Patient will benefit from skilled therapeutic intervention in order to improve the following deficits and impairments:  Decreased strength, Decreased mobility, Decreased balance, Impaired flexibility, Decreased activity tolerance, Difficulty walking, Decreased range of motion, Pain, Impaired UE functional use  Visit Diagnosis: Chronic left shoulder pain - Plan: PT plan of care cert/re-cert  Muscle weakness (generalized) - Plan: PT plan of care cert/re-cert  Stiffness of left shoulder, not elsewhere classified - Plan: PT plan of care cert/re-cert  Difficulty in walking, not elsewhere classified - Plan: PT plan of care cert/re-cert     Problem List Patient Active Problem List   Diagnosis Date Noted   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/24/2020   Pain in left hip 12/24/2020   Labile hypertension 11/23/2020   Abnormal heart sounds 11/23/2020   Postural dizziness with near syncope 11/23/2020   Acute right-sided low back pain without sciatica 12/18/2018   BPH (benign prostatic hyperplasia)    Hypertension    OBSTRUCTIVE SLEEP APNEA 07/29/2009   HYPERLIPIDEMIA TYPE I / IV 04/14/2009   DYSPNEA 04/14/2009   TOE PAIN 09/03/2008   INSOMNIA UNSPECIFIED 06/13/2008   ACUTE BRONCHITIS 07/19/2007   IRRITABLE BOWEL SYNDROME 07/19/2007   HYPERTROPHY PROSTATE W/O UR OBST & OTH LUTS 07/05/2007   Hyperlipemia 10/12/2006   HYPERTENSION, BENIGN SYSTEMIC 10/12/2006   Hyperplasia of prostate 10/12/2006     Laureen Abrahams, PT, DPT 02/22/21 1:42 PM     [facility] [address] [place], [place], [address] Phone: [phone]   Fax:  [phone]  Name: COLUMBUS ICE MRN: [id number] Date of Birth: 1941-06-24      PHYSICAL THERAPY DISCHARGE SUMMARY  Visits from Start of Care: 10  Current functional level related to goals / functional outcomes: See above   Remaining deficits: See above, otherwise unknown.  Pt went to [place] and tested positive for COVID upon returning and did not return to [employer] / Equipment: HEP   Patient agrees to discharge. Patient goals were not met. Patient is being discharged due to not returning since the last visit.  Laureen Abrahams, PT, DPT 04/27/21 2:19 PM  [facility] [address] [place], [place], [address] Phone: [phone]   Fax:  [phone]

## 2021-02-23 ENCOUNTER — Encounter: Payer: Medicare Other | Admitting: Physical Therapy

## 2021-02-24 ENCOUNTER — Other Ambulatory Visit (HOSPITAL_COMMUNITY): Payer: Self-pay

## 2021-02-24 MED FILL — Finasteride Tab 5 MG: ORAL | 30 days supply | Qty: 30 | Fill #2 | Status: AC

## 2021-03-07 ENCOUNTER — Telehealth: Payer: Medicare Other | Admitting: Family

## 2021-03-07 ENCOUNTER — Encounter: Payer: Self-pay | Admitting: Family

## 2021-03-07 DIAGNOSIS — U071 COVID-19: Secondary | ICD-10-CM

## 2021-03-07 MED ORDER — MOLNUPIRAVIR EUA 200MG CAPSULE
4.0000 | ORAL_CAPSULE | Freq: Two times a day (BID) | ORAL | 0 refills | Status: AC
Start: 2021-03-07 — End: 2021-03-12

## 2021-03-07 MED ORDER — BENZONATATE 100 MG PO CAPS: 100.0000 mg | ORAL_CAPSULE | Freq: Three times a day (TID) | ORAL | 0 refills | Status: DC | PRN

## 2021-03-07 MED ORDER — ALBUTEROL SULFATE HFA 108 (90 BASE) MCG/ACT IN AERS: 2.0000 | INHALATION_SPRAY | Freq: Four times a day (QID) | RESPIRATORY_TRACT | 0 refills | Status: DC | PRN

## 2021-03-07 NOTE — Progress Notes (Signed)
Virtual Visit Consent   John Evans, you are scheduled for a virtual visit with a [facility] provider today.     Just as with appointments in the office, your consent must be obtained to participate.  Your consent will be active for this visit and any virtual visit you may have with one of our providers in the next 365 days.     If you have a MyChart account, a copy of this consent can be sent to you electronically.  All virtual visits are billed to your insurance company just like a traditional visit in the office.    As this is a virtual visit, video technology does not allow for your provider to perform a traditional examination.  This may limit your provider's ability to fully assess your condition.  If your provider identifies any concerns that need to be evaluated in person or the need to arrange testing (such as labs, EKG, etc.), we will make arrangements to do so.     Although advances in technology are sophisticated, we cannot ensure that it will always work on either your end or our end.  If the connection with a video visit is poor, the visit may have to be switched to a telephone visit.  With either a video or telephone visit, we are not always able to ensure that we have a secure connection.     I need to obtain your verbal consent now.   Are you willing to proceed with your visit today?    John Evans has provided verbal consent on 03/07/2021 for a virtual visit (video or telephone).   Evelina Dun, FNP   Date: 03/07/2021 10:28 AM   Virtual Visit via Video Note   I, Evelina Dun, connected with  John Evans  ([id number], 15-Jun-1941) on 03/07/21 at 10:30 AM EDT by a video-enabled telemedicine application and verified that I am speaking with the correct person using two identifiers.  Location: Patient: Virtual Visit Location Patient: Home Provider: Virtual Visit Location Provider: Home   I discussed the limitations of evaluation and management by telemedicine  and the availability of in person appointments. The patient expressed understanding and agreed to proceed.    History of Present Illness: John Evans is a 80 y.o. who identifies as a male who was assigned male at birth, and is being seen today for COVID. He states his symptoms started on 03/05/21 and tested positive on 03/06/21.   HPI: Cough This is a new problem. The current episode started in the past 7 days. The problem has been gradually worsening. The problem occurs every few minutes. The cough is Non-productive. Associated symptoms include chills, a fever, headaches, myalgias, nasal congestion and wheezing. Pertinent negatives include no ear congestion, ear pain, postnasal drip, sore throat or shortness of breath. The treatment provided mild relief.   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   Pain in left hip 12/24/2020   Labile hypertension 11/23/2020   Abnormal heart sounds 11/23/2020   Postural dizziness with near syncope 11/23/2020   Acute right-sided low back pain without sciatica 12/18/2018   BPH (benign prostatic hyperplasia)    Hypertension    OBSTRUCTIVE SLEEP APNEA 07/29/2009   HYPERLIPIDEMIA TYPE I / IV 04/14/2009   DYSPNEA 04/14/2009   TOE PAIN 09/03/2008   INSOMNIA UNSPECIFIED 06/13/2008   ACUTE BRONCHITIS 07/19/2007   IRRITABLE BOWEL SYNDROME 07/19/2007   HYPERTROPHY PROSTATE W/O  UR OBST & OTH LUTS 07/05/2007   Hyperlipemia 10/12/2006   HYPERTENSION, BENIGN SYSTEMIC 10/12/2006   Hyperplasia of prostate 10/12/2006    Allergies:  Allergies  Allergen Reactions   Latex Rash   Medications:  Current Outpatient Medications:    albuterol (VENTOLIN HFA) 108 (90 Base) MCG/ACT inhaler, Inhale 2 puffs into the lungs every 6 (six) hours as needed for wheezing or shortness of breath., Disp: 8 g, Rfl: 0   benzonatate (TESSALON PERLES) 100 MG capsule, Take 1 capsule (100 mg total) by mouth 3 (three)  times daily as needed., Disp: 20 capsule, Rfl: 0   molnupiravir EUA 200 mg CAPS, Take 4 capsules (800 mg total) by mouth 2 (two) times daily for 5 days., Disp: 40 capsule, Rfl: 0   allopurinol (ZYLOPRIM) 300 MG tablet, TAKE 1 TABLET (300 MG TOTAL) BY MOUTH DAILY., Disp: 30 tablet, Rfl: 6   aspirin 81 MG tablet, Take 81 mg by mouth daily. Every other day, Disp: , Rfl:    Cholecalciferol (VITAMIN D3) 50 MCG (2000 UT) TABS, Take by mouth., Disp: , Rfl:    Cyanocobalamin (VITAMIN B 12 PO), Take by mouth., Disp: , Rfl:    finasteride (PROSCAR) 5 MG tablet, Take 1 tablet (5 mg total) by mouth daily with supper., Disp: , Rfl:    finasteride (PROSCAR) 5 MG tablet, TAKE 1 TABLET BY MOUTH ONCE A DAY (Patient not taking: No sig reported), Disp: 30 tablet, Rfl: 6   tretinoin (RETIN-A) 0.025 % cream, APPLY ON THE SKIN AT BEDTIME; APPLY TO FACE, Disp: 45 g, Rfl: 2  Observations/Objective: Patient is well-developed, well-nourished in no acute distress.  Resting comfortably  at home.  Head is normocephalic, atraumatic.  No labored breathing.  Speech is clear and coherent with logical content.  Patient is alert and oriented at baseline.  Intermittent cough, no SOB or distress noted   Assessment and Plan: 1. COVID-19 virus detected - molnupiravir EUA 200 mg CAPS; Take 4 capsules (800 mg total) by mouth 2 (two) times daily for 5 days.  Dispense: 40 capsule; Refill: 0 - albuterol (VENTOLIN HFA) 108 (90 Base) MCG/ACT inhaler; Inhale 2 puffs into the lungs every 6 (six) hours as needed for wheezing or shortness of breath.  Dispense: 8 g; Refill: 0 - benzonatate (TESSALON PERLES) 100 MG capsule; Take 1 capsule (100 mg total) by mouth 3 (three) times daily as needed.  Dispense: 20 capsule; Refill: 0 COVID positive, rest, force fluids, tylenol as needed, Quarantine for at least 5 days and fever free, report any worsening symptoms such as increased shortness of breath, swelling, or continued high fevers.  Possible  adverse discussed with antiviral   Follow Up Instructions: I discussed the assessment and treatment plan with the patient. The patient was provided an opportunity to ask questions and all were answered. The patient agreed with the plan and demonstrated an understanding of the instructions.  A copy of instructions were sent to the patient via MyChart.  The patient was advised to call back or seek an in-person evaluation if the symptoms worsen or if the condition fails to improve as anticipated.  Time:  I spent 11 minutes with the patient via telehealth technology discussing the above problems/concerns.    Evelina Dun, FNP

## 2021-03-08 ENCOUNTER — Encounter: Payer: Medicare Other | Admitting: Physical Therapy

## 2021-03-15 ENCOUNTER — Other Ambulatory Visit (HOSPITAL_COMMUNITY): Payer: Self-pay

## 2021-03-29 ENCOUNTER — Other Ambulatory Visit (HOSPITAL_COMMUNITY): Payer: Self-pay

## 2021-03-29 MED FILL — Finasteride Tab 5 MG: ORAL | 30 days supply | Qty: 30 | Fill #3 | Status: CN

## 2021-03-29 MED FILL — Finasteride Tab 5 MG: ORAL | 30 days supply | Qty: 30 | Fill #0 | Status: AC

## 2021-04-14 ENCOUNTER — Other Ambulatory Visit (HOSPITAL_COMMUNITY): Payer: Self-pay

## 2021-04-23 ENCOUNTER — Other Ambulatory Visit (HOSPITAL_COMMUNITY): Payer: Self-pay

## 2021-04-30 ENCOUNTER — Other Ambulatory Visit: Payer: Self-pay | Admitting: Urology

## 2021-04-30 ENCOUNTER — Other Ambulatory Visit (HOSPITAL_COMMUNITY): Payer: Self-pay

## 2021-05-03 ENCOUNTER — Other Ambulatory Visit (HOSPITAL_COMMUNITY): Payer: Self-pay

## 2021-05-03 MED ORDER — FINASTERIDE 5 MG PO TABS
5.0000 mg | ORAL_TABLET | Freq: Every day | ORAL | 6 refills | Status: DC
  Filled 2021-05-03: qty 30, 30d supply, fill #0

## 2021-05-13 ENCOUNTER — Other Ambulatory Visit (HOSPITAL_COMMUNITY): Payer: Self-pay

## 2021-05-17 ENCOUNTER — Other Ambulatory Visit (HOSPITAL_COMMUNITY): Payer: Self-pay

## 2021-05-17 MED ORDER — FINASTERIDE 5 MG PO TABS
5.0000 mg | ORAL_TABLET | Freq: Every day | ORAL | 3 refills | Status: DC
  Filled 2021-05-17 – 2021-06-02 (×2): qty 90, 90d supply, fill #0
  Filled 2021-08-31: qty 90, 90d supply, fill #1

## 2021-05-27 ENCOUNTER — Other Ambulatory Visit (HOSPITAL_BASED_OUTPATIENT_CLINIC_OR_DEPARTMENT_OTHER): Payer: Self-pay

## 2021-05-27 MED ORDER — INFLUENZA VAC A&B SA ADJ QUAD 0.5 ML IM PRSY
PREFILLED_SYRINGE | INTRAMUSCULAR | 0 refills | Status: DC
  Filled 2021-05-27: qty 0.5, 1d supply, fill #0

## 2021-06-02 ENCOUNTER — Other Ambulatory Visit (HOSPITAL_BASED_OUTPATIENT_CLINIC_OR_DEPARTMENT_OTHER): Payer: Self-pay

## 2021-06-02 ENCOUNTER — Encounter: Payer: Self-pay | Admitting: Family Medicine

## 2021-06-02 ENCOUNTER — Other Ambulatory Visit (HOSPITAL_COMMUNITY): Payer: Self-pay

## 2021-06-02 DIAGNOSIS — L820 Inflamed seborrheic keratosis: Secondary | ICD-10-CM | POA: Diagnosis not present

## 2021-06-02 DIAGNOSIS — L570 Actinic keratosis: Secondary | ICD-10-CM | POA: Diagnosis not present

## 2021-06-11 DIAGNOSIS — M25572 Pain in left ankle and joints of left foot: Secondary | ICD-10-CM | POA: Diagnosis not present

## 2021-06-14 ENCOUNTER — Other Ambulatory Visit (HOSPITAL_COMMUNITY): Payer: Self-pay

## 2021-06-22 DIAGNOSIS — M7660 Achilles tendinitis, unspecified leg: Secondary | ICD-10-CM | POA: Insufficient documentation

## 2021-06-22 DIAGNOSIS — M67872 Other specified disorders of synovium, left ankle and foot: Secondary | ICD-10-CM | POA: Diagnosis not present

## 2021-06-22 DIAGNOSIS — M722 Plantar fascial fibromatosis: Secondary | ICD-10-CM | POA: Insufficient documentation

## 2021-06-29 DIAGNOSIS — M67879 Other specified disorders of synovium and tendon, unspecified ankle and foot: Secondary | ICD-10-CM | POA: Diagnosis not present

## 2021-08-26 ENCOUNTER — Other Ambulatory Visit: Payer: Self-pay

## 2021-08-26 ENCOUNTER — Ambulatory Visit (INDEPENDENT_AMBULATORY_CARE_PROVIDER_SITE_OTHER): Payer: Medicare Other

## 2021-08-26 ENCOUNTER — Other Ambulatory Visit: Payer: Medicare Other

## 2021-08-26 VITALS — BP 126/74 | HR 73 | Ht 71.0 in | Wt 190.0 lb

## 2021-08-26 DIAGNOSIS — Z0000 Encounter for general adult medical examination without abnormal findings: Secondary | ICD-10-CM | POA: Diagnosis not present

## 2021-08-26 DIAGNOSIS — Z91014 Allergy to mammalian meats: Secondary | ICD-10-CM

## 2021-08-26 NOTE — Progress Notes (Signed)
Subjective:   John Evans is a 81 y.o. male who presents for Medicare Annual/Subsequent preventive examination.  Review of Systems     Cardiac Risk Factors include: advanced age (>17men, >37 women);dyslipidemia;male gender;sedentary lifestyle  IN OFFICE VISIT AT [facility].    Objective:    Today's Vitals   08/26/21 0809  BP: 126/74  Pulse: 73  SpO2: 99%  Weight: 190 lb (86.2 kg)  Height: 5\' 11"  (1.803 m)   Body mass index is 26.5 kg/m.  Advanced Directives 08/26/2021 01/05/2021  Does Patient Have a Medical Advance Directive? Yes No  Type of Paramedic of [place];Living will -  Copy of [place] in Chart? No - copy requested -  Would patient like information on creating a medical advance directive? - No - Patient declined    Current Medications (verified) Outpatient Encounter Medications as of 08/26/2021  Medication Sig   allopurinol (ZYLOPRIM) 300 MG tablet TAKE 1 TABLET (300 MG TOTAL) BY MOUTH DAILY.   aspirin 81 MG tablet Take 81 mg by mouth daily. Every other day   Cholecalciferol (VITAMIN D3) 50 MCG (2000 UT) TABS Take by mouth.   Cyanocobalamin (VITAMIN B 12 PO) Take by mouth.   finasteride (PROSCAR) 5 MG tablet Take 1 tablet (5 mg total) by mouth daily with supper.   finasteride (PROSCAR) 5 MG tablet Take 1 tablet (5 mg total) by mouth daily.   finasteride (PROSCAR) 5 MG tablet Take 1 tablet (5 mg total) by mouth daily.   influenza vaccine adjuvanted (FLUAD) 0.5 ML injection Inject into the muscle.   molnupiravir EUA (LAGEVRIO) 200 MG CAPS capsule Lagevrio 200 mg capsule (EUA)   tretinoin (RETIN-A) 0.025 % cream APPLY ON THE SKIN AT BEDTIME; APPLY TO FACE   [DISCONTINUED] albuterol (VENTOLIN HFA) 108 (90 Base) MCG/ACT inhaler Inhale 2 puffs into the lungs every 6 (six) hours as needed for wheezing or shortness of breath.   [DISCONTINUED] benzonatate (TESSALON PERLES) 100 MG capsule Take 1 capsule (100 mg total) by mouth 3  (three) times daily as needed.   No facility-administered encounter medications on file as of 08/26/2021.    Allergies (verified) Latex   History: Past Medical History:  Diagnosis Date   BPH (benign prostatic hyperplasia)    Gout    Hyperlipidemia    Patient reportedly had a coronary calcium score of 21 last year I cannot find his report   Hypertension    Insomnia    Obstructive sleep apnea    Panic attacks    Past Surgical History:  Procedure Laterality Date   BACK SURGERY     EYE SURGERY     Family History  Problem Relation Age of Onset   Cancer Father        throat   Esophageal cancer Father    Diabetes Paternal Grandmother    Colon cancer Neg Hx    Social History   Socioeconomic History   Marital status: Married    Spouse name: John Evans   Number of children: 2   Years of education: Not on file   Highest education level: Not on file  Occupational History   Occupation: 2  Tobacco Use   Smoking status: Never   Smokeless tobacco: Never  Substance and Sexual Activity   Alcohol use: No   Drug use: No   Sexual activity: Yes    Comment: married, retired Software engineer  Other Topics Concern   Not on file  Social History Narrative  Married x 55 years.    4 grandchildren   Social Determinants of Radio broadcast assistant Strain: Low Risk    Difficulty of Paying Living Expenses: Not hard at all  Food Insecurity: No Food Insecurity   Worried About Charity fundraiser in the Last Year: Never true   Arboriculturist in the Last Year: Never true  Transportation Needs: No Transportation Needs   Lack of Transportation (Medical): No   Lack of Transportation (Non-Medical): No  Physical Activity: Inactive   Days of Exercise per Week: 0 days   Minutes of Exercise per Session: 0 min  Stress: No Stress Concern Present   Feeling of Stress : Not at all  Social Connections: Socially Integrated   Frequency of Communication with Friends and Family: More than three times a  week   Frequency of Social Gatherings with Friends and Family: More than three times a week   Attends Religious Services: More than 4 times per year   Active Member of [employer] or Organizations: Yes   Attends Music therapist: More than 4 times per year   Marital Status: Married    Tobacco Counseling Counseling given: Not Answered   Clinical Intake:  Pre-visit preparation completed: Yes  Pain : No/denies pain     BMI - recorded: 26.5 Nutritional Status: BMI 25 -29 Overweight Nutritional Risks: None Diabetes: No  How often do you need to have someone help you when you read instructions, pamphlets, or other written materials from your doctor or pharmacy?: 1 - Never  Diabetic?NO  Interpreter Needed?: No  Information entered by :: MJ Wilmore Holsomback, LPN   Activities of Daily Living In your present state of health, do you have any difficulty performing the following activities: 08/26/2021  Hearing? N  Vision? N  Difficulty concentrating or making decisions? N  Walking or climbing stairs? N  Dressing or bathing? N  Doing errands, shopping? N  Preparing Food and eating ? N  Using the Toilet? N  In the past six months, have you accidently leaked urine? N  Do you have problems with loss of bowel control? N  Managing your Medications? N  Managing your Finances? N  Housekeeping or managing your Housekeeping? N  Some recent data might be hidden    Patient Care Team: Susy Frizzle, MD as PCP - General (Family Medicine)  Indicate any recent Medical Services you may have received from other than [facility] providers in the past year (date may be approximate).     Assessment:   This is a routine wellness examination for John Evans.  Hearing/Vision screen Hearing Screening - Comments:: No hearing issues. Vision Screening - Comments:: Readers. Dr. Katy Fitch. 2022.  Dietary issues and exercise activities discussed: Current Exercise Habits: The patient does not participate in  regular exercise at present, Exercise limited by: cardiac condition(s)   Goals Addressed             This Visit's Progress    Exercise 3x per week (30 min per time)       Increase as tolerated.        Depression Screen PHQ 2/9 Scores 08/26/2021 11/05/2019 07/05/2018 01/03/2017 12/29/2015 11/07/2014  PHQ - 2 Score 0 0 1 0 0 0  PHQ- 9 Score - - - 1 - -    Fall Risk Fall Risk  08/26/2021 11/05/2019 07/05/2018 01/03/2017 12/29/2015  Falls in the past year? 0 0 0 No No  Number falls in past yr: 0 - - - -  Injury with Fall? 0 - - - -  Risk for fall due to : No Fall Risks - - - -  Follow up Falls prevention discussed Falls evaluation completed Falls evaluation completed - -    FALL RISK PREVENTION PERTAINING TO THE HOME:  Any stairs in or around the home? Yes  If so, are there any without handrails? No  Home free of loose throw rugs in walkways, pet beds, electrical cords, etc? Yes  Adequate lighting in your home to reduce risk of falls? Yes   ASSISTIVE DEVICES UTILIZED TO PREVENT FALLS:  Life alert? No  Use of a cane, walker or w/c? No  Grab bars in the bathroom? No  Shower chair or bench in shower? No  Elevated toilet seat or a handicapped toilet? No   TIMED UP AND GO:  Was the test performed? Yes .  Length of time to ambulate 10 feet: 10 sec.   Gait steady and fast without use of assistive device  Cognitive Function:     6CIT Screen 08/26/2021  What Year? 0 points  What month? 0 points  What time? 0 points  Count back from 20 0 points  Months in reverse 0 points  Repeat phrase 2 points  Total Score 2    Immunizations Immunization History  Administered Date(s) Administered   Influenza, High Dose Seasonal PF 06/01/2018   Influenza-Unspecified 05/29/2021   Moderna SARS-COV2 Booster Vaccination 07/23/2020   Moderna Sars-Covid-2 Vaccination 08/07/2019, 09/04/2019   Pneumococcal Conjugate-13 09/05/2013   Pneumococcal Polysaccharide-23 05/15/2002, 01/03/2017   Td  08/15/1978    TDAP status: Up to date  Flu Vaccine status: Up to date  Pneumococcal vaccine status: Up to date  Covid-19 vaccine status: Completed vaccines  Qualifies for Shingles Vaccine? Yes   Zostavax completed Yes   Shingrix Completed?: No.    Education has been provided regarding the importance of this vaccine. Patient has been advised to call insurance company to determine out of pocket expense if they have not yet received this vaccine. Advised may also receive vaccine at local pharmacy or Health Dept. Verbalized acceptance and understanding.  Screening Tests Health Maintenance  Topic Date Due   Zoster Vaccines- Shingrix (1 of 2) Never done   COVID-19 Vaccine (3 - Booster for Moderna series) 09/17/2020   TETANUS/TDAP  10/22/2021 (Originally 08/[birth year]/1988)   Pneumonia Vaccine 81+ Years old  Completed   INFLUENZA VACCINE  Completed   HPV VACCINES  Aged Out    Health Maintenance  Health Maintenance Due  Topic Date Due   Zoster Vaccines- Shingrix (1 of 2) Never done   COVID-19 Vaccine (3 - Booster for Moderna series) 09/17/2020    Colorectal cancer screening: Type of screening: Colonoscopy. Completed 05/16/2016. Repeat every NO LONGER REQUIRED years  Lung Cancer Screening: (Low Dose CT Chest recommended if Age 72-80 years, 30 pack-year currently smoking OR have quit w/in 15years.) does not qualify.    Additional Screening:  Hepatitis C Screening: does not qualify.  Vision Screening: Recommended annual ophthalmology exams for early detection of glaucoma and other disorders of the eye. Is the patient up to date with their annual eye exam?  Yes  Who is the provider or what is the name of the office in which the patient attends annual eye exams? Dr. Katy Fitch If pt is not established with a provider, would they like to be referred to a provider to establish care? No .   Dental Screening: Recommended annual dental exams for proper oral hygiene  Community Resource Referral /  Chronic Care Management: CRR required this visit?  No   CCM required this visit?  No      Plan:     I have personally reviewed and noted the following in the patients chart:   Medical and social history Use of alcohol, tobacco or illicit drugs  Current medications and supplements including opioid prescriptions. Patient is not currently taking opioid prescriptions. Functional ability and status Nutritional status Physical activity Advanced directives List of other physicians Hospitalizations, surgeries, and ER visits in previous 12 months Vitals Screenings to include cognitive, depression, and falls Referrals and appointments  In addition, I have reviewed and discussed with patient certain preventive protocols, quality metrics, and best practice recommendations. A written personalized care plan for preventive services as well as general preventive health recommendations were provided to patient.     Chriss Driver, LPN   2/48/1859   Nurse Notes: Pt is up to date on all age appropriate health maintenance. Discussed shingrix and how to obtain. Pt states he has had a Tdap within the last 10 years.

## 2021-08-26 NOTE — Patient Instructions (Signed)
Mr. Mccollam , Thank you for taking time to come for your Medicare Wellness Visit. I appreciate your ongoing commitment to your health goals. Please review the following plan we discussed and let me know if I can assist you in the future.   Screening recommendations/referrals: Colonoscopy: Done 05/16/2016  Recommended yearly ophthalmology/optometry visit for glaucoma screening and checkup Recommended yearly dental visit for hygiene and checkup  Vaccinations: Influenza vaccine: Done 05/29/2021 Repeat annually  Pneumococcal vaccine: Done 09/05/2013 and 01/03/2017 Tdap vaccine: Done within the last 10 years. Shingles vaccine: Shingrix discussed. Please contact your pharmacy for coverage information.     Covid-19: Done 08/07/2019, 08/07/2020 and 09/03/2020  Advanced directives: Please bring a copy of your health care power of attorney and living will to the office to be added to your chart at your convenience.   Conditions/risks identified: Aim for 30 minutes of exercise or brisk walking each day, drink 6-8 glasses of water and eat lots of fruits and vegetables.   Next appointment: Follow up in one year for your annual wellness visit. 2024.  Preventive Care 82 Years and Older, Male  Preventive care refers to lifestyle choices and visits with your health care provider that can promote health and wellness. What does preventive care include? A yearly physical exam. This is also called an annual well check. Dental exams once or twice a year. Routine eye exams. Ask your health care provider how often you should have your eyes checked. Personal lifestyle choices, including: Daily care of your teeth and gums. Regular physical activity. Eating a healthy diet. Avoiding tobacco and drug use. Limiting alcohol use. Practicing safe sex. Taking low doses of aspirin every day. Taking vitamin and mineral supplements as recommended by your health care provider. What happens during an annual well  check? The services and screenings done by your health care provider during your annual well check will depend on your age, overall health, lifestyle risk factors, and family history of disease. Counseling  Your health care provider may ask you questions about your: Alcohol use. Tobacco use. Drug use. Emotional well-being. Home and relationship well-being. Sexual activity. Eating habits. History of falls. Memory and ability to understand (cognition). Work and work Statistician. Screening  You may have the following tests or measurements: Height, weight, and BMI. Blood pressure. Lipid and cholesterol levels. These may be checked every 5 years, or more frequently if you are over 40 years old. Skin check. Lung cancer screening. You may have this screening every year starting at age 82 if you have a 30-pack-year history of smoking and currently smoke or have quit within the past 15 years. Fecal occult blood test (FOBT) of the stool. You may have this test every year starting at age 82. Flexible sigmoidoscopy or colonoscopy. You may have a sigmoidoscopy every 5 years or a colonoscopy every 10 years starting at age 82. Prostate cancer screening. Recommendations will vary depending on your family history and other risks. Hepatitis C blood test. Hepatitis B blood test. Sexually transmitted disease (STD) testing. Diabetes screening. This is done by checking your blood sugar (glucose) after you have not eaten for a while (fasting). You may have this done every 1-3 years. Abdominal aortic aneurysm (AAA) screening. You may need this if you are a current or former smoker. Osteoporosis. You may be screened starting at age 82 if you are at high risk. Talk with your health care provider about your test results, treatment options, and if necessary, the need for more tests. Vaccines  Your health care provider may recommend certain vaccines, such as: Influenza vaccine. This is recommended every  year. Tetanus, diphtheria, and acellular pertussis (Tdap, Td) vaccine. You may need a Td booster every 10 years. Zoster vaccine. You may need this after age 82. Pneumococcal 13-valent conjugate (PCV13) vaccine. One dose is recommended after age 82. Pneumococcal polysaccharide (PPSV23) vaccine. One dose is recommended after age 82. Talk to your health care provider about which screenings and vaccines you need and how often you need them. This information is not intended to replace advice given to you by your health care provider. Make sure you discuss any questions you have with your health care provider. Document Released: 08/28/2015 Document Revised: 04/20/2016 Document Reviewed: 06/02/2015 Elsevier Interactive Patient Education  2017 [place] Prevention in the Home Falls can cause injuries. They can happen to people of all ages. There are many things you can do to make your home safe and to help prevent falls. What can I do on the outside of my home? Regularly fix the edges of walkways and driveways and fix any cracks. Remove anything that might make you trip as you walk through a door, such as a raised step or threshold. Trim any bushes or trees on the path to your home. Use bright outdoor lighting. Clear any walking paths of anything that might make someone trip, such as rocks or tools. Regularly check to see if handrails are loose or broken. Make sure that both sides of any steps have handrails. Any raised decks and porches should have guardrails on the edges. Have any leaves, snow, or ice cleared regularly. Use sand or salt on walking paths during winter. Clean up any spills in your garage right away. This includes oil or grease spills. What can I do in the bathroom? Use night lights. Install grab bars by the toilet and in the tub and shower. Do not use towel bars as grab bars. Use non-skid mats or decals in the tub or shower. If you need to sit down in the shower, use a  plastic, non-slip stool. Keep the floor dry. Clean up any water that spills on the floor as soon as it happens. Remove soap buildup in the tub or shower regularly. Attach bath mats securely with double-sided non-slip rug tape. Do not have throw rugs and other things on the floor that can make you trip. What can I do in the bedroom? Use night lights. Make sure that you have a light by your bed that is easy to reach. Do not use any sheets or blankets that are too big for your bed. They should not hang down onto the floor. Have a firm chair that has side arms. You can use this for support while you get dressed. Do not have throw rugs and other things on the floor that can make you trip. What can I do in the kitchen? Clean up any spills right away. Avoid walking on wet floors. Keep items that you use a lot in easy-to-reach places. If you need to reach something above you, use a strong step stool that has a grab bar. Keep electrical cords out of the way. Do not use floor polish or wax that makes floors slippery. If you must use wax, use non-skid floor wax. Do not have throw rugs and other things on the floor that can make you trip. What can I do with my stairs? Do not leave any items on the stairs. Make sure that there are  handrails on both sides of the stairs and use them. Fix handrails that are broken or loose. Make sure that handrails are as long as the stairways. Check any carpeting to make sure that it is firmly attached to the stairs. Fix any carpet that is loose or worn. Avoid having throw rugs at the top or bottom of the stairs. If you do have throw rugs, attach them to the floor with carpet tape. Make sure that you have a light switch at the top of the stairs and the bottom of the stairs. If you do not have them, ask someone to add them for you. What else can I do to help prevent falls? Wear shoes that: Do not have high heels. Have rubber bottoms. Are comfortable and fit you  well. Are closed at the toe. Do not wear sandals. If you use a stepladder: Make sure that it is fully opened. Do not climb a closed stepladder. Make sure that both sides of the stepladder are locked into place. Ask someone to hold it for you, if possible. Clearly mark and make sure that you can see: Any grab bars or handrails. First and last steps. Where the edge of each step is. Use tools that help you move around (mobility aids) if they are needed. These include: Canes. Walkers. Scooters. Crutches. Turn on the lights when you go into a dark area. Replace any light bulbs as soon as they burn out. Set up your furniture so you have a clear path. Avoid moving your furniture around. If any of your floors are uneven, fix them. If there are any pets around you, be aware of where they are. Review your medicines with your doctor. Some medicines can make you feel dizzy. This can increase your chance of falling. Ask your doctor what other things that you can do to help prevent falls. This information is not intended to replace advice given to you by your health care provider. Make sure you discuss any questions you have with your health care provider. Document Released: 05/28/2009 Document Revised: 01/07/2016 Document Reviewed: 09/05/2014 Elsevier Interactive Patient Education  2017 [employer].

## 2021-08-30 LAB — ALPHA-GAL PANEL
Beef IgE: <0.1 kU/L (ref <0.35)
Class: 0
Class: 0
Class: 0
Galactose-alpha-1,3-galactose IgE: 0.29 kU/L — ABNORMAL HIGH (ref <0.10)
LAMB/MUTTON IGE: <0.1 kU/L (ref <0.35)
Pork IgE: <0.1 kU/L (ref <0.35)

## 2021-08-31 ENCOUNTER — Telehealth: Payer: Self-pay

## 2021-08-31 ENCOUNTER — Other Ambulatory Visit (HOSPITAL_COMMUNITY): Payer: Self-pay

## 2021-08-31 NOTE — Telephone Encounter (Signed)
Left message for patient to call back regarding results.

## 2021-08-31 NOTE — Telephone Encounter (Signed)
-----   Message from Susy Frizzle, MD sent at 08/31/2021  6:35 AM EST ----- Alpha gal panel is slightly positive.

## 2021-10-05 DIAGNOSIS — Z961 Presence of intraocular lens: Secondary | ICD-10-CM | POA: Diagnosis not present

## 2021-10-05 DIAGNOSIS — H401131 Primary open-angle glaucoma, bilateral, mild stage: Secondary | ICD-10-CM | POA: Diagnosis not present

## 2021-10-05 DIAGNOSIS — H2511 Age-related nuclear cataract, right eye: Secondary | ICD-10-CM | POA: Diagnosis not present

## 2021-10-05 DIAGNOSIS — H35352 Cystoid macular degeneration, left eye: Secondary | ICD-10-CM | POA: Diagnosis not present

## 2021-10-22 ENCOUNTER — Other Ambulatory Visit (HOSPITAL_COMMUNITY): Payer: Self-pay

## 2021-10-22 ENCOUNTER — Ambulatory Visit (INDEPENDENT_AMBULATORY_CARE_PROVIDER_SITE_OTHER): Payer: Medicare Other | Admitting: Family Medicine

## 2021-10-22 ENCOUNTER — Other Ambulatory Visit: Payer: Self-pay

## 2021-10-22 ENCOUNTER — Encounter: Payer: Self-pay | Admitting: Family Medicine

## 2021-10-22 VITALS — BP 142/88 | HR 79 | Temp 97.2°F | Resp 18 | Ht 71.0 in | Wt 190.0 lb

## 2021-10-22 DIAGNOSIS — I10 Essential (primary) hypertension: Secondary | ICD-10-CM

## 2021-10-22 MED ORDER — METOPROLOL TARTRATE 25 MG PO TABS
25.0000 mg | ORAL_TABLET | Freq: Two times a day (BID) | ORAL | 3 refills | Status: DC | PRN
  Filled 2021-10-22: qty 60, 30d supply, fill #0

## 2021-10-22 MED ORDER — ALPRAZOLAM 0.5 MG PO TABS
0.5000 mg | ORAL_TABLET | Freq: Every evening | ORAL | 0 refills | Status: DC | PRN
  Filled 2021-10-22: qty 30, 30d supply, fill #0

## 2021-10-22 NOTE — Progress Notes (Signed)
? ?Subjective:  ? ? Patient ID: John Evans, male    DOB: 1941/01/16, 81 y.o.   MRN: [id number] ? ?Hypertension ?Associated symptoms include chest pain and headaches.  ?Headache  ?His past medical history is significant for hypertension.  ?Chest Pain  ?Associated symptoms include headaches.  ?His past medical history is significant for hypertension.  ? ?Patient is a very pleasant 81 year old Caucasian gentleman who presents today with fluctuating blood pressures.  Unfortunately, his wife is battling lymphoma in order.  She underwent surgery and after surgery she was suffered from short-term amnesia.  She has been battling memory loss.  As result, the patient has been under tremendous stress.  He is very worried about his wife.  He admits that he is not sleeping well.  He states that he cannot turn his mind off at night.  When he wakes up in the morning, he has recently been waking up with a very bad headache.  He started checking his blood pressure and has been seeing extremely high numbers.  He has had systolic blood pressures in the 180-190 range.  He brought his blood pressure cuff in today and it does not appear to be accurate.  His blood pressure cuff got a systolic blood pressure of 170 however my nurse got his blood pressure 142 and I got a systolic blood pressure of 140.  Therefore I question the validity of this because of.  However later in the day, the patient states that his blood pressure is dropping.  His blood pressure is typically 120/80 by the mid afternoon.  Otherwise he is doing well ?Past Medical History:  ?Diagnosis Date  ? BPH (benign prostatic hyperplasia)   ? Gout   ? Hyperlipidemia   ? Patient reportedly had a coronary calcium score of 21 last year I cannot find his report  ? Hypertension   ? Insomnia   ? Obstructive sleep apnea   ? Panic attacks   ? ?Past Surgical History:  ?Procedure Laterality Date  ? BACK SURGERY    ? EYE SURGERY    ? ?Current Outpatient Medications on File Prior to  Visit  ?Medication Sig Dispense Refill  ? aspirin 81 MG tablet Take 81 mg by mouth daily. Every other day    ? Cholecalciferol (VITAMIN D3) 50 MCG (2000 UT) TABS Take by mouth.    ? Cyanocobalamin (VITAMIN B 12 PO) Take by mouth.    ? finasteride (PROSCAR) 5 MG tablet Take 1 tablet (5 mg total) by mouth daily with supper.    ? tretinoin (RETIN-A) 0.025 % cream APPLY ON THE SKIN AT BEDTIME; APPLY TO FACE 45 g 2  ? ?No current facility-administered medications on file prior to visit.  ? ?Allergies  ?Allergen Reactions  ? Latex Rash  ? ?Social History  ? ?Socioeconomic History  ? Marital status: Married  ?  Spouse name: Remo Lipps  ? Number of children: 2  ? Years of education: Not on file  ? Highest education level: Not on file  ?Occupational History  ? Occupation: 2  ?Tobacco Use  ? Smoking status: Never  ? Smokeless tobacco: Never  ?Substance and Sexual Activity  ? Alcohol use: No  ? Drug use: No  ? Sexual activity: Yes  ?  Comment: married, retired Software engineer  ?Other Topics Concern  ? Not on file  ?Social History Narrative  ? Married x 55 years.   ? 4 grandchildren  ? ?Social Determinants of Health  ? ?Financial Resource Strain: Low Risk   ?  Difficulty of Paying Living Expenses: Not hard at all  ?Food Insecurity: No Food Insecurity  ? Worried About Charity fundraiser in the Last Year: Never true  ? Ran Out of Food in the Last Year: Never true  ?Transportation Needs: No Transportation Needs  ? Lack of Transportation (Medical): No  ? Lack of Transportation (Non-Medical): No  ?Physical Activity: Inactive  ? Days of Exercise per Week: 0 days  ? Minutes of Exercise per Session: 0 min  ?Stress: No Stress Concern Present  ? Feeling of Stress : Not at all  ?Social Connections: Socially Integrated  ? Frequency of Communication with Friends and Family: More than three times a week  ? Frequency of Social Gatherings with Friends and Family: More than three times a week  ? Attends Religious Services: More than 4 times per year  ?  Active Member of Clubs or Organizations: Yes  ? Attends Archivist Meetings: More than 4 times per year  ? Marital Status: Married  ?Intimate Partner Violence: Not At Risk  ? Fear of Current or Ex-Partner: No  ? Emotionally Abused: No  ? Physically Abused: No  ? Sexually Abused: No  ? ? ? ?Review of Systems  ?Cardiovascular:  Positive for chest pain.  ?Neurological:  Positive for headaches.  ?All other systems reviewed and are negative. ? ?   ?Objective:  ? Physical Exam ?Vitals reviewed.  ?Constitutional:   ?   Appearance: He is well-developed.  ?Cardiovascular:  ?   Heart sounds: Normal heart sounds. No murmur heard. ?  No friction rub. No gallop.  ?Pulmonary:  ?   Effort: Pulmonary effort is normal. No respiratory distress.  ?   Breath sounds: Normal breath sounds. No stridor. No wheezing, rhonchi or rales.  ?Abdominal:  ?   General: Bowel sounds are normal.  ?   Palpations: Abdomen is soft.  ?Neurological:  ?   Mental Status: He is alert.  ? ? ? ? ? ?   ?Assessment & Plan:  ?Benign essential HTN - Plan: metoprolol tartrate (LOPRESSOR) 25 MG tablet, ALPRAZolam (XANAX) 0.5 MG tablet, CBC with Differential/Platelet, BASIC METABOLIC PANEL WITH GFR ?First I question the accuracy of his blood pressure cuff.  Second I believe that his blood pressure is high in the morning probably because of his anxiety, not sleeping well.  Therefore I have asked the patient to get a new blood pressure cuff so that he can trust the values.  I want him to try Xanax 0.5 mg p.o. nightly to see if he can sleep better and relax and if this will help improve his blood pressure in the morning.  If he sees blood pressures greater than 160/100, I gave him metoprolol 25 mg tablets.  He can take 1 every 12 hours as needed.  I chose a beta-blocker due to his "antiadrenaline like" properties as I believe some of this is truly anxiety related.  If his blood pressure trends down over the next week or so and we can avoid antihypertensive  medication we will.  However if he continues to see high blood pressures in the morning, I want him to try Lopressor 25 mg p.o. nightly so that the medication is in his system first thing in the morning when his blood pressure is spiking.  He can skip the afternoon dose given the fact that his afternoon blood pressures are excellent. ? ?

## 2021-10-23 LAB — CBC WITH DIFFERENTIAL/PLATELET
Absolute Monocytes: 755 cells/uL (ref 200–950)
Basophils Absolute: 70 cells/uL (ref 0–200)
Basophils Relative: 1.1 %
Eosinophils Absolute: 160 cells/uL (ref 15–500)
Eosinophils Relative: 2.5 %
HCT: 48.5 % (ref 38.5–50.0)
Hemoglobin: 17.1 g/dL (ref 13.2–17.1)
Lymphs Abs: 1792 cells/uL (ref 850–3900)
MCH: 31.7 pg (ref 27.0–33.0)
MCHC: 35.3 g/dL (ref 32.0–36.0)
MCV: 90 fL (ref 80.0–100.0)
MPV: 10.1 fL (ref 7.5–12.5)
Monocytes Relative: 11.8 %
Neutro Abs: 3622 cells/uL (ref 1500–7800)
Neutrophils Relative %: 56.6 %
Platelets: 199 10*3/uL (ref 140–400)
RBC: 5.39 10*6/uL (ref 4.20–5.80)
RDW: 12.7 % (ref 11.0–15.0)
Total Lymphocyte: 28 %
WBC: 6.4 10*3/uL (ref 3.8–10.8)

## 2021-10-23 LAB — BASIC METABOLIC PANEL WITH GFR
BUN: 15 mg/dL (ref 7–25)
CO2: 28 mmol/L (ref 20–32)
Calcium: 9.5 mg/dL (ref 8.6–10.3)
Chloride: 105 mmol/L (ref 98–110)
Creat: 0.88 mg/dL (ref 0.70–1.22)
Glucose, Bld: 94 mg/dL (ref 65–99)
Potassium: 4 mmol/L (ref 3.5–5.3)
Sodium: 141 mmol/L (ref 135–146)
eGFR: 86 mL/min/{1.73_m2} (ref >=60)

## 2021-11-16 DIAGNOSIS — Z85828 Personal history of other malignant neoplasm of skin: Secondary | ICD-10-CM | POA: Diagnosis not present

## 2021-11-16 DIAGNOSIS — L821 Other seborrheic keratosis: Secondary | ICD-10-CM | POA: Diagnosis not present

## 2021-11-16 DIAGNOSIS — L578 Other skin changes due to chronic exposure to nonionizing radiation: Secondary | ICD-10-CM | POA: Diagnosis not present

## 2021-11-16 DIAGNOSIS — D1801 Hemangioma of skin and subcutaneous tissue: Secondary | ICD-10-CM | POA: Diagnosis not present

## 2021-11-22 ENCOUNTER — Emergency Department (HOSPITAL_COMMUNITY)
Admission: EM | Admit: 2021-11-22 | Discharge: 2021-11-22 | Disposition: A | Discharge status: Home / Self Care | Payer: Medicare Other | Attending: Emergency Medicine | Admitting: Emergency Medicine

## 2021-11-22 ENCOUNTER — Other Ambulatory Visit (HOSPITAL_COMMUNITY): Payer: Self-pay

## 2021-11-22 ENCOUNTER — Encounter (HOSPITAL_COMMUNITY): Payer: Self-pay | Admitting: Emergency Medicine

## 2021-11-22 ENCOUNTER — Emergency Department (HOSPITAL_COMMUNITY): Payer: Medicare Other

## 2021-11-22 DIAGNOSIS — N23 Unspecified renal colic: Secondary | ICD-10-CM

## 2021-11-22 DIAGNOSIS — D72829 Elevated white blood cell count, unspecified: Secondary | ICD-10-CM | POA: Insufficient documentation

## 2021-11-22 DIAGNOSIS — N134 Hydroureter: Secondary | ICD-10-CM | POA: Diagnosis not present

## 2021-11-22 DIAGNOSIS — Z7982 Long term (current) use of aspirin: Secondary | ICD-10-CM | POA: Insufficient documentation

## 2021-11-22 DIAGNOSIS — Z91040 Latex allergy status: Secondary | ICD-10-CM | POA: Diagnosis not present

## 2021-11-22 DIAGNOSIS — I700 Atherosclerosis of aorta: Secondary | ICD-10-CM | POA: Diagnosis not present

## 2021-11-22 DIAGNOSIS — R109 Unspecified abdominal pain: Secondary | ICD-10-CM | POA: Diagnosis not present

## 2021-11-22 DIAGNOSIS — Z87442 Personal history of urinary calculi: Secondary | ICD-10-CM | POA: Insufficient documentation

## 2021-11-22 DIAGNOSIS — N132 Hydronephrosis with renal and ureteral calculous obstruction: Secondary | ICD-10-CM | POA: Insufficient documentation

## 2021-11-22 DIAGNOSIS — R9431 Abnormal electrocardiogram [ECG] [EKG]: Secondary | ICD-10-CM | POA: Diagnosis not present

## 2021-11-22 LAB — URINALYSIS, ROUTINE W REFLEX MICROSCOPIC
Bilirubin Urine: NEGATIVE
Glucose, UA: NEGATIVE mg/dL
Ketones, ur: NEGATIVE mg/dL
Leukocytes,Ua: NEGATIVE
Nitrite: NEGATIVE
Protein, ur: NEGATIVE mg/dL
Specific Gravity, Urine: >1.046 — ABNORMAL HIGH (ref 1.005–1.030)
pH: 6 (ref 5.0–8.0)

## 2021-11-22 LAB — COMPREHENSIVE METABOLIC PANEL
ALT: 18 U/L (ref 0–44)
AST: 22 U/L (ref 15–41)
Albumin: 4 g/dL (ref 3.5–5.0)
Alkaline Phosphatase: 74 U/L (ref 38–126)
Anion gap: 9 (ref 5–15)
BUN: 19 mg/dL (ref 8–23)
CO2: 24 mmol/L (ref 22–32)
Calcium: 9.6 mg/dL (ref 8.9–10.3)
Chloride: 104 mmol/L (ref 98–111)
Creatinine, Ser: 1.17 mg/dL (ref 0.61–1.24)
GFR, Estimated: >60 mL/min (ref >60)
Glucose, Bld: 145 mg/dL — ABNORMAL HIGH (ref 70–99)
Potassium: 3.9 mmol/L (ref 3.5–5.1)
Sodium: 137 mmol/L (ref 135–145)
Total Bilirubin: 0.8 mg/dL (ref 0.3–1.2)
Total Protein: 6.8 g/dL (ref 6.5–8.1)

## 2021-11-22 LAB — CBC WITH DIFFERENTIAL/PLATELET
Abs Immature Granulocytes: 0.04 10*3/uL (ref 0.00–0.07)
Basophils Absolute: 0.1 10*3/uL (ref 0.0–0.1)
Basophils Relative: 1 %
Eosinophils Absolute: 0 10*3/uL (ref 0.0–0.5)
Eosinophils Relative: 0 %
HCT: 44.1 % (ref 39.0–52.0)
Hemoglobin: 16 g/dL (ref 13.0–17.0)
Immature Granulocytes: 0 %
Lymphocytes Relative: 8 %
Lymphs Abs: 0.9 10*3/uL (ref 0.7–4.0)
MCH: 32.6 pg (ref 26.0–34.0)
MCHC: 36.3 g/dL — ABNORMAL HIGH (ref 30.0–36.0)
MCV: 89.8 fL (ref 80.0–100.0)
Monocytes Absolute: 0.8 10*3/uL (ref 0.1–1.0)
Monocytes Relative: 7 %
Neutro Abs: 9.9 10*3/uL — ABNORMAL HIGH (ref 1.7–7.7)
Neutrophils Relative %: 84 %
Platelets: 166 10*3/uL (ref 150–400)
RBC: 4.91 MIL/uL (ref 4.22–5.81)
RDW: 12.1 % (ref 11.5–15.5)
WBC: 11.8 10*3/uL — ABNORMAL HIGH (ref 4.0–10.5)
nRBC: 0 % (ref 0.0–0.2)

## 2021-11-22 LAB — LACTIC ACID, PLASMA
Lactic Acid, Venous: 3.3 mmol/L (ref 0.5–1.9)
Lactic Acid, Venous: 2.4 mmol/L (ref 0.5–1.9)

## 2021-11-22 LAB — LIPASE, BLOOD: Lipase: 33 U/L (ref 11–51)

## 2021-11-22 MED ORDER — OXYCODONE HCL 5 MG PO TABS
2.5000 mg | ORAL_TABLET | Freq: Four times a day (QID) | ORAL | 0 refills | Status: DC | PRN
Start: 2021-11-22 — End: 2022-08-18
  Filled 2021-11-22: qty 6, 2d supply, fill #0

## 2021-11-22 MED ORDER — TAMSULOSIN HCL 0.4 MG PO CAPS
0.4000 mg | ORAL_CAPSULE | Freq: Every day | ORAL | 0 refills | Status: DC
  Filled 2021-11-22: qty 7, 7d supply, fill #0

## 2021-11-22 MED ORDER — SODIUM CHLORIDE 0.9 % IV BOLUS
500.0000 mL | Freq: Once | INTRAVENOUS | Status: AC
  Administered 2021-11-22: 500 mL via INTRAVENOUS

## 2021-11-22 MED ORDER — IOHEXOL 300 MG/ML  SOLN
100.0000 mL | Freq: Once | INTRAMUSCULAR | Status: AC | PRN
  Administered 2021-11-22: 100 mL via INTRAVENOUS

## 2021-11-22 MED ORDER — MORPHINE SULFATE (PF) 4 MG/ML IV SOLN
4.0000 mg | Freq: Once | INTRAVENOUS | Status: AC
  Administered 2021-11-22: 4 mg via INTRAVENOUS
  Filled 2021-11-22: qty 1

## 2021-11-22 MED ORDER — KETOROLAC TROMETHAMINE 15 MG/ML IJ SOLN
15.0000 mg | Freq: Once | INTRAMUSCULAR | Status: AC
  Administered 2021-11-22: 15 mg via INTRAVENOUS
  Filled 2021-11-22: qty 1

## 2021-11-22 MED ORDER — OXYCODONE HCL 5 MG PO TABS
5.0000 mg | ORAL_TABLET | Freq: Once | ORAL | Status: AC
  Administered 2021-11-22: 5 mg via ORAL
  Filled 2021-11-22: qty 1

## 2021-11-22 MED ORDER — ONDANSETRON 4 MG PO TBDP
4.0000 mg | ORAL_TABLET | Freq: Three times a day (TID) | ORAL | 0 refills | Status: DC | PRN
  Filled 2021-11-22: qty 10, 4d supply, fill #0

## 2021-11-22 MED ORDER — ONDANSETRON HCL 4 MG/2ML IJ SOLN
4.0000 mg | Freq: Once | INTRAMUSCULAR | Status: AC
  Administered 2021-11-22: 4 mg via INTRAVENOUS
  Filled 2021-11-22: qty 2

## 2021-11-22 MED ORDER — SODIUM CHLORIDE (PF) 0.9 % IJ SOLN
INTRAMUSCULAR | Status: AC
Start: 2021-11-22 — End: 2021-11-22
  Filled 2021-11-22: qty 50

## 2021-11-22 NOTE — ED Triage Notes (Signed)
Pt reports L flank pain that started around 6p last night and has gotten progressively worse. Denies dysuria. Reports 1 episode of vomiting. Hx of alpha gal and latex allergies.  ?

## 2021-11-22 NOTE — ED Provider Notes (Signed)
?  Face-to-face evaluation ? ? ?History: Presents for evaluation of flank pain which started yesterday and worsened at 3 AM so he ultimately came here for further evaluation.  He is using Aleve without relief.  History of same. ? ?Physical exam: Elderly alert and cooperative.  Nontoxic appearance.  He is in no distress. ? ?MDM: Evaluation for  ?Chief Complaint  ?Patient presents with  ? Flank Pain  ?  ? ?Distal left ureteral stone, with mild obstructive pattern on imaging.  Patient improved after treatment.  No evidence for UTI.  Unusual lactate elevation, nonspecific.  No evidence for sepsis on other parameters. ? ?Medical screening examination/treatment/procedure(s) were conducted as a shared visit with non-physician practitioner(s) and myself.  I personally evaluated the patient during the encounter ? ?  ?Daleen Bo, MD ?11/22/21 1719 ? ?

## 2021-11-22 NOTE — Discharge Instructions (Signed)
Please read and follow all provided instructions. ? ?Your diagnoses today include:  ?1. Ureteral colic   ? ? ?Tests performed today include: ?Urine test that showed no infection ?CT scan which showed a 4 millimeter kidney stone on the left side ?Blood test that showed normal kidney function ?Vital signs. See below for your results today.  ? ?Medications prescribed:  ?Oxycodone - narcotic pain medication ? ?DO NOT drive or perform any activities that require you to be awake and alert because this medicine can make you drowsy.  ? ?Use pain medication only under direct supervision at the lowest possible dose needed to control your pain.  ? ?Zofran (ondansetron) - for nausea and vomiting ? ?Flomax (tamsulosin) - relaxes smooth muscle to help kidney stones pass ? ?Take any prescribed medications only as directed. ? ?Home care instructions:  ?Follow any educational materials contained in this packet. ? ?Please double your fluid intake for the next several days. Strain your urine and save any stones that may pass.  ? ?BE VERY CAREFUL not to take multiple medicines containing Tylenol (also called acetaminophen). Doing so can lead to an overdose which can damage your liver and cause liver failure and possibly death.  ? ?Follow-up instructions: ?Please follow-up with your urologist or the urologist referral (provided on front page) in the next 1 week for further evaluation of your symptoms. ? ?Return instructions:  ?If you need to return to the Emergency Department, go to [facility] and not [facility]. The urologists are located at [facility] and can better care for you at this location. ? ?Please return to the Emergency Department if you experience worsening symptoms.  ?Please return if you develop fever or uncontrolled pain or vomiting. ?Please return if you have any other emergent concerns. ? ?Additional Information: ? ?Your vital signs today were: ?BP 123/84   Pulse 71   Temp 98 ?F (36.7 ?C)   Resp  12   SpO2 97%  ?If your blood pressure (BP) was elevated above 135/85 this visit, please have this repeated by your doctor within one month. ?-------------- ? ?

## 2021-11-22 NOTE — ED Provider Notes (Signed)
?[facility] DEPT ?Provider Note ? ? ?CSN: [id number] ?Arrival date & time: 11/22/21  3151 ? ?  ? ?History ? ?Chief Complaint  ?Patient presents with  ? Flank Pain  ? ? ?John Evans is a 81 y.o. male. ? ?Patient with remote history of kidney stones presents the emergency department for evaluation of left-sided abdominal and flank pain starting about 12 hours ago.  Pain started mild and has gradually worsened.  He has had nausea with one episode of near vomiting.  He had a bowel movement yesterday, nonbloody and no diarrhea.  No urinary symptoms including hematuria, dysuria.  No chest pain or shortness of breath.  Never had a history of diverticulitis.  No falls or injuries.  He did take an Aleve prior to arrival without improvement.  He does not take NSAIDs regularly. ? ? ?  ? ?Home Medications ?Prior to Admission medications   ?Medication Sig Start Date End Date Taking? Authorizing Provider  ?ALPRAZolam (XANAX) 0.5 MG tablet Take 1 tablet (0.5 mg total) by mouth at bedtime as needed. 10/22/21   Susy Frizzle, MD  ?aspirin 81 MG tablet Take 81 mg by mouth daily. Every other day    [provider]  ?Cholecalciferol (VITAMIN D3) 50 MCG (2000 UT) TABS Take by mouth.    [provider]  ?Cyanocobalamin (VITAMIN B 12 PO) Take by mouth.    [provider]  ?finasteride (PROSCAR) 5 MG tablet Take 1 tablet (5 mg total) by mouth daily with supper. 11/23/20   Leonie Man, MD  ?metoprolol tartrate (LOPRESSOR) 25 MG tablet Take 1 tablet (25 mg total) by mouth 2 (two) times daily as needed. 10/22/21   Susy Frizzle, MD  ?   ? ?Allergies    ?Latex   ? ?Review of Systems   ?Review of Systems ? ?Physical Exam ?Updated Vital Signs ?BP (!) 147/83 (BP Location: Left Arm)   Pulse 77   Temp (!) 96.6 ?F (35.9 ?C) (Axillary) Comment: he just had a cold drink, oral wouldnt register.  Resp 16   SpO2 100%  ? ?Physical Exam ?Vitals and nursing note reviewed.   ?Constitutional:   ?   General: He is in acute distress (Patient appears uncomfortable).  ?   Appearance: He is well-developed.  ?HENT:  ?   Head: Normocephalic and atraumatic.  ?Eyes:  ?   General:     ?   Right eye: No discharge.     ?   Left eye: No discharge.  ?   Conjunctiva/sclera: Conjunctivae normal.  ?Cardiovascular:  ?   Rate and Rhythm: Normal rate and regular rhythm.  ?   Heart sounds: Normal heart sounds.  ?   Comments: Regular rhythm ?Pulmonary:  ?   Effort: Pulmonary effort is normal.  ?   Breath sounds: Normal breath sounds.  ?Abdominal:  ?   Palpations: Abdomen is soft.  ?   Tenderness: There is abdominal tenderness. There is no guarding or rebound.  ?Musculoskeletal:  ?   Cervical back: Normal range of motion and neck supple.  ?Skin: ?   General: Skin is warm and dry.  ?Neurological:  ?   Mental Status: He is alert.  ? ? ?ED Results / Procedures / Treatments   ?Labs ?(all labs ordered are listed, but only abnormal results are displayed) ?Labs Reviewed  ?URINALYSIS, ROUTINE W REFLEX MICROSCOPIC - Abnormal; Notable for the following components:  ?    Result Value  ? Color,  Urine STRAW (*)   ? Specific Gravity, Urine >1.046 (*)   ? Hgb urine dipstick SMALL (*)   ? Bacteria, UA RARE (*)   ? All other components within normal limits  ?CBC WITH DIFFERENTIAL/PLATELET - Abnormal; Notable for the following components:  ? WBC 11.8 (*)   ? MCHC 36.3 (*)   ? Neutro Abs 9.9 (*)   ? All other components within normal limits  ?COMPREHENSIVE METABOLIC PANEL - Abnormal; Notable for the following components:  ? Glucose, Bld 145 (*)   ? All other components within normal limits  ?LACTIC ACID, PLASMA - Abnormal; Notable for the following components:  ? Lactic Acid, Venous 3.3 (*)   ? All other components within normal limits  ?LACTIC ACID, PLASMA - Abnormal; Notable for the following components:  ? Lactic Acid, Venous 2.4 (*)   ? All other components within normal limits  ?LIPASE, BLOOD  ? ? ?EKG ?EKG  Interpretation ? ?Date/Time:  Monday November 22 2021 97:67:34 EDT ?Ventricular Rate:  73 ?PR Interval:  167 ?QRS Duration: 91 ?QT Interval:  373 ?QTC Calculation: 411 ?R Axis:   37 ?Text Interpretation: Sinus rhythm Low voltage, precordial leads Probable anteroseptal infarct, old No old tracing to compare Confirmed by Daleen Bo [phone]) on 11/22/2021 11:36:17 AM ? ?Radiology ?CT ABDOMEN PELVIS W CONTRAST ? ?Result Date: 11/22/2021 ?CLINICAL DATA:  Left-sided abdominal pain since this morning. EXAM: CT ABDOMEN AND PELVIS WITH CONTRAST TECHNIQUE: Multidetector CT imaging of the abdomen and pelvis was performed using the standard protocol following bolus administration of intravenous contrast. RADIATION DOSE REDUCTION: This exam was performed according to the departmental dose-optimization program which includes automated exposure control, adjustment of the mA and/or kV according to patient size and/or use of iterative reconstruction technique. CONTRAST:  178m OMNIPAQUE IOHEXOL 300 MG/ML  SOLN COMPARISON:  CT abdomen pelvis dated July 21, 2018. FINDINGS: Lower chest: No acute abnormality. Hepatobiliary: Multiple hepatic cysts again noted. The largest cyst in the inferior right liver has regressed in the interim. No new focal liver abnormality. Unchanged small gallstones in the gallbladder neck. No gallbladder wall thickening or biliary dilatation. Pancreas: Unremarkable. No pancreatic ductal dilatation or surrounding inflammatory changes. Spleen: Normal in size without focal abnormality. Adrenals/Urinary Tract: The right adrenal gland is normal. 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 No follow-up imaging is recommended. New 4 mm calculus in the distal left ureter with resultant mild left hydroureteronephrosis. Mildly delayed enhancement of and contrast excretion from the left kidney. Mild left renal, parapelvic, and periureteral fat stranding. Additional punctate bilateral renal calculi.  Unchanged left parapelvic renal cysts. No follow-up imaging is recommended. The bladder is unremarkable. Stomach/Bowel: Unchanged small gastric diverticulum near the fundus. The stomach is otherwise within normal limits. No bowel wall thickening, distention, or surrounding inflammatory changes. Left-sided colonic diverticulosis. Normal appendix. Vascular/Lymphatic: Aortic atherosclerosis. No enlarged abdominal or pelvic lymph nodes. Reproductive: Unchanged severe prostatomegaly with median lobe hypertrophy indenting the bladder base. Other: Unchanged small fat containing bilateral inguinal hernias. No free fluid or pneumoperitoneum. Musculoskeletal: No acute or significant osseous findings. IMPRESSION: 1. New 4 mm calculus in the distal left ureter with resultant mild left hydroureteronephrosis. 2. Additional punctate bilateral nephrolithiasis. 3. Unchanged cholelithiasis. 4. Aortic Atherosclerosis (ICD10-I70.0). Electronically Signed   By: WTitus DubinM.D.   On: 11/22/2021 08:30   ? ?Procedures ?Procedures  ? ? ?Medications Ordered in ED ?Medications  ?morphine (PF) 4 MG/ML injection 4 mg (4 mg Intravenous Given 11/22/21 0704)  ?ondansetron (ZOFRAN)  injection 4 mg (4 mg Intravenous Given 11/22/21 0703)  ?sodium chloride 0.9 % bolus 500 mL (0 mLs Intravenous Stopped 11/22/21 0757)  ?iohexol (OMNIPAQUE) 300 MG/ML solution 100 mL (100 mLs Intravenous Contrast Given 11/22/21 0806)  ?sodium chloride (PF) 0.9 % injection (  Given by Other 11/22/21 0756)  ?ketorolac (TORADOL) 15 MG/ML injection 15 mg (15 mg Intravenous Given 11/22/21 0832)  ?sodium chloride 0.9 % bolus 500 mL (0 mLs Intravenous Stopped 11/22/21 1150)  ?morphine (PF) 4 MG/ML injection 4 mg (4 mg Intravenous Given 11/22/21 0858)  ?oxyCODONE (Oxy IR/ROXICODONE) immediate release tablet 5 mg (5 mg Oral Given 11/22/21 1148)  ? ? ?ED Course/ Medical Decision Making/ A&P ?  ? ?Patient seen and examined. History obtained directly from patient.  ? ?Labs/EKG: Ordered  CBC, CMP, lipase, UA, lactate. ? ?Imaging: Ordered CT abdomen pelvis with contrast. ? ?Medications/Fluids: Ordered: IV morphine, Zofran.  ? ?Most recent vital signs reviewed and are as follows: ?BP (!) 147/83 (BP Location: Left Arm)   Pulse

## 2021-11-30 ENCOUNTER — Other Ambulatory Visit (HOSPITAL_COMMUNITY): Payer: Self-pay

## 2021-11-30 DIAGNOSIS — M25512 Pain in left shoulder: Secondary | ICD-10-CM | POA: Diagnosis not present

## 2021-11-30 DIAGNOSIS — M19012 Primary osteoarthritis, left shoulder: Secondary | ICD-10-CM | POA: Diagnosis not present

## 2021-11-30 MED ORDER — FINASTERIDE 5 MG PO TABS
5.0000 mg | ORAL_TABLET | Freq: Every day | ORAL | 3 refills | Status: DC
  Filled 2021-11-30: qty 90, 90d supply, fill #0
  Filled 2022-03-20: qty 90, 90d supply, fill #1
  Filled 2022-06-21: qty 90, 90d supply, fill #2

## 2021-12-01 DIAGNOSIS — M19019 Primary osteoarthritis, unspecified shoulder: Secondary | ICD-10-CM | POA: Insufficient documentation

## 2021-12-03 DIAGNOSIS — N201 Calculus of ureter: Secondary | ICD-10-CM | POA: Diagnosis not present

## 2021-12-23 DIAGNOSIS — N201 Calculus of ureter: Secondary | ICD-10-CM | POA: Diagnosis not present

## 2021-12-29 ENCOUNTER — Encounter: Payer: Self-pay | Admitting: Family Medicine

## 2021-12-29 DIAGNOSIS — I10 Essential (primary) hypertension: Secondary | ICD-10-CM

## 2021-12-29 NOTE — Telephone Encounter (Signed)
LOV 10/22/21 ?Last refill 10/22/21, #30, 0 refills ? ?Please review, thanks! ? ?

## 2021-12-30 ENCOUNTER — Other Ambulatory Visit (HOSPITAL_COMMUNITY): Payer: Self-pay

## 2021-12-30 MED ORDER — ALPRAZOLAM 0.5 MG PO TABS
0.5000 mg | ORAL_TABLET | Freq: Every evening | ORAL | 0 refills | Status: DC | PRN
  Filled 2021-12-30: qty 30, 30d supply, fill #0

## 2022-02-09 ENCOUNTER — Encounter: Payer: Self-pay | Admitting: Family Medicine

## 2022-02-09 ENCOUNTER — Telehealth (HOSPITAL_BASED_OUTPATIENT_CLINIC_OR_DEPARTMENT_OTHER): Payer: Self-pay | Admitting: Pharmacist

## 2022-02-09 ENCOUNTER — Other Ambulatory Visit (HOSPITAL_BASED_OUTPATIENT_CLINIC_OR_DEPARTMENT_OTHER): Payer: Self-pay

## 2022-02-09 MED ORDER — PAXLOVID (300/100) 20 X 150 MG & 10 X 100MG PO TBPK
ORAL_TABLET | ORAL | 0 refills | Status: DC
  Filled 2022-02-09 – 2022-02-10 (×4): qty 30, 5d supply, fill #0

## 2022-02-09 NOTE — Telephone Encounter (Signed)
Outpatient Pharmacy Oral COVID Treatment Note  I connected with John Evans on 02/09/2022/3:28 PM by telephone and verified that I am speaking with the correct person using two identifiers.  I discussed the limitations, risks, security, and privacy concerns of performing an evaluation and management service by telephone and the availability of in person appointments via referral to a physician. The patient expressed understanding and agreed to proceed.  Pharmacy location: [facility]  Diagnosis: COVID-19 infection  Purpose of visit: Discussion of potential use of Paxlovid, a new treatment for mild to moderate COVID-19 viral infection in non-hospitalized patients.  Subjective/Objective: Patient is a 81 y.o. male who is presenting with COVID 19 viral infection.  COVID 19 viral infection. Their symptoms began on 02/07/22 with cough.  The patient has confirmed COVID-19 via a home test   Past Medical History:  Diagnosis Date   BPH (benign prostatic hyperplasia)    Gout    Hyperlipidemia    Patient reportedly had a coronary calcium score of 21 last year I cannot find his report   Hypertension    Insomnia    Obstructive sleep apnea    Panic attacks      Allergies  Allergen Reactions   Latex Rash      Lab Monitoring: eGFR 86  Drug Interactions Noted: None  Plan:  This patient is a 81 y.o. male that meets the criteria for Emergency Use Authorization of Paxlovid. After reviewing the emergency use authorization with the patient, the patient agrees to receive Paxlovid.  Through FDA guidance and current [facility] standing order Paxlovid will be prescribed to the patient.    Delivery or Pick-Up Date: 02/09/22  Follow up instructions:    Take prescription BID x 5 days as directed Counseling was provided by pharmacist. Reach out to pharmacist with follow up questions For concerns regarding further COVID symptoms please follow up with your PCP or urgent care For urgent or  life-threatening issues, seek care at your local emergency department   Margie Ege 02/09/2022, 3:28 [place] Pharmacist Phone# [phone]

## 2022-02-10 ENCOUNTER — Other Ambulatory Visit (HOSPITAL_COMMUNITY): Payer: Self-pay

## 2022-02-10 ENCOUNTER — Other Ambulatory Visit: Payer: Self-pay | Admitting: Family Medicine

## 2022-02-10 ENCOUNTER — Other Ambulatory Visit (HOSPITAL_BASED_OUTPATIENT_CLINIC_OR_DEPARTMENT_OTHER): Payer: Self-pay

## 2022-02-10 MED ORDER — MOLNUPIRAVIR EUA 200MG CAPSULE
4.0000 | ORAL_CAPSULE | Freq: Two times a day (BID) | ORAL | 0 refills | Status: AC
  Filled 2022-02-10 (×2): qty 40, 5d supply, fill #0

## 2022-03-10 DIAGNOSIS — M25512 Pain in left shoulder: Secondary | ICD-10-CM | POA: Diagnosis not present

## 2022-03-10 DIAGNOSIS — M79672 Pain in left foot: Secondary | ICD-10-CM | POA: Diagnosis not present

## 2022-03-17 DIAGNOSIS — M79672 Pain in left foot: Secondary | ICD-10-CM | POA: Diagnosis not present

## 2022-03-21 ENCOUNTER — Other Ambulatory Visit (HOSPITAL_COMMUNITY): Payer: Self-pay

## 2022-03-29 DIAGNOSIS — M75102 Unspecified rotator cuff tear or rupture of left shoulder, not specified as traumatic: Secondary | ICD-10-CM | POA: Diagnosis not present

## 2022-03-29 DIAGNOSIS — M722 Plantar fascial fibromatosis: Secondary | ICD-10-CM | POA: Diagnosis not present

## 2022-04-28 ENCOUNTER — Other Ambulatory Visit (HOSPITAL_COMMUNITY): Payer: Self-pay

## 2022-04-28 MED ORDER — FINASTERIDE 5 MG PO TABS
5.0000 mg | ORAL_TABLET | Freq: Every day | ORAL | 3 refills | Status: DC
  Filled 2022-04-28 – 2022-09-19 (×2): qty 90, 90d supply, fill #0
  Filled 2022-12-21: qty 90, 90d supply, fill #1
  Filled 2023-03-17: qty 90, 90d supply, fill #2

## 2022-05-02 DIAGNOSIS — L03031 Cellulitis of right toe: Secondary | ICD-10-CM | POA: Insufficient documentation

## 2022-05-06 ENCOUNTER — Other Ambulatory Visit (HOSPITAL_COMMUNITY): Payer: Self-pay

## 2022-05-06 DIAGNOSIS — M1009 Idiopathic gout, multiple sites: Secondary | ICD-10-CM | POA: Diagnosis not present

## 2022-05-06 DIAGNOSIS — M79674 Pain in right toe(s): Secondary | ICD-10-CM | POA: Diagnosis not present

## 2022-05-06 MED ORDER — COLCHICINE 0.6 MG PO TABS
0.6000 mg | ORAL_TABLET | Freq: Two times a day (BID) | ORAL | 0 refills | Status: DC
  Filled 2022-05-06: qty 30, 15d supply, fill #0

## 2022-05-13 ENCOUNTER — Ambulatory Visit (INDEPENDENT_AMBULATORY_CARE_PROVIDER_SITE_OTHER): Payer: Medicare Other | Admitting: Family Medicine

## 2022-05-13 ENCOUNTER — Other Ambulatory Visit (HOSPITAL_COMMUNITY): Payer: Self-pay

## 2022-05-13 VITALS — BP 128/82 | HR 79 | Temp 97.3°F | Wt 190.0 lb

## 2022-05-13 DIAGNOSIS — L03031 Cellulitis of right toe: Secondary | ICD-10-CM | POA: Diagnosis not present

## 2022-05-13 DIAGNOSIS — M1A9XX1 Chronic gout, unspecified, with tophus (tophi): Secondary | ICD-10-CM | POA: Diagnosis not present

## 2022-05-13 DIAGNOSIS — M1009 Idiopathic gout, multiple sites: Secondary | ICD-10-CM | POA: Diagnosis not present

## 2022-05-13 MED ORDER — FEBUXOSTAT 40 MG PO TABS
40.0000 mg | ORAL_TABLET | Freq: Every day | ORAL | 3 refills | Status: DC
  Filled 2022-05-13: qty 90, 90d supply, fill #0
  Filled 2022-05-16 (×2): qty 30, 30d supply, fill #0
  Filled 2022-05-16: qty 90, 90d supply, fill #0

## 2022-05-13 MED ORDER — COLCHICINE 0.6 MG PO TABS
0.6000 mg | ORAL_TABLET | Freq: Every day | ORAL | 5 refills | Status: DC
  Filled 2022-05-13: qty 30, 30d supply, fill #0

## 2022-05-13 MED ORDER — CEPHALEXIN 500 MG PO CAPS
500.0000 mg | ORAL_CAPSULE | Freq: Three times a day (TID) | ORAL | 0 refills | Status: DC
  Filled 2022-05-13: qty 21, 7d supply, fill #0

## 2022-05-13 MED ORDER — PREDNISONE 20 MG PO TABS
ORAL_TABLET | ORAL | 0 refills | Status: AC
  Filled 2022-05-13: qty 12, 6d supply, fill #0

## 2022-05-13 NOTE — Progress Notes (Signed)
Subjective:    Patient ID: John Evans, male    DOB: [birth year], 81 y.o.   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  In the past he took allopurinol but he does not believe that the allopurinol was effective in controlling his gout.  He is currently on colchicine but the pain is still quite severe Past Medical History:  Diagnosis Date   BPH (benign prostatic hyperplasia)    Gout    Hyperlipidemia    Patient reportedly had a coronary calcium score of 21 last year I cannot find his report   Hypertension    Insomnia    Obstructive sleep apnea    Panic attacks    Past Surgical History:  Procedure Laterality Date   BACK SURGERY     EYE SURGERY     Current Outpatient Medications on File Prior to Visit  Medication Sig Dispense Refill   ALPRAZolam (XANAX) 0.5 MG tablet Take 1 tablet by mouth at bedtime as needed. 30 tablet 0   aspirin 81 MG tablet Take 81 mg by mouth daily. Every other day     cephALEXin (KEFLEX) 500 MG capsule Take 1 capsule (500 mg total) by mouth 3 (three) times daily for 7 days as directed 21 capsule 0   Cholecalciferol (VITAMIN D3) 50 MCG (2000 UT) TABS Take by mouth.     Cyanocobalamin (VITAMIN B 12 PO) Take by mouth.     finasteride (PROSCAR) 5 MG tablet Take 1 tablet (5 mg total) by mouth daily with supper.     finasteride (PROSCAR) 5 MG tablet Take 1 tablet (5 mg total) by mouth daily. 90 tablet 3   finasteride (PROSCAR) 5 MG tablet Take 1 tablet (5 mg total) by mouth daily. 90 tablet 3   metoprolol tartrate (LOPRESSOR) 25 MG tablet Take 1 tablet (25 mg total) by mouth 2 (two) times daily as needed. 60 tablet 3   nirmatrelvir & ritonavir (PAXLOVID, 300/100,) 20 x 150 MG & 10 x '100MG'$  TBPK Take 3 tablets by mouth twice daily as directed on package. 30  tablet 0   ondansetron (ZOFRAN-ODT) 4 MG disintegrating tablet Dissolve 1 tablet (4 mg total) by mouth every 8 (eight) hours as needed for nausea or vomiting. 10 tablet 0   oxyCODONE (OXY IR/ROXICODONE) 5 MG immediate release tablet Take 1/2-1 tablet (2.5-5 mg total) by mouth every 6 (six) hours as needed for severe pain. 6 tablet 0   tamsulosin (FLOMAX) 0.4 MG CAPS capsule Take 1 capsule (0.4 mg total) by mouth daily. 7 capsule 0   No current facility-administered medications on file prior to visit.   Allergies  Allergen Reactions   Latex Rash   Social History   Socioeconomic History   Marital status: Married    Spouse name: Remo Lipps   Number of children: 2   Years of education: Not on file   Highest education level: Not on file  Occupational History   Occupation: 2  Tobacco Use   Smoking status: Never   Smokeless tobacco: Never  Substance and Sexual Activity   Alcohol use: No   Drug use: No   Sexual activity: Yes    Comment: married, retired Software engineer  Other Topics Concern   Not on file  Social  History Narrative   Married x 55 years.    4 grandchildren   Social Determinants of Health   Financial Resource Strain: Low Risk  (08/26/2021)   Overall Financial Resource Strain (CARDIA)    Difficulty of Paying Living Expenses: Not hard at all  Food Insecurity: No Food Insecurity (08/26/2021)   Hunger Vital Sign    Worried About Running Out of Food in the Last Year: Never true    Ran Out of Food in the Last Year: Never true  Transportation Needs: No Transportation Needs (08/26/2021)   PRAPARE - Hydrologist (Medical): No    Lack of Transportation (Non-Medical): No  Physical Activity: Inactive (08/26/2021)   Exercise Vital Sign    Days of Exercise per Week: 0 days    Minutes of Exercise per Session: 0 min  Stress: No Stress Concern Present (08/26/2021)   [place]    Feeling of Stress  : Not at all  Social Connections: [place] (08/26/2021)   Social Connection and Isolation Panel [NHANES]    Frequency of Communication with Friends and Family: More than three times a week    Frequency of Social Gatherings with Friends and Family: More than three times a week    Attends Religious Services: More than 4 times per year    Active Member of [employer] or Organizations: Yes    Attends Music therapist: More than 4 times per year    Marital Status: Married  Human resources officer Violence: Not At Risk (08/26/2021)   Humiliation, Afraid, Rape, and Kick questionnaire    Fear of Current or Ex-Partner: No    Emotionally Abused: No    Physically Abused: No    Sexually Abused: No       Review of Systems     Objective:   Physical Exam Constitutional:      General: He is not in acute distress.    Appearance: Normal appearance. He is not ill-appearing or toxic-appearing.  Cardiovascular:     Rate and Rhythm: Normal rate.     Heart sounds: Normal heart sounds. No murmur heard. Pulmonary:     Effort: Pulmonary effort is normal.     Breath sounds: Normal breath sounds.  Abdominal:     General: Bowel sounds are normal.     Palpations: Abdomen is soft.     Tenderness: There is no abdominal tenderness.  Musculoskeletal:     Right foot: Swelling and deformity present.       Feet:  Neurological:     Mental Status: He is alert.           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  Recommended using a prednisone taper pack to treat the current exacerbation.  I do not believe that this is septic arthritis.  Once the current exacerbation is calm down I would recommend starting Uloric 40 mg daily in addition to colchicine 0.6 mg daily.  I will likely take 3 to 6 months to allow uric acid crystals to slowly evacuate the  joint.  Continue colchicine until uric acid levels have fallen below 6 and then leave the patient on Uloric as maintenance therapy.

## 2022-05-14 LAB — BASIC METABOLIC PANEL WITH GFR
BUN: 16 mg/dL (ref 7–25)
CO2: 30 mmol/L (ref 20–32)
Calcium: 9.7 mg/dL (ref 8.6–10.3)
Chloride: 104 mmol/L (ref 98–110)
Creat: 1.05 mg/dL (ref 0.70–1.22)
Glucose, Bld: 92 mg/dL (ref 65–99)
Potassium: 4.6 mmol/L (ref 3.5–5.3)
Sodium: 141 mmol/L (ref 135–146)
eGFR: 71 mL/min/{1.73_m2} (ref >=60)

## 2022-05-14 LAB — URIC ACID: Uric Acid, Serum: 7.6 mg/dL (ref 4.0–8.0)

## 2022-05-16 ENCOUNTER — Other Ambulatory Visit (HOSPITAL_COMMUNITY): Payer: Self-pay

## 2022-05-17 DIAGNOSIS — M79671 Pain in right foot: Secondary | ICD-10-CM | POA: Diagnosis not present

## 2022-05-27 ENCOUNTER — Telehealth: Payer: Self-pay

## 2022-05-27 ENCOUNTER — Other Ambulatory Visit (HOSPITAL_COMMUNITY): Payer: Self-pay

## 2022-05-27 ENCOUNTER — Other Ambulatory Visit: Payer: Self-pay | Admitting: Family Medicine

## 2022-05-27 MED ORDER — ALLOPURINOL 300 MG PO TABS: 300.0000 mg | ORAL_TABLET | Freq: Every day | ORAL | 6 refills | Status: DC

## 2022-05-27 NOTE — Telephone Encounter (Signed)
Pt called and stated that since started the Uloric pt has had issues w/diarrhea,cramping and just can not tolerate it. Per pt stated he would like to try the allopurinol as the second choice?  Pls advice

## 2022-05-27 NOTE — Telephone Encounter (Signed)
Pt called in to let pcp know that the med he has been taking for his gout is not working well for him. Pt would like to know if there is another med that he can take. Please advise.  Cb#: [phone]

## 2022-05-30 NOTE — Telephone Encounter (Signed)
Spoke w/pt, stated that he has already picked up Rx at [facility]. Nothing further needed.

## 2022-05-30 NOTE — Telephone Encounter (Signed)
10/16/23This message has been taken care off.   Per pt this msg was in reference to the allopurinol. Which has already been taken care. Pt has nothing further.

## 2022-05-31 ENCOUNTER — Other Ambulatory Visit (HOSPITAL_COMMUNITY): Payer: Self-pay

## 2022-05-31 DIAGNOSIS — M1009 Idiopathic gout, multiple sites: Secondary | ICD-10-CM | POA: Diagnosis not present

## 2022-05-31 MED ORDER — POVIDONE-IODINE 10 % EX SOLN
CUTANEOUS | 0 refills | Status: DC
  Filled 2022-05-31: qty 237, 30d supply, fill #0

## 2022-06-02 ENCOUNTER — Other Ambulatory Visit (HOSPITAL_COMMUNITY): Payer: Self-pay

## 2022-06-02 DIAGNOSIS — L03031 Cellulitis of right toe: Secondary | ICD-10-CM | POA: Diagnosis not present

## 2022-06-02 DIAGNOSIS — M1009 Idiopathic gout, multiple sites: Secondary | ICD-10-CM | POA: Diagnosis not present

## 2022-06-02 MED ORDER — CEPHALEXIN 500 MG PO CAPS
500.0000 mg | ORAL_CAPSULE | Freq: Four times a day (QID) | ORAL | 0 refills | Status: DC
  Filled 2022-06-02: qty 28, 7d supply, fill #0

## 2022-06-10 DIAGNOSIS — L03031 Cellulitis of right toe: Secondary | ICD-10-CM | POA: Diagnosis not present

## 2022-06-13 DIAGNOSIS — M109 Gout, unspecified: Secondary | ICD-10-CM | POA: Diagnosis not present

## 2022-06-13 DIAGNOSIS — L84 Corns and callosities: Secondary | ICD-10-CM | POA: Diagnosis not present

## 2022-06-13 DIAGNOSIS — B351 Tinea unguium: Secondary | ICD-10-CM | POA: Diagnosis not present

## 2022-06-21 ENCOUNTER — Other Ambulatory Visit (HOSPITAL_COMMUNITY): Payer: Self-pay

## 2022-06-28 DIAGNOSIS — L03031 Cellulitis of right toe: Secondary | ICD-10-CM | POA: Diagnosis not present

## 2022-06-28 DIAGNOSIS — M1009 Idiopathic gout, multiple sites: Secondary | ICD-10-CM | POA: Diagnosis not present

## 2022-07-11 IMAGING — CT CT ABD-PELV W/ CM
2 of 5 series · 15 of 46 positions shown, 17 images · IV contrast (OMNIPAQUE 300)
Comparison: CT abdomen pelvis dated July 21, 2018.

CLINICAL DATA: Left-sided abdominal pain since this morning.

EXAM:
CT ABDOMEN AND PELVIS WITH CONTRAST
TECHNIQUE: Multidetector CT imaging of the abdomen and pelvis was performed
using the standard protocol following bolus administration of
intravenous contrast.

[Series 2: axial st · axial · 0.88mm/px · z∈[-477,-27]mm · 12 of 104 slices shown, 14 images]
[im 7/104  soft-tissue]
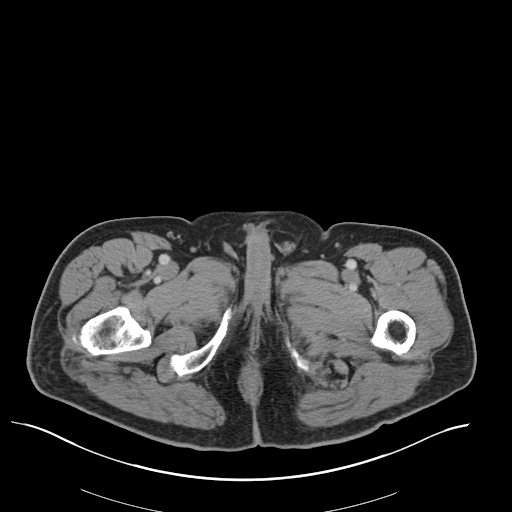
[im 7/104  bone]
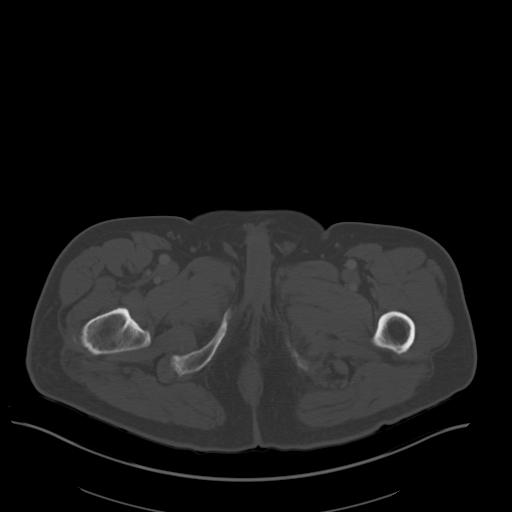
[im 14/104  soft-tissue]
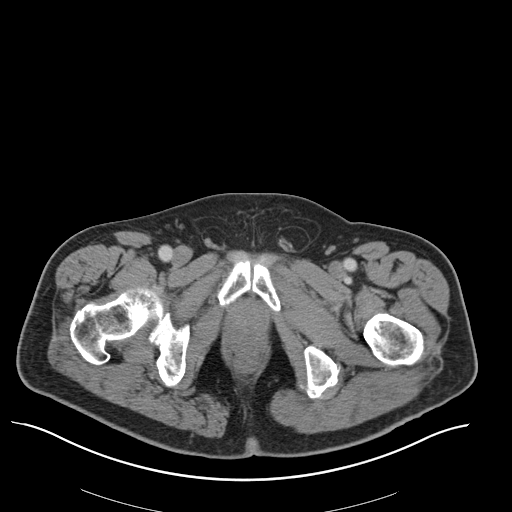
[im 21/104  soft-tissue]
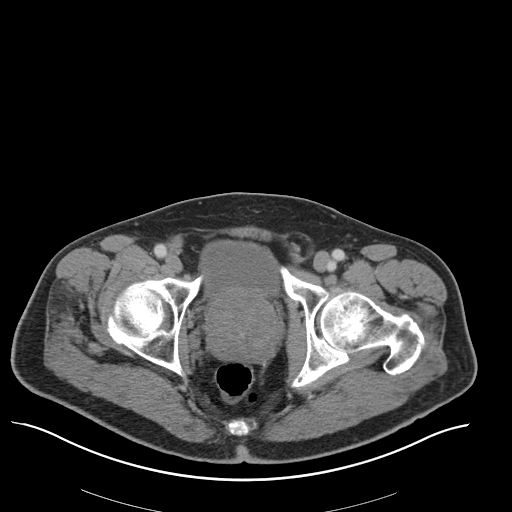
[im 35/104  soft-tissue]
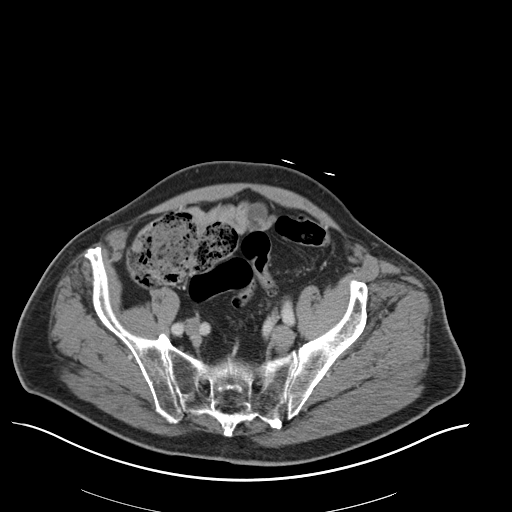
[im 42/104  soft-tissue]
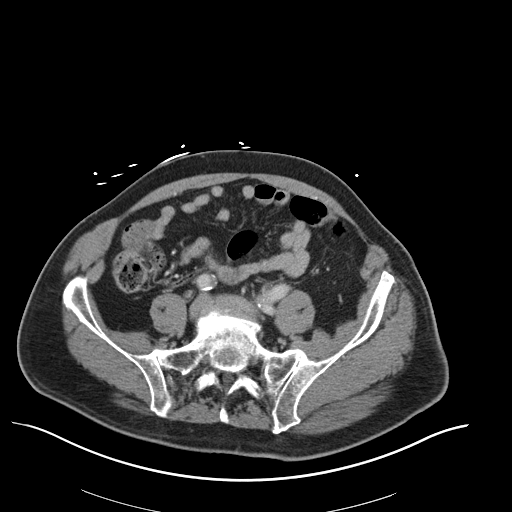
[im 49/104  soft-tissue]
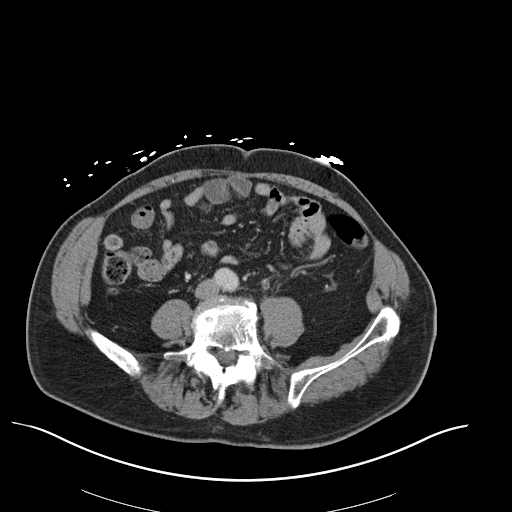
[im 55/104  soft-tissue]
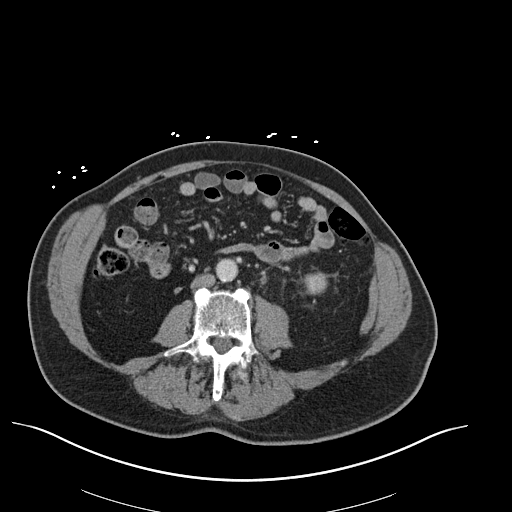
[im 62/104  soft-tissue]
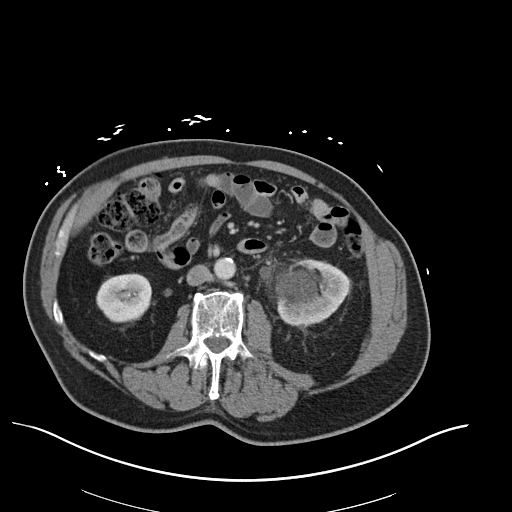
[im 69/104  soft-tissue]
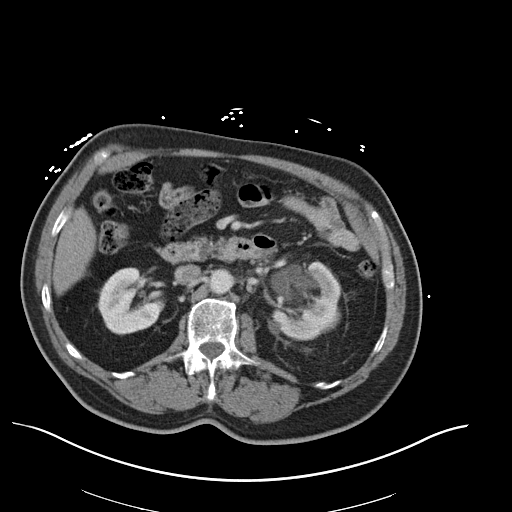
[im 69/104  bone]
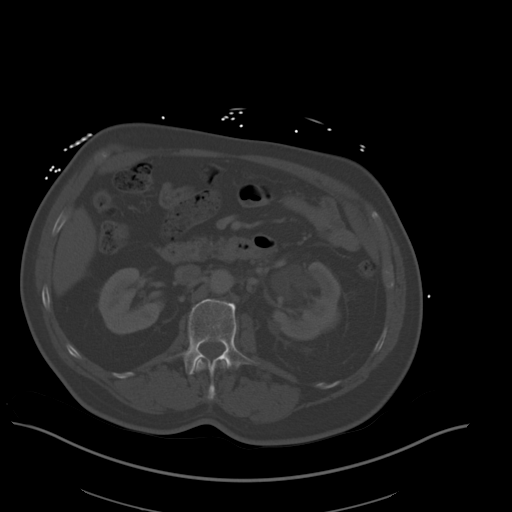
[im 83/104  soft-tissue]
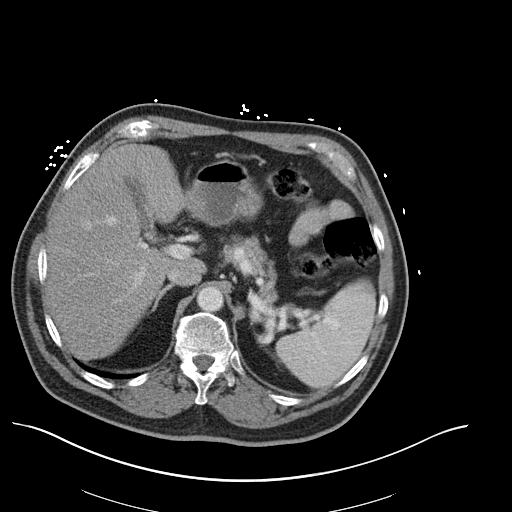
[im 90/104  soft-tissue]
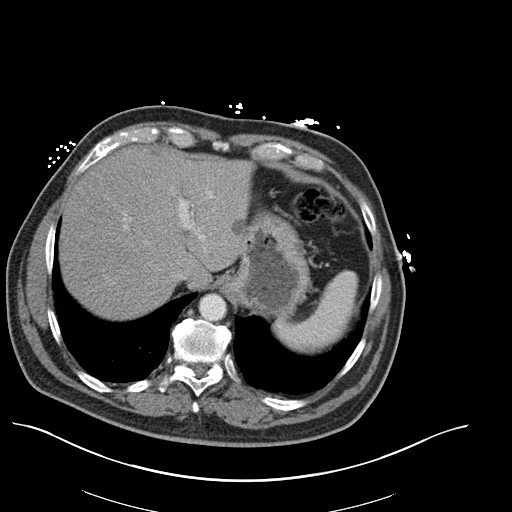
[im 97/104  soft-tissue]
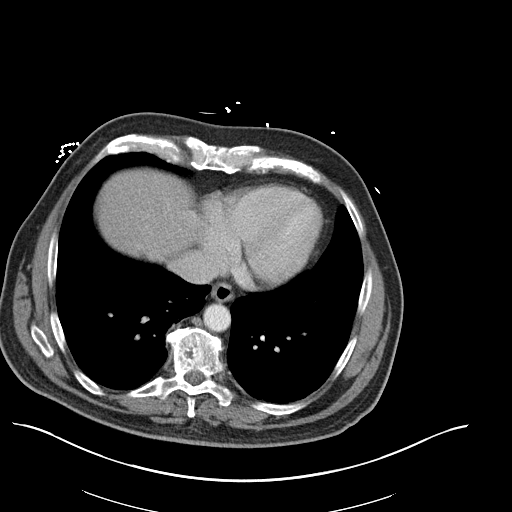

[Series 4: coronal st · coronal · 0.82mm/px · 3 of 150 slices shown]
[im 50/150  soft-tissue]
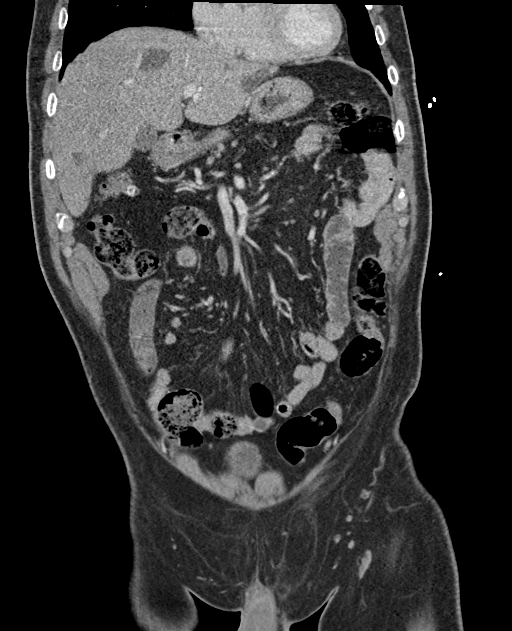
[im 67/150  soft-tissue]
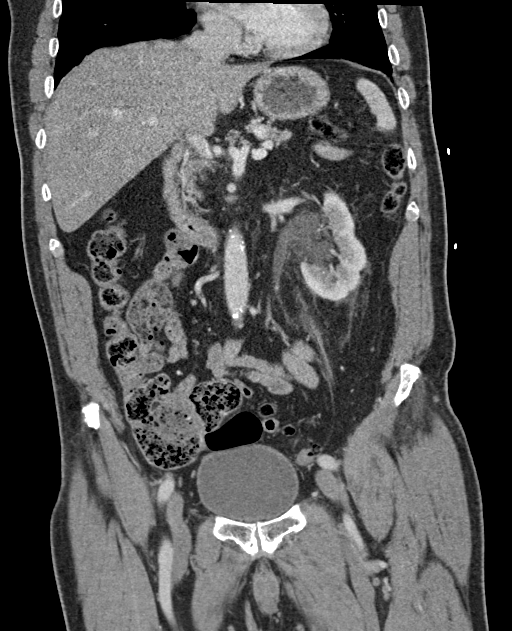
[im 83/150  soft-tissue]
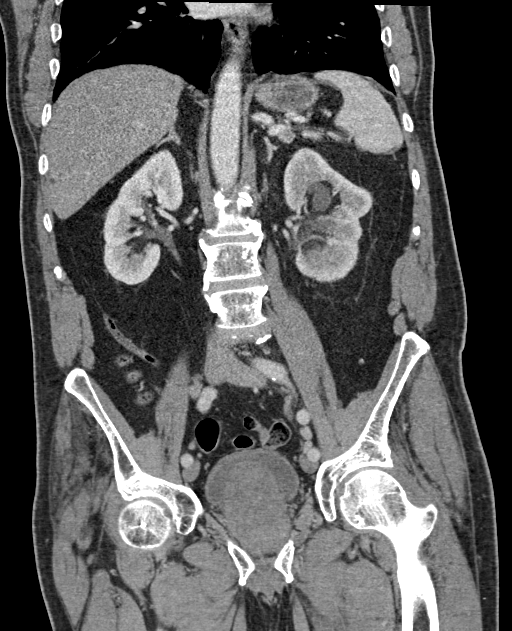

[15 of 46 positions shown; findings below may reference images not displayed]

RADIATION DOSE REDUCTION: This exam was performed according to the
departmental dose-optimization program which includes automated
exposure control, adjustment of the mA and/or kV according to
patient size and/or use of iterative reconstruction technique.

CONTRAST:  100mL OMNIPAQUE IOHEXOL 300 MG/ML  SOLN
FINDINGS: Lower chest: No acute abnormality.

Hepatobiliary: Multiple hepatic cysts again noted. The largest cyst
in the inferior right liver has regressed in the interim. No new
focal liver abnormality. Unchanged small gallstones in the
gallbladder neck. No gallbladder wall thickening or biliary
dilatation.

Pancreas: Unremarkable. No pancreatic ductal dilatation or
surrounding inflammatory changes.

Spleen: Normal in size without focal abnormality.

Adrenals/Urinary Tract: The right adrenal gland is normal. Stable
1.7 cm left adrenal adenoma, unchanged since 1090. No follow-up
imaging is recommended. New 4 mm calculus in the distal left ureter
with resultant mild left hydroureteronephrosis. Mildly delayed
enhancement of and contrast excretion from the left kidney. Mild
left renal, parapelvic, and periureteral fat stranding. Additional
punctate bilateral renal calculi. Unchanged left parapelvic renal
cysts. No follow-up imaging is recommended. The bladder is
unremarkable.

Stomach/Bowel: Unchanged small gastric diverticulum near the fundus.
The stomach is otherwise within normal limits. No bowel wall
thickening, distention, or surrounding inflammatory changes.
Left-sided colonic diverticulosis. Normal appendix.

Vascular/Lymphatic: Aortic atherosclerosis. No enlarged abdominal or
pelvic lymph nodes.

Reproductive: Unchanged severe prostatomegaly with median lobe
hypertrophy indenting the bladder base.

Other: Unchanged small fat containing bilateral inguinal hernias. No
free fluid or pneumoperitoneum.

Musculoskeletal: No acute or significant osseous findings.
IMPRESSION: 1. New 4 mm calculus in the distal left ureter with resultant mild
left hydroureteronephrosis.
2. Additional punctate bilateral nephrolithiasis.
3. Unchanged cholelithiasis.
4. Aortic Atherosclerosis ([id number]-YL0.0).

## 2022-07-22 DIAGNOSIS — M1009 Idiopathic gout, multiple sites: Secondary | ICD-10-CM | POA: Diagnosis not present

## 2022-07-22 DIAGNOSIS — L03031 Cellulitis of right toe: Secondary | ICD-10-CM | POA: Diagnosis not present

## 2022-07-25 DIAGNOSIS — L03031 Cellulitis of right toe: Secondary | ICD-10-CM | POA: Diagnosis not present

## 2022-07-28 ENCOUNTER — Other Ambulatory Visit (HOSPITAL_COMMUNITY): Payer: Self-pay

## 2022-07-28 ENCOUNTER — Other Ambulatory Visit: Payer: Self-pay | Admitting: Family Medicine

## 2022-07-28 ENCOUNTER — Telehealth: Payer: Self-pay

## 2022-07-28 NOTE — Telephone Encounter (Signed)
Medication was d/c'd 05/27/2022 - not on med list, Requested Prescriptions  Pending Prescriptions Disp Refills   febuxostat (ULORIC) 40 MG tablet 90 tablet 3    Sig: Take 1 tablet (40 mg total) by mouth daily.     Endocrinology: Gout Agents - febuxostat Passed - 07/28/2022  8:57 AM      Passed - Uric Acid in normal range and within 360 days    Uric Acid, Serum  Date Value Ref Range Status  05/13/2022 7.6 4.0 - 8.0 mg/dL Final    Comment:    Therapeutic target for gout patients: <6.0 mg/dL .          Passed - Cr in normal range and within 360 days    Creat  Date Value Ref Range Status  05/13/2022 1.05 0.70 - 1.22 mg/dL Final         Passed - AST in normal range and within 360 days    AST  Date Value Ref Range Status  11/22/2021 22 15 - 41 U/L Final         Passed - ALT in normal range and within 360 days    ALT  Date Value Ref Range Status  11/22/2021 18 0 - 44 U/L Final         Passed - Valid encounter within last 12 months    Recent Outpatient Visits           9 months ago Benign essential HTN   [place] Pickard, Cammie Mcgee, MD   1 year ago Chronic left shoulder pain   [place] Pickard, Cammie Mcgee, MD   1 year ago Orthostatic dizziness   [place] Pickard, Cammie Mcgee, MD   1 year ago Pure hypercholesterolemia   [place] Susy Frizzle, MD   2 years ago Acute idiopathic gout, unspecified site   [place], Cammie Mcgee, MD       Future Appointments             In 2 weeks Pickard, Cammie Mcgee, MD [place]

## 2022-07-28 NOTE — Telephone Encounter (Signed)
Per pt called stating that he believe that it was the cephalexin that he was not tolerating and not the Uloric as he thought at first.   Pt requesting to be back on the Uloric '40mg'$  and take allopurinol (ZYLOPRIM) 300 MG tablet off.   Per pt just had lab test done a few days ago w/[facility] and uric acid had came down to 3.3.  To be called into [facility]   Pls advice

## 2022-07-29 ENCOUNTER — Other Ambulatory Visit: Payer: Self-pay | Admitting: Family Medicine

## 2022-07-29 ENCOUNTER — Other Ambulatory Visit (HOSPITAL_COMMUNITY): Payer: Self-pay

## 2022-07-29 MED ORDER — FEBUXOSTAT 40 MG PO TABS
40.0000 mg | ORAL_TABLET | Freq: Every day | ORAL | 3 refills | Status: DC
  Filled 2022-07-29: qty 90, 90d supply, fill #0
  Filled 2022-10-28: qty 30, 30d supply, fill #1
  Filled 2022-10-28: qty 90, 90d supply, fill #1
  Filled 2022-11-30: qty 30, 30d supply, fill #0
  Filled 2023-01-02: qty 30, 30d supply, fill #1
  Filled 2023-01-31: qty 30, 30d supply, fill #2
  Filled 2023-03-01: qty 30, 30d supply, fill #3
  Filled 2023-04-04: qty 30, 30d supply, fill #4
  Filled 2023-05-02: qty 30, 30d supply, fill #5
  Filled 2023-06-06: qty 30, 30d supply, fill #6
  Filled 2023-07-06: qty 30, 30d supply, fill #7

## 2022-08-02 ENCOUNTER — Other Ambulatory Visit (HOSPITAL_COMMUNITY): Payer: Self-pay

## 2022-08-05 DIAGNOSIS — M79674 Pain in right toe(s): Secondary | ICD-10-CM | POA: Diagnosis not present

## 2022-08-16 ENCOUNTER — Ambulatory Visit: Payer: Medicare Other | Admitting: Family Medicine

## 2022-08-18 ENCOUNTER — Other Ambulatory Visit (HOSPITAL_COMMUNITY): Payer: Self-pay

## 2022-08-18 ENCOUNTER — Ambulatory Visit (INDEPENDENT_AMBULATORY_CARE_PROVIDER_SITE_OTHER): Payer: Medicare Other | Admitting: Family Medicine

## 2022-08-18 ENCOUNTER — Encounter: Payer: Self-pay | Admitting: Family Medicine

## 2022-08-18 VITALS — BP 132/86 | HR 77 | Ht 71.0 in | Wt 194.6 lb

## 2022-08-18 DIAGNOSIS — G4700 Insomnia, unspecified: Secondary | ICD-10-CM

## 2022-08-18 DIAGNOSIS — M1A9XX1 Chronic gout, unspecified, with tophus (tophi): Secondary | ICD-10-CM

## 2022-08-18 MED ORDER — ALPRAZOLAM 0.5 MG PO TABS
0.5000 mg | ORAL_TABLET | Freq: Every evening | ORAL | 0 refills | Status: DC | PRN
  Filled 2022-08-18 – 2022-09-06 (×2): qty 30, 30d supply, fill #0

## 2022-08-18 NOTE — Progress Notes (Signed)
Subjective:    Patient ID: John Evans, male    DOB: [birth year], 82 y.o.   MRN: [id number] When I last saw the patient in September he was dealing with tophaceous gout.  He has been taking Uloric since that time.  His uric acid level has dropped from 7.6-3.1 per his report.  He recently saw his orthopedist who checked his uric acid level.  We discussed the increased risk of cardiovascular disease comparing Uloric to allopurinol and he is comfortable continuing to take the medication.  He originally stopped the Uloric due to diarrhea but in retrospect he believes this was due to antibiotics that was prescribed by the orthopedist.  The pain in his toe is improving.  He is requesting a refill on his Xanax that he uses sparingly for sleep.  He estimates that he takes 1 pill every 2 weeks Past Medical History:  Diagnosis Date   BPH (benign prostatic hyperplasia)    Gout    Hyperlipidemia    Patient reportedly had a coronary calcium score of 21 last year I cannot find his report   Hypertension    Insomnia    Obstructive sleep apnea    Panic attacks    Past Surgical History:  Procedure Laterality Date   BACK SURGERY     EYE SURGERY     Current Outpatient Medications on File Prior to Visit  Medication Sig Dispense Refill   Cholecalciferol (VITAMIN D3) 50 MCG (2000 UT) TABS Take by mouth.     febuxostat (ULORIC) 40 MG tablet Take 1 tablet (40 mg total) by mouth daily. 90 tablet 3   finasteride (PROSCAR) 5 MG tablet Take 1 tablet (5 mg total) by mouth daily. 90 tablet 3   No current facility-administered medications on file prior to visit.   Allergies  Allergen Reactions   Latex Rash   Social History   Socioeconomic History   Marital status: Married    Spouse name: Remo Lipps   Number of children: 2   Years of education: Not on file   Highest education level: Not on file  Occupational History   Occupation: 2  Tobacco Use   Smoking status: Never   Smokeless tobacco: Never   Substance and Sexual Activity   Alcohol use: No   Drug use: No   Sexual activity: Yes    Comment: married, retired Software engineer  Other Topics Concern   Not on file  Social History Narrative   Married x 55 years.    4 grandchildren   Social Determinants of Health   Financial Resource Strain: Low Risk  (08/26/2021)   Overall Financial Resource Strain (CARDIA)    Difficulty of Paying Living Expenses: Not hard at all  Food Insecurity: No Food Insecurity (08/26/2021)   Hunger Vital Sign    Worried About Running Out of Food in the Last Year: Never true    Ran Out of Food in the Last Year: Never true  Transportation Needs: No Transportation Needs (08/26/2021)   PRAPARE - Hydrologist (Medical): No    Lack of Transportation (Non-Medical): No  Physical Activity: Inactive (08/26/2021)   Exercise Vital Sign    Days of Exercise per Week: 0 days    Minutes of Exercise per Session: 0 min  Stress: No Stress Concern Present (08/26/2021)   [place]    Feeling of Stress : Not at all  Social Connections: [place] (08/26/2021)   Social  Connection and Isolation Panel [NHANES]    Frequency of Communication with Friends and Family: More than three times a week    Frequency of Social Gatherings with Friends and Family: More than three times a week    Attends Religious Services: More than 4 times per year    Active Member of [employer] or Organizations: Yes    Attends Music therapist: More than 4 times per year    Marital Status: Married  Human resources officer Violence: Not At Risk (08/26/2021)   Humiliation, Afraid, Rape, and Kick questionnaire    Fear of Current or Ex-Partner: No    Emotionally Abused: No    Physically Abused: No    Sexually Abused: No       Review of Systems     Objective:   Physical Exam Constitutional:      General: He is not in acute distress.    Appearance:  Normal appearance. He is not ill-appearing or toxic-appearing.  Cardiovascular:     Rate and Rhythm: Normal rate.     Heart sounds: Normal heart sounds. No murmur heard. Pulmonary:     Effort: Pulmonary effort is normal.     Breath sounds: Normal breath sounds.  Abdominal:     General: Bowel sounds are normal.     Palpations: Abdomen is soft.     Tenderness: There is no abdominal tenderness.  Musculoskeletal:     Right foot: Swelling and deformity present.       Feet:  Neurological:     Mental Status: He is alert.           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  I would like to keep his uric acid level less than 4.  Patient will continue Uloric until March.  He will come in for his physical exam in March.  At that time we may try transitioning him to allopurinol to see if we can maintain a uric acid level less than 4 with allopurinol to reduce his risk of cardiovascular disease.  I will gladly refill his Xanax.  He is using this sparingly.

## 2022-09-01 ENCOUNTER — Other Ambulatory Visit (HOSPITAL_COMMUNITY): Payer: Self-pay

## 2022-09-06 ENCOUNTER — Other Ambulatory Visit (HOSPITAL_COMMUNITY): Payer: Self-pay

## 2022-09-19 ENCOUNTER — Other Ambulatory Visit (HOSPITAL_COMMUNITY): Payer: Self-pay

## 2022-09-30 DIAGNOSIS — M1009 Idiopathic gout, multiple sites: Secondary | ICD-10-CM | POA: Diagnosis not present

## 2022-10-11 DIAGNOSIS — Z961 Presence of intraocular lens: Secondary | ICD-10-CM | POA: Diagnosis not present

## 2022-10-11 DIAGNOSIS — H401131 Primary open-angle glaucoma, bilateral, mild stage: Secondary | ICD-10-CM | POA: Diagnosis not present

## 2022-10-11 DIAGNOSIS — H35352 Cystoid macular degeneration, left eye: Secondary | ICD-10-CM | POA: Diagnosis not present

## 2022-10-11 DIAGNOSIS — H2511 Age-related nuclear cataract, right eye: Secondary | ICD-10-CM | POA: Diagnosis not present

## 2022-10-12 DIAGNOSIS — M7731 Calcaneal spur, right foot: Secondary | ICD-10-CM | POA: Diagnosis not present

## 2022-10-12 DIAGNOSIS — B351 Tinea unguium: Secondary | ICD-10-CM | POA: Diagnosis not present

## 2022-10-12 DIAGNOSIS — M7732 Calcaneal spur, left foot: Secondary | ICD-10-CM | POA: Diagnosis not present

## 2022-10-18 ENCOUNTER — Other Ambulatory Visit: Payer: Medicare Other

## 2022-10-18 DIAGNOSIS — I10 Essential (primary) hypertension: Secondary | ICD-10-CM

## 2022-10-18 DIAGNOSIS — N400 Enlarged prostate without lower urinary tract symptoms: Secondary | ICD-10-CM

## 2022-10-18 DIAGNOSIS — Z91014 Allergy to mammalian meats: Secondary | ICD-10-CM

## 2022-10-18 DIAGNOSIS — R42 Dizziness and giddiness: Secondary | ICD-10-CM

## 2022-10-18 DIAGNOSIS — E785 Hyperlipidemia, unspecified: Secondary | ICD-10-CM

## 2022-10-19 LAB — COMPLETE METABOLIC PANEL WITH GFR
AG Ratio: 1.8 (calc) (ref 1.0–2.5)
ALT: 20 U/L (ref 9–46)
AST: 22 U/L (ref 10–35)
Albumin: 4.2 g/dL (ref 3.6–5.1)
Alkaline phosphatase (APISO): 67 U/L (ref 35–144)
BUN: 18 mg/dL (ref 7–25)
CO2: 25 mmol/L (ref 20–32)
Calcium: 9.3 mg/dL (ref 8.6–10.3)
Chloride: 105 mmol/L (ref 98–110)
Creat: 0.94 mg/dL (ref 0.70–1.22)
Globulin: 2.4 g/dL (calc) (ref 1.9–3.7)
Glucose, Bld: 99 mg/dL (ref 65–99)
Potassium: 4.2 mmol/L (ref 3.5–5.3)
Sodium: 141 mmol/L (ref 135–146)
Total Bilirubin: 0.8 mg/dL (ref 0.2–1.2)
Total Protein: 6.6 g/dL (ref 6.1–8.1)
eGFR: 81 mL/min/{1.73_m2} (ref >=60)

## 2022-10-19 LAB — CBC WITH DIFFERENTIAL/PLATELET
Absolute Monocytes: 546 cells/uL (ref 200–950)
Basophils Absolute: 48 cells/uL (ref 0–200)
Basophils Relative: 0.9 %
Eosinophils Absolute: 127 cells/uL (ref 15–500)
Eosinophils Relative: 2.4 %
HCT: 47.4 % (ref 38.5–50.0)
Hemoglobin: 16.7 g/dL (ref 13.2–17.1)
Lymphs Abs: 1659 cells/uL (ref 850–3900)
MCH: 31.9 pg (ref 27.0–33.0)
MCHC: 35.2 g/dL (ref 32.0–36.0)
MCV: 90.5 fL (ref 80.0–100.0)
MPV: 9.3 fL (ref 7.5–12.5)
Monocytes Relative: 10.3 %
Neutro Abs: 2920 cells/uL (ref 1500–7800)
Neutrophils Relative %: 55.1 %
Platelets: 178 10*3/uL (ref 140–400)
RBC: 5.24 10*6/uL (ref 4.20–5.80)
RDW: 12.8 % (ref 11.0–15.0)
Total Lymphocyte: 31.3 %
WBC: 5.3 10*3/uL (ref 3.8–10.8)

## 2022-10-19 LAB — LIPID PANEL
Cholesterol: 190 mg/dL (ref <200)
HDL: 36 mg/dL — ABNORMAL LOW (ref >=40)
LDL Cholesterol (Calc): 108 mg/dL (calc) — ABNORMAL HIGH
Non-HDL Cholesterol (Calc): 154 mg/dL (calc) — ABNORMAL HIGH (ref <130)
Total CHOL/HDL Ratio: 5.3 (calc) — ABNORMAL HIGH (ref <5.0)
Triglycerides: 343 mg/dL — ABNORMAL HIGH (ref <150)

## 2022-10-19 LAB — PSA: PSA: 1.1 ng/mL (ref <=4.00)

## 2022-10-21 ENCOUNTER — Other Ambulatory Visit (HOSPITAL_COMMUNITY): Payer: Self-pay

## 2022-10-21 ENCOUNTER — Encounter: Payer: Self-pay | Admitting: Family Medicine

## 2022-10-21 ENCOUNTER — Ambulatory Visit (INDEPENDENT_AMBULATORY_CARE_PROVIDER_SITE_OTHER): Payer: Medicare Other | Admitting: Family Medicine

## 2022-10-21 VITALS — BP 128/80 | HR 75 | Temp 97.7°F | Ht 71.0 in | Wt 194.0 lb

## 2022-10-21 DIAGNOSIS — Z0000 Encounter for general adult medical examination without abnormal findings: Secondary | ICD-10-CM

## 2022-10-21 DIAGNOSIS — I10 Essential (primary) hypertension: Secondary | ICD-10-CM | POA: Diagnosis not present

## 2022-10-21 DIAGNOSIS — Z0001 Encounter for general adult medical examination with abnormal findings: Secondary | ICD-10-CM | POA: Diagnosis not present

## 2022-10-21 LAB — ALPHA-GAL PANEL
Allergen, Mutton, f88: <0.1 kU/L
Allergen, Pork, f26: <0.1 kU/L
Beef: <0.1 kU/L
CLASS: 0
CLASS: 0
Class: 0
GALACTOSE-ALPHA-1,3-GALACTOSE IGE*: 0.42 kU/L — ABNORMAL HIGH (ref <0.10)

## 2022-10-21 LAB — INTERPRETATION:

## 2022-10-21 MED ORDER — VASCEPA 1 G PO CAPS
2.0000 g | ORAL_CAPSULE | Freq: Two times a day (BID) | ORAL | 11 refills | Status: DC
  Filled 2022-10-21: qty 120, 30d supply, fill #0

## 2022-10-21 NOTE — Progress Notes (Signed)
Subjective:    Patient ID: John Evans, male    DOB: 07-10-1941, 82 y.o.   MRN: [id number]  Patient is an 82 year old Caucasian gentleman here today for complete physical exam.  He is requesting that we recheck lab work for alpha gal.  He has a history of alpha gal allergy and he is questioning if his allergic reaction Spaete is so that he can start expanding his diet.  Due to his age he is not due for any colon cancer screening.  Reviewing his shots, his immunizations are up-to-date except for Shingrix and a COVID booster.  I recommended both of those to the patient.  Otherwise his review of systems is negative.  He denies any falls.  He denies any memory loss.  He denies any depression.  He denies any chest pain shortness of breath or dyspnea on exertion. Past Medical History:  Diagnosis Date   BPH (benign prostatic hyperplasia)    Gout    Hyperlipidemia    Patient reportedly had a coronary calcium score of 21 last year I cannot find his report   Hypertension    Insomnia    Obstructive sleep apnea    Panic attacks    Past Surgical History:  Procedure Laterality Date   BACK SURGERY     EYE SURGERY     Current Outpatient Medications on File Prior to Visit  Medication Sig Dispense Refill   ALPRAZolam (XANAX) 0.5 MG tablet Take 1 tablet by mouth at bedtime as needed. 30 tablet 0   febuxostat (ULORIC) 40 MG tablet Take 1 tablet (40 mg total) by mouth daily. 90 tablet 3   finasteride (PROSCAR) 5 MG tablet Take 1 tablet (5 mg total) by mouth daily. 90 tablet 3   No current facility-administered medications on file prior to visit.   Allergies  Allergen Reactions   Latex Rash   Social History   Socioeconomic History   Marital status: Married    Spouse name: Remo Lipps   Number of children: 2   Years of education: Not on file   Highest education level: Not on file  Occupational History   Occupation: 2  Tobacco Use   Smoking status: Never   Smokeless tobacco: Never  Substance  and Sexual Activity   Alcohol use: No   Drug use: No   Sexual activity: Yes    Comment: married, retired Software engineer  Other Topics Concern   Not on file  Social History Narrative   Married x 55 years.    4 grandchildren   Social Determinants of Health   Financial Resource Strain: Low Risk  (08/26/2021)   Overall Financial Resource Strain (CARDIA)    Difficulty of Paying Living Expenses: Not hard at all  Food Insecurity: No Food Insecurity (08/26/2021)   Hunger Vital Sign    Worried About Running Out of Food in the Last Year: Never true    Ran Out of Food in the Last Year: Never true  Transportation Needs: No Transportation Needs (08/26/2021)   PRAPARE - Hydrologist (Medical): No    Lack of Transportation (Non-Medical): No  Physical Activity: Inactive (08/26/2021)   Exercise Vital Sign    Days of Exercise per Week: 0 days    Minutes of Exercise per Session: 0 min  Stress: No Stress Concern Present (08/26/2021)   [place]    Feeling of Stress : Not at all  Social Connections: [place] (08/26/2021)  Social Licensed conveyancer [NHANES]    Frequency of Communication with Friends and Family: More than three times a week    Frequency of Social Gatherings with Friends and Family: More than three times a week    Attends Religious Services: More than 4 times per year    Active Member of [employer] or Organizations: Yes    Attends Music therapist: More than 4 times per year    Marital Status: Married  Human resources officer Violence: Not At Risk (08/26/2021)   Humiliation, Afraid, Rape, and Kick questionnaire    Fear of Current or Ex-Partner: No    Emotionally Abused: No    Physically Abused: No    Sexually Abused: No     Review of Systems  Cardiovascular:  Positive for chest pain.  Neurological:  Positive for headaches.  All other systems reviewed and are  negative.      Objective:   Physical Exam Vitals reviewed.  Constitutional:      General: He is not in acute distress.    Appearance: Normal appearance. He is well-developed and normal weight. He is not ill-appearing or toxic-appearing.  HENT:     Head: Normocephalic and atraumatic.     Right Ear: Tympanic membrane and ear canal normal.     Left Ear: Tympanic membrane and ear canal normal.     Nose: No congestion or rhinorrhea.     Mouth/Throat:     Mouth: Mucous membranes are moist.     Pharynx: No oropharyngeal exudate or posterior oropharyngeal erythema.  Eyes:     Extraocular Movements: Extraocular movements intact.     Conjunctiva/sclera: Conjunctivae normal.     Pupils: Pupils are equal, round, and reactive to light.  Neck:     Vascular: No carotid bruit.  Cardiovascular:     Rate and Rhythm: Normal rate and regular rhythm.     Heart sounds: Normal heart sounds. No murmur heard.    No friction rub. No gallop.  Pulmonary:     Effort: Pulmonary effort is normal. No respiratory distress.     Breath sounds: Normal breath sounds. No stridor. No wheezing, rhonchi or rales.  Abdominal:     General: Bowel sounds are normal. There is no distension.     Palpations: Abdomen is soft. There is no mass.     Tenderness: There is no abdominal tenderness. There is no guarding or rebound.     Hernia: No hernia is present.  Musculoskeletal:        General: Normal range of motion.     Cervical back: Normal range of motion and neck supple. No rigidity.     Right lower leg: No edema.     Left lower leg: No edema.  Lymphadenopathy:     Cervical: No cervical adenopathy.  Neurological:     General: No focal deficit present.     Mental Status: He is alert and oriented to person, place, and time. Mental status is at baseline.     Cranial Nerves: No cranial nerve deficit.     Sensory: No sensory deficit.     Motor: No weakness.     Coordination: Coordination normal.     Gait: Gait normal.      Deep Tendon Reflexes: Reflexes normal.  Psychiatric:        Mood and Affect: Mood normal.        Behavior: Behavior normal.        Thought Content: Thought content normal.  Judgment: Judgment normal.           Assessment & Plan:  Encounter for Medicare annual wellness exam  Benign essential HTN Physical exam today is completely normal.  I reviewed his lab work with the patient.   Lab on 10/18/2022  Component Date Value Ref Range Status   WBC 10/18/2022 5.3  3.8 - 10.8 Thousand/uL Final   RBC 10/18/2022 5.24  4.20 - 5.80 Million/uL Final   Hemoglobin 10/18/2022 16.7  13.2 - 17.1 g/dL Final   HCT 10/18/2022 47.4  38.5 - 50.0 % Final   MCV 10/18/2022 90.5  80.0 - 100.0 fL Final   MCH 10/18/2022 31.9  27.0 - 33.0 pg Final   MCHC 10/18/2022 35.2  32.0 - 36.0 g/dL Final   RDW 10/18/2022 12.8  11.0 - 15.0 % Final   Platelets 10/18/2022 178  140 - 400 Thousand/uL Final   MPV 10/18/2022 9.3  7.5 - 12.5 fL Final   Neutro Abs 10/18/2022 2,920  1,500 - 7,800 cells/uL Final   Lymphs Abs 10/18/2022 1,659  850 - 3,900 cells/uL Final   Absolute Monocytes 10/18/2022 546  200 - 950 cells/uL Final   Eosinophils Absolute 10/18/2022 127  15 - 500 cells/uL Final   Basophils Absolute 10/18/2022 48  0 - 200 cells/uL Final   Neutrophils Relative % 10/18/2022 55.1  % Final   Total Lymphocyte 10/18/2022 31.3  % Final   Monocytes Relative 10/18/2022 10.3  % Final   Eosinophils Relative 10/18/2022 2.4  % Final   Basophils Relative 10/18/2022 0.9  % Final   Glucose, Bld 10/18/2022 99  65 - 99 mg/dL Final   Comment: .            Fasting reference interval .    BUN 10/18/2022 18  7 - 25 mg/dL Final   Creat 10/18/2022 0.94  0.70 - 1.22 mg/dL Final   eGFR 10/18/2022 81  > OR = 60 mL/min/1.59m Final   BUN/Creatinine Ratio 10/18/2022 SEE NOTE:  6 - 22 (calc) Final   Comment:    Not Reported: BUN and Creatinine are within    reference range. .    Sodium 10/18/2022 141  135 - 146 mmol/L  Final   Potassium 10/18/2022 4.2  3.5 - 5.3 mmol/L Final   Chloride 10/18/2022 105  98 - 110 mmol/L Final   CO2 10/18/2022 25  20 - 32 mmol/L Final   Calcium 10/18/2022 9.3  8.6 - 10.3 mg/dL Final   Total Protein 10/18/2022 6.6  6.1 - 8.1 g/dL Final   Albumin 10/18/2022 4.2  3.6 - 5.1 g/dL Final   Globulin 10/18/2022 2.4  1.9 - 3.7 g/dL (calc) Final   AG Ratio 10/18/2022 1.8  1.0 - 2.5 (calc) Final   Total Bilirubin 10/18/2022 0.8  0.2 - 1.2 mg/dL Final   Alkaline phosphatase (APISO) 10/18/2022 67  35 - 144 U/L Final   AST 10/18/2022 22  10 - 35 U/L Final   ALT 10/18/2022 20  9 - 46 U/L Final   Cholesterol 10/18/2022 190  <200 mg/dL Final   HDL 10/18/2022 36 (L)  > OR = 40 mg/dL Final   Triglycerides 10/18/2022 343 (H)  <150 mg/dL Final   Comment: . If a non-fasting specimen was collected, consider repeat triglyceride testing on a fasting specimen if clinically indicated.  JYates Decampet al. J. of Clin. Lipidol. [id number] .Marland Kitchen   LDL Cholesterol (Calc) 10/18/2022 108 (H)  mg/dL (calc) Final   Comment: Reference range: <  100 . Desirable range <100 mg/dL for primary prevention;   <70 mg/dL for patients with CHD or diabetic patients  with > or = 2 CHD risk factors. Marland Kitchen LDL-C is now calculated using the Martin-Hopkins  calculation, which is a validated novel method providing  better accuracy than the Friedewald equation in the  estimation of LDL-C.  Cresenciano Genre et al. Annamaria Helling. [id number]): 2061-2068  (http://education.[facility].com/faq/FAQ164)    Total CHOL/HDL Ratio 10/18/2022 5.3 (H)  <5.0 (calc) Final   Non-HDL Cholesterol (Calc) 10/18/2022 154 (H)  <130 mg/dL (calc) Final   Comment: For patients with diabetes plus 1 major ASCVD risk  factor, treating to a non-HDL-C goal of <100 mg/dL  (LDL-C of <70 mg/dL) is considered a therapeutic  option.    PSA 10/18/2022 1.10  < OR = 4.00 ng/mL Final   Comment: The total PSA value from this assay system is  standardized against the  WHO standard. The test  result will be approximately 20% lower when compared  to the equimolar-standardized total PSA (Beckman  Coulter). Comparison of serial PSA results should be  interpreted with this fact in mind. . This test was performed using the Siemens  chemiluminescent method. Values obtained from  different assay methods cannot be used interchangeably. PSA levels, regardless of value, should not be interpreted as absolute evidence of the presence or absence of disease.    Beef 10/18/2022 <0.10  kU/L Final   CLASS 10/18/2022 0   Final   Allergen, Mutton, f88 10/18/2022 <0.10  kU/L Final   Class 10/18/2022 0   Final   Allergen, Pork, f26 10/18/2022 <0.10  kU/L Final   CLASS 10/18/2022 0   Final   Interpretation 10/18/2022    Final   Comment: . Specific                        Level of Allergen IGE Class      kU/L             Specific IGE Antibody  -----         ---------        -------------------   0              <0.10           Absent/Undetectable   0/1        0.10-0.34           Very Low Level   1          0.35-0.69           Low Level   2          0.70-3.49           Moderate Level   3          3.50-17.4           High Level   4          17.5-49.9           Very High Level   5            50-100            Very High Level   6              >100            Very High Level . The clinical relevance of allergen results of 0.10-0.34 kU/L are undetermined and intended for  specialist use. Marland Kitchen  Allergens denoted with a "**" include results using one or more analyte specific reagents. In those cases, the test was developed and its analytical performance characteristics have been determined by [employer]. It has not been cleared or approved by the U.S. Food and Drug Administration. This assay  has been v                          alidated pursuant to the [employer]  and is used for clinical purposes.    Cholesterol is elevated.  Together we decided to add  fish oil 2000 mg twice daily and recheck in 6 months.  The remainder of his lab work is outstanding.  Patient does not require another colonoscopy.  PSA is normal.  Strongly encouraged him to get Shingrix as well as a COVID booster.

## 2022-10-24 ENCOUNTER — Telehealth: Payer: Self-pay

## 2022-10-24 NOTE — Telephone Encounter (Signed)
Pt called for referral for PT for L heal pain from bone spurs. Pt states he mention during most recent appt.   Belinda Block [phone]

## 2022-10-25 ENCOUNTER — Other Ambulatory Visit: Payer: Self-pay | Admitting: Family Medicine

## 2022-10-25 DIAGNOSIS — M79673 Pain in unspecified foot: Secondary | ICD-10-CM

## 2022-10-26 ENCOUNTER — Telehealth: Payer: Self-pay

## 2022-10-26 NOTE — Telephone Encounter (Signed)
Pt came by and asked if he could be referred to:  [facility] in [place] for recurrent heel spur [phone] [address]    Dr. Nicki Reaper P Commins, Allergist that specializes in tx of Alpha Gal [place] [phone]  Thank you.

## 2022-10-27 ENCOUNTER — Other Ambulatory Visit: Payer: Self-pay | Admitting: Family Medicine

## 2022-10-27 ENCOUNTER — Encounter: Payer: Medicare Other | Admitting: Family Medicine

## 2022-10-27 DIAGNOSIS — T781XXA Other adverse food reactions, not elsewhere classified, initial encounter: Secondary | ICD-10-CM

## 2022-10-27 DIAGNOSIS — M722 Plantar fascial fibromatosis: Secondary | ICD-10-CM

## 2022-10-27 NOTE — Progress Notes (Signed)
Allergy

## 2022-10-28 ENCOUNTER — Other Ambulatory Visit (HOSPITAL_COMMUNITY): Payer: Self-pay

## 2022-11-14 DIAGNOSIS — M79672 Pain in left foot: Secondary | ICD-10-CM | POA: Diagnosis not present

## 2022-11-16 DIAGNOSIS — Z91018 Allergy to other foods: Secondary | ICD-10-CM | POA: Diagnosis not present

## 2022-11-16 DIAGNOSIS — K5229 Other allergic and dietetic gastroenteritis and colitis: Secondary | ICD-10-CM | POA: Diagnosis not present

## 2022-11-16 DIAGNOSIS — Z91014 Allergy to mammalian meats: Secondary | ICD-10-CM | POA: Diagnosis not present

## 2022-11-21 DIAGNOSIS — M79672 Pain in left foot: Secondary | ICD-10-CM | POA: Diagnosis not present

## 2022-11-25 ENCOUNTER — Other Ambulatory Visit (HOSPITAL_COMMUNITY): Payer: Self-pay

## 2022-11-25 DIAGNOSIS — M722 Plantar fascial fibromatosis: Secondary | ICD-10-CM | POA: Diagnosis not present

## 2022-11-25 MED ORDER — DICLOFENAC SODIUM 1 % EX GEL
2.0000 g | Freq: Four times a day (QID) | CUTANEOUS | 2 refills | Status: DC
  Filled 2022-11-25: qty 100, 12d supply, fill #0

## 2022-11-25 MED ORDER — MELOXICAM 7.5 MG PO TABS
7.5000 mg | ORAL_TABLET | Freq: Every day | ORAL | 0 refills | Status: DC | PRN
  Filled 2022-11-25: qty 30, 30d supply, fill #0

## 2022-11-30 ENCOUNTER — Other Ambulatory Visit (HOSPITAL_COMMUNITY): Payer: Self-pay

## 2022-12-01 ENCOUNTER — Other Ambulatory Visit (HOSPITAL_BASED_OUTPATIENT_CLINIC_OR_DEPARTMENT_OTHER): Payer: Self-pay

## 2022-12-01 DIAGNOSIS — M79672 Pain in left foot: Secondary | ICD-10-CM | POA: Diagnosis not present

## 2022-12-01 MED ORDER — SHINGRIX 50 MCG/0.5ML IM SUSR
0.5000 mL | INTRAMUSCULAR | 1 refills | Status: DC
  Filled 2022-12-01: qty 0.5, 1d supply, fill #0

## 2022-12-06 ENCOUNTER — Other Ambulatory Visit (HOSPITAL_COMMUNITY): Payer: Self-pay

## 2022-12-06 DIAGNOSIS — L821 Other seborrheic keratosis: Secondary | ICD-10-CM | POA: Diagnosis not present

## 2022-12-06 DIAGNOSIS — C44719 Basal cell carcinoma of skin of left lower limb, including hip: Secondary | ICD-10-CM | POA: Diagnosis not present

## 2022-12-06 DIAGNOSIS — D225 Melanocytic nevi of trunk: Secondary | ICD-10-CM | POA: Diagnosis not present

## 2022-12-06 DIAGNOSIS — L812 Freckles: Secondary | ICD-10-CM | POA: Diagnosis not present

## 2022-12-06 DIAGNOSIS — L570 Actinic keratosis: Secondary | ICD-10-CM | POA: Diagnosis not present

## 2022-12-06 DIAGNOSIS — Z85828 Personal history of other malignant neoplasm of skin: Secondary | ICD-10-CM | POA: Diagnosis not present

## 2022-12-06 DIAGNOSIS — D1801 Hemangioma of skin and subcutaneous tissue: Secondary | ICD-10-CM | POA: Diagnosis not present

## 2022-12-06 DIAGNOSIS — D485 Neoplasm of uncertain behavior of skin: Secondary | ICD-10-CM | POA: Diagnosis not present

## 2022-12-06 MED ORDER — FLUOROURACIL 5 % EX CREA
TOPICAL_CREAM | CUTANEOUS | 0 refills | Status: DC
  Filled 2022-12-06: qty 40, 30d supply, fill #0

## 2022-12-09 DIAGNOSIS — M79672 Pain in left foot: Secondary | ICD-10-CM | POA: Diagnosis not present

## 2022-12-15 ENCOUNTER — Encounter: Payer: Self-pay | Admitting: Physician Assistant

## 2022-12-15 ENCOUNTER — Ambulatory Visit: Payer: Medicare Other | Admitting: Physician Assistant

## 2022-12-15 ENCOUNTER — Other Ambulatory Visit (INDEPENDENT_AMBULATORY_CARE_PROVIDER_SITE_OTHER): Payer: Medicare Other

## 2022-12-15 DIAGNOSIS — M7662 Achilles tendinitis, left leg: Secondary | ICD-10-CM | POA: Insufficient documentation

## 2022-12-15 DIAGNOSIS — M79671 Pain in right foot: Secondary | ICD-10-CM

## 2022-12-15 DIAGNOSIS — M1A00X0 Idiopathic chronic gout, unspecified site, without tophus (tophi): Secondary | ICD-10-CM | POA: Diagnosis not present

## 2022-12-15 NOTE — Progress Notes (Signed)
Office Visit Note   Patient: John Evans           Date of Birth: Aug 07, 1941           MRN: [id number] Visit Date: 12/15/2022              Requested by: John Brooks, MD [address] [place],  [place] [address] PCP: John Brooks, MD   Assessment & Plan: Visit Diagnoses:  1. Pain in right foot   2. Idiopathic chronic gout without tophus, unspecified site   3. 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  He presents today for 2 different problems on his feet.  On the right foot he has a history of gout.  He takes Uloric.  He has trouble tolerating the colchicine.  It has always been present in his fourth toe.  His last uric acid that I see was just over 7.  He is concerned because the last couple days his whole foot is seem to be red.  Denies any injuries.  Denies any fever chills or malaise.  Looking back he has had color changes and gout in the fourth toe with erosive changes consistent with gout going back to 2010.  I do not think the x-rays today are much worse.  I do not see any evidence of osteomyelitis.  He does have some redness on his fourth toe and he said this is actually baseline and improves with elevation.  That being said I will draw some labs to rule out infection.  Will also draw a uric acid.  With regards to the left foot he presents with Achilles and plantar fasciitis.  Been going on about 6 weeks he is using Hoka shoes and has been doing physical therapy.  Has not really had much relief with regards to the insertional Achilles tendinitis.  His physical therapy suggested a shot ".  I told him this is not what I would recommend.  If he is really painful and interested I would refer him to Dr. Shon Evans to see if he be a candidate for shock therapy of the left distal Achilles insertion.  He said he would like to try this.  He understands this is not covered by insurance.     It  is the redness a Follow-Up Instructions: No follow-ups on file.   Orders:  Orders Placed This Encounter  Procedures   XR Foot Complete Right   CBC with Differential/Platelet   Sedimentation rate   C-reactive protein   Uric acid   AMB referral to sports medicine   No orders of the defined types were placed in this encounter.     Procedures: No procedures performed   Clinical Data: No additional findings.   Subjective: Chief Complaint  Patient presents with   Right Foot - Pain   Left Foot - Pain    HPI Mr. John Evans is a pleasant 82 year old gentleman who comes in for different issues on his bilateral feet.  On his left foot he has been followed and getting physical therapy for Achilles insertional tendinitis and plantar fasciitis.  He still has pain with the Achilles insertional tendinitis.  Rates it as moderate.  He says this has been going on for about 6 weeks.  On the right side he has been diagnosed with a history of gout.  He does take Uloric.  His last uric acid  he said was fine.  He has chronic swelling and erythema of his fourth toe.  Surgery was proposed for him but he did not want to do this.  Review of Systems  All other systems reviewed and are negative.    Objective: Vital Signs: There were no vitals taken for this visit.  Physical Exam Constitutional:      Appearance: Normal appearance.  Pulmonary:     Effort: Pulmonary effort is normal.  Musculoskeletal:        General: Normal range of motion.  Neurological:     General: No focal deficit present.     Mental Status: He is alert.  Psychiatric:        Mood and Affect: Mood normal.        Behavior: Behavior normal.     Ortho Exam Right foot he has mild to moderate soft tissue swelling no induration but does have some erythema.  His pulses are intact toes are warm and pink with brisk capillary refill he does have discoloration of the fourth toe especially the middle distal phalanx.  No ascending  cellulitis is noted.  He has good function of his foot foot hurts on the lateral side. Left foot pulses intact no redness no erythema good inversion and eversion he does have some tightness with dorsiflexion and recreation of pain over the retrocalcaneal bursa and distal Achilles insertion no real pain over the plantar fascia insertion Specialty Comments:  No specialty comments available.  Imaging: No results found.   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   Elevated prostate specific antigen (PSA) 10/14/2013   Urinary urgency 10/14/2013   BPH (benign prostatic hyperplasia)    Epiretinal membrane, left 07/28/2011   Nuclear sclerotic cataract, right 07/28/2011   Presence of intraocular lens 07/28/2011   Viral conjunctivitis 07/28/2011   Epiretinal membrane 06/03/2011   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   Visual field defect 06/03/2011   Hypertension    HYPERLIPIDEMIA TYPE I / IV 04/14/2009   DYSPNEA 04/14/2009   TOE PAIN 09/03/2008   INSOMNIA UNSPECIFIED 06/13/2008   HYPERTROPHY PROSTATE W/O UR OBST & OTH LUTS 07/05/2007   Hyperlipemia 10/12/2006   HYPERTENSION, BENIGN SYSTEMIC 10/12/2006   Hyperplasia of prostate 10/12/2006   Past Medical History:  Diagnosis Date   BPH (benign prostatic hyperplasia)    Gout    Hyperlipidemia    Patient reportedly had a coronary calcium score of 21 last year I cannot find his report   Hypertension    Insomnia     Obstructive sleep apnea    Panic attacks     Family History  Problem Relation Age of Onset   Cancer Father        throat   Esophageal cancer Father    Diabetes Paternal Grandmother    Colon cancer Neg Hx     Past Surgical History:  Procedure Laterality Date   BACK SURGERY     EYE SURGERY     Social History  Occupational History   Occupation: 2  Tobacco Use   Smoking status: Never   Smokeless tobacco: Never  Substance and Sexual Activity   Alcohol use: No   Drug use: No   Sexual activity: Yes    Comment: married, retired Teacher, early years/pre

## 2022-12-16 ENCOUNTER — Other Ambulatory Visit (HOSPITAL_COMMUNITY): Payer: Self-pay

## 2022-12-16 ENCOUNTER — Other Ambulatory Visit: Payer: Self-pay | Admitting: Physician Assistant

## 2022-12-16 DIAGNOSIS — Z91018 Allergy to other foods: Secondary | ICD-10-CM | POA: Insufficient documentation

## 2022-12-16 LAB — CBC WITH DIFFERENTIAL/PLATELET
Absolute Monocytes: 843 cells/uL (ref 200–950)
Basophils Absolute: 69 cells/uL (ref 0–200)
Basophils Relative: 0.7 %
Eosinophils Absolute: 98 cells/uL (ref 15–500)
Eosinophils Relative: 1 %
HCT: 47.3 % (ref 38.5–50.0)
Hemoglobin: 16.5 g/dL (ref 13.2–17.1)
Lymphs Abs: 1607 cells/uL (ref 850–3900)
MCH: 31.3 pg (ref 27.0–33.0)
MCHC: 34.9 g/dL (ref 32.0–36.0)
MCV: 89.6 fL (ref 80.0–100.0)
MPV: 9.9 fL (ref 7.5–12.5)
Monocytes Relative: 8.6 %
Neutro Abs: 7183 cells/uL (ref 1500–7800)
Neutrophils Relative %: 73.3 %
Platelets: 243 10*3/uL (ref 140–400)
RBC: 5.28 10*6/uL (ref 4.20–5.80)
RDW: 12.8 % (ref 11.0–15.0)
Total Lymphocyte: 16.4 %
WBC: 9.8 10*3/uL (ref 3.8–10.8)

## 2022-12-16 LAB — URIC ACID: Uric Acid, Serum: 3.7 mg/dL — ABNORMAL LOW (ref 4.0–8.0)

## 2022-12-16 LAB — SEDIMENTATION RATE: Sed Rate: 14 mm/h (ref 0–20)

## 2022-12-16 LAB — C-REACTIVE PROTEIN: CRP: 14 mg/L — ABNORMAL HIGH (ref <8.0)

## 2022-12-16 MED ORDER — CEPHALEXIN 500 MG PO CAPS
500.0000 mg | ORAL_CAPSULE | Freq: Four times a day (QID) | ORAL | 0 refills | Status: DC
  Filled 2022-12-16 (×2): qty 28, 7d supply, fill #0

## 2022-12-20 ENCOUNTER — Ambulatory Visit: Payer: Medicare Other | Admitting: Sports Medicine

## 2022-12-20 ENCOUNTER — Encounter: Payer: Self-pay | Admitting: Sports Medicine

## 2022-12-20 ENCOUNTER — Other Ambulatory Visit (HOSPITAL_COMMUNITY): Payer: Self-pay

## 2022-12-20 DIAGNOSIS — M7662 Achilles tendinitis, left leg: Secondary | ICD-10-CM | POA: Diagnosis not present

## 2022-12-20 DIAGNOSIS — M79671 Pain in right foot: Secondary | ICD-10-CM | POA: Diagnosis not present

## 2022-12-20 DIAGNOSIS — M7752 Other enthesopathy of left foot: Secondary | ICD-10-CM | POA: Diagnosis not present

## 2022-12-20 MED ORDER — METHYLPREDNISOLONE 4 MG PO TBPK
ORAL_TABLET | ORAL | 0 refills | Status: DC
  Filled 2022-12-20: qty 21, 6d supply, fill #0

## 2022-12-20 NOTE — Progress Notes (Signed)
John Evans - 82 y.o. male MRN [id number]  Date of birth: 07-02-1941  Office Visit Note: Visit Date: 12/20/2022 PCP: Donita Adalynne Steffensmeier, MD Referred by: Donita Corianna Avallone, MD  Subjective: Chief Complaint  Patient presents with   Left Heel - Pain   HPI: John Evans is a very pleasant 82 y.o. 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  He has been dealing with heel and distal Achilles insertion pain on the left side for about 6-7 weeks now.  Taking ibuprofen very occasionally.  He has done physical therapy with only mild relief.  Denies having any gout in this region.  His right fourth toe has a chronic history of gout with degradation of the joint.  He continues on Uloric. Lab review from 12/15/2022 showed a uric acid of 3.7, was 7.6 about 7 months prior.  He did have an elevated CRP at 14.0.  CBC was obtained with normal white blood cell count at 9.8. Chart review does show that he was sent in a prescription for Keflex by my partner, [place].  Pertinent ROS were reviewed with the patient and found to be negative unless otherwise specified above in HPI.   Assessment & Plan: Visit Diagnoses:  1. Tendonitis, Achilles, left   2. Retrocalcaneal bursitis (back of heel), left   3. Pain in right foot    Plan: Discussed with Jabreel treatment options for his distal Achilles and calcaneal pathology.  I do think he is doing more so with a degree of retrocalcaneal bursitis with superimposed Achilles tendinitis.  We did proceed with a trial of extracorporeal shockwave therapy, patient tolerated well.  Due to the inflammation.  As well as at the right toe, we will proceed with a methylprednisolone taper course for 6 days.  He will ice and continue in the heel cups as well.  He is going on vacation, but when he returns in 2 weeks we will see him back and consider second ESWT treatment.   We discussed proceeding with ultrasound to evaluate further bursa versus the quality of the Achilles tendon for guidance on treatment therapy.  He is agreeable with this plan.  He will continue his Uloric for history of gout.  Follow-up: Return in about 2 weeks (around 01/03/2023) for 30-mins for [place] heel and ESWT.   Meds & Orders:  Meds ordered this encounter  Medications   methylPREDNISolone (MEDROL DOSEPAK) 4 MG TBPK tablet    Sig: Take per packet instructions. Taper dosing.    Dispense:  21 tablet    Refill:  0   No orders of the defined types were placed in this encounter.    Procedures: No procedures performed      Clinical History: No specialty comments available.  He reports that he has never smoked. He has never used smokeless tobacco.  Recent Labs    05/13/22 1538 12/15/22 1455  LABURIC 7.6 3.7*    Objective:   Vital Signs: There were no vitals taken for this visit.  Physical Exam  Gen: Well-appearing, in no acute distress; non-toxic CV: Well-perfused. Warm.  Resp: Breathing unlabored on room air; no wheezing. Psych: Fluid speech in conversation; appropriate affect; normal thought process Neuro: Sensation intact throughout. No gross coordination deficits.   Ortho Exam - Left foot/ankle: There is some mild swelling and warmth near the retrocalcaneal bursa region.  Mild TTP at  the distal Achilles insertion and over the posterior aspect of the calcaneus.  Negative heel squeeze.  No tenderness near the plantar fascia insertion.  He has full range of motion with dorsiflexion and plantarflexion.   - Right foot: There is fourth toe does have some redness and swelling, not significantly warm to the touch.  Able to move all 5 toes without difficulty.  Imaging: *Independent review of three-view foot x-ray from 12/15/2022 was independently interpreted by myself.  X-rays demonstrate an intact mortise and subtalar joint.  There is significant erosive change of the distal phalanx  of the fourth toe.  Small plantar calcaneal spur.  There is rather significant calcaneal enthesophytes at the distal Achilles insertion.  XR Foot Complete Right Three-view radiographs of his foot demonstrate well-maintained alignment  no acute fractures.  No findings consistent with osteomyelitis.  He does  have quite a bit of erosive change at the distal phalanx of the fourth  toe.  This is also present on previous x-rays 2 years ago and 14 years  ago.  Has progressed slightly.  He also has calcifications on the inferior  and posterior surface of the calcaneus.    Past Medical/Family/Surgical/Social History: Medications & Allergies reviewed per EMR, new medications updated. 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   Elevated prostate specific antigen (PSA) 10/14/2013   Urinary urgency 10/14/2013   BPH (benign prostatic hyperplasia)    Epiretinal membrane, left 07/28/2011   Nuclear sclerotic cataract, right 07/28/2011   Presence of intraocular lens 07/28/2011   Viral conjunctivitis 07/28/2011   Epiretinal membrane 06/03/2011   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   Visual field defect 06/03/2011   Hypertension    HYPERLIPIDEMIA TYPE I / IV 04/14/2009   DYSPNEA 04/14/2009   TOE PAIN 09/03/2008   INSOMNIA UNSPECIFIED 06/13/2008   HYPERTROPHY PROSTATE W/O UR OBST & OTH LUTS 07/05/2007   Hyperlipemia 10/12/2006   HYPERTENSION, BENIGN  SYSTEMIC 10/12/2006   Hyperplasia of prostate 10/12/2006   Past Medical History:  Diagnosis Date   BPH (benign prostatic hyperplasia)    Gout    Hyperlipidemia    Patient reportedly had a coronary calcium score of 21 last year I cannot find his report   Hypertension    Insomnia    Obstructive sleep apnea    Panic attacks    Family History  Problem Relation Age of Onset   Cancer Father        throat   Esophageal cancer Father    Diabetes Paternal Grandmother    Colon cancer Neg Hx    Past Surgical History:  Procedure Laterality Date   BACK SURGERY     EYE SURGERY     Social History   Occupational History   Occupation: 2  Tobacco Use   Smoking status: Never   Smokeless tobacco: Never  Substance and Sexual Activity   Alcohol use: No   Drug use: No   Sexual activity: Yes    Comment:  married, retired Teacher, early years/pre

## 2022-12-20 NOTE — Progress Notes (Signed)
Left heel pain for about 6-8 weeks He has done PT for 4 weeks which helps Has an insert for his shoes that he wears

## 2023-01-02 ENCOUNTER — Other Ambulatory Visit (HOSPITAL_COMMUNITY): Payer: Self-pay

## 2023-01-03 ENCOUNTER — Other Ambulatory Visit: Payer: Self-pay

## 2023-01-03 ENCOUNTER — Ambulatory Visit: Payer: Medicare Other | Admitting: Sports Medicine

## 2023-01-03 ENCOUNTER — Encounter: Payer: Self-pay | Admitting: Sports Medicine

## 2023-01-03 DIAGNOSIS — M6528 Calcific tendinitis, other site: Secondary | ICD-10-CM

## 2023-01-03 DIAGNOSIS — M7660 Achilles tendinitis, unspecified leg: Secondary | ICD-10-CM

## 2023-01-03 DIAGNOSIS — M7662 Achilles tendinitis, left leg: Secondary | ICD-10-CM | POA: Diagnosis not present

## 2023-01-03 NOTE — Progress Notes (Signed)
States it is doing better; minimal pain  Feels like the right one is starting to be just as painful

## 2023-01-03 NOTE — Progress Notes (Signed)
John Evans - 82 y.o. male MRN [id number]  Date of birth: 09/08/1940  Office Visit Note: Visit Date: 01/03/2023 PCP: Donita Dosha Broshears, MD Referred by: Donita Danaly Bari, MD  Subjective: Chief Complaint  Patient presents with   Left Leg - Follow-up   HPI: John Evans is a pleasant 82 y.o. male who presents today for follow-up of left achilles tendinopathy.  Last visit we did place him on a methylprednisolone taper pack.  We also did a trial of extracorporeal shockwave treatment.  He states both of these gave him good relief.  He feels like he is 45-50% improved from prior visit.  He did go on vacation in [place] recently.  No new injury.  He has seen a physical therapist in the past, does have an appointment upcoming next week.  Pertinent ROS were reviewed with the patient and found to be negative unless otherwise specified above in HPI.   Assessment & Plan: Visit Diagnoses:  1. 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 Insertional Achilles tendinopathy    Plan: Discussed with Rashi today after our ultrasound in examination this did show that he has distal Achilles calcific tendinitis with superimposed tendinopathy.  There is no evidence of high-grade tearing which is reassuring.  He received good response from the first ESWT as well as his methylprednisolone taper.  His contralateral side has slightly started to bother him which I think is more compensatory pain.  I would like him to get started in physical therapy for Achilles stretching and strengthening, he has an upcoming PT appointment next week.  He will continue in his heel cup or heel lifts bilaterally.  He will follow-up next week for repeat ESWT and reevaluation.  Follow-up: Return in about 1 week (around 01/10/2023) for for achilles (reg visit).   Meds & Orders: No orders of the defined types were placed in this encounter.   Orders Placed This Encounter  Procedures   [place] Extrem Low Left Comp      Procedures: Procedure: ECSWT Indications: Achilles tendinopathy, calcific Achilles tendinitis   Procedure Details Consent: Risks of procedure as well as the alternatives and risks of each were explained to the patient.  Verbal consent for procedure obtained. Time Out: Verified patient identification, verified procedure, site was marked, verified correct patient position. The area was cleaned with alcohol swab.     The left distal Achilles was targeted for Extracorporeal shockwave therapy.    Preset: Achillodynia Power Level: 110 mJ Frequency: 12 Hz Impulse/cycles: 3000 Head size: Regular   Patient tolerated procedure well without immediate complications.         Clinical History: No specialty comments available.  He reports that he has never smoked. He has never used smokeless tobacco.  Recent Labs    05/13/22 1538 12/15/22 1455  LABURIC 7.6 3.7*    Objective:   Vital Signs: There were no vitals taken for this visit.  Physical Exam  Gen: Well-appearing, in no acute distress; non-toxic CV:  Well-perfused. Warm.  Resp: Breathing unlabored on room air; no wheezing. Psych: Fluid speech in conversation; appropriate affect; normal thought process Neuro: Sensation intact throughout. No gross coordination deficits.   Ortho Exam - Left ankle/foot: Mild TTP near the distal insertion of the Achilles and retrocalcaneal bursa region.  No significant swelling.  Negative calcaneal heel squeeze.  He has full range of motion with dorsiflexion and plantarflexion.  Imaging: [place] Extrem Low Left Comp  Result Date: 01/03/2023 Limited musculoskeletal ultrasound  of the left lower extremity, left Achilles and posterior ankle was performed today.  Long axis evaluation shows proper insertion of the distal Achilles on the calcaneus, however there are notable calcifications within the distal insertion.  There is mild to moderate amount of hyperemia noted in this location.  No significant  swelling of the retrocalcaneal bursa.  There is thickening of the distal tendon approximately 0.87 cm in diameter.  The distal insertion does have some hypoechoic change indicative of mild degree of interstitial tearing within the tendinosis.  Scanning the tendon proximally there is no thickening or tearing of the tendon as extends into the calf musculature.  There is some spurring of the superior aspect of the calcaneus.  Plantar fascia appears within normal limits.   Distal Achilles calcific tendinitis with superimposed tendinopathy and mild interstitial tearing     Past Medical/Family/Surgical/Social History: Medications & Allergies reviewed per EMR, new medications updated. 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   Elevated prostate specific antigen (PSA) 10/14/2013   Urinary urgency 10/14/2013   BPH (benign prostatic hyperplasia)    Epiretinal membrane, left 07/28/2011   Nuclear sclerotic cataract, right 07/28/2011   Presence of intraocular lens 07/28/2011   Viral conjunctivitis 07/28/2011   Epiretinal membrane 06/03/2011   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   Visual field defect 06/03/2011   Hypertension    HYPERLIPIDEMIA TYPE I / IV 04/14/2009   DYSPNEA 04/14/2009   TOE PAIN 09/03/2008   INSOMNIA UNSPECIFIED 06/13/2008   HYPERTROPHY PROSTATE W/O UR OBST & OTH LUTS 07/05/2007   Hyperlipemia 10/12/2006   HYPERTENSION, BENIGN  SYSTEMIC 10/12/2006   Hyperplasia of prostate 10/12/2006   Past Medical History:  Diagnosis Date   BPH (benign prostatic hyperplasia)    Gout    Hyperlipidemia    Patient reportedly had a coronary calcium score of 21 last year I cannot find his report   Hypertension    Insomnia    Obstructive sleep apnea    Panic attacks    Family History  Problem Relation Age of Onset   Cancer Father        throat   Esophageal cancer Father    Diabetes Paternal Grandmother    Colon cancer Neg Hx    Past Surgical History:  Procedure Laterality Date   BACK SURGERY     EYE SURGERY     Social History   Occupational History   Occupation: 2  Tobacco Use   Smoking status: Never   Smokeless tobacco: Never  Substance and Sexual Activity   Alcohol use: No   Drug use: No   Sexual activity: Yes    Comment: married, retired Teacher, early years/pre

## 2023-01-10 ENCOUNTER — Ambulatory Visit: Payer: Medicare Other | Admitting: Sports Medicine

## 2023-01-10 ENCOUNTER — Encounter: Payer: Self-pay | Admitting: Sports Medicine

## 2023-01-10 ENCOUNTER — Other Ambulatory Visit (HOSPITAL_COMMUNITY): Payer: Self-pay

## 2023-01-10 DIAGNOSIS — G5793 Unspecified mononeuropathy of bilateral lower limbs: Secondary | ICD-10-CM

## 2023-01-10 DIAGNOSIS — M6528 Calcific tendinitis, other site: Secondary | ICD-10-CM | POA: Diagnosis not present

## 2023-01-10 DIAGNOSIS — M7660 Achilles tendinitis, unspecified leg: Secondary | ICD-10-CM | POA: Diagnosis not present

## 2023-01-10 DIAGNOSIS — M7662 Achilles tendinitis, left leg: Secondary | ICD-10-CM

## 2023-01-10 MED ORDER — VITAMIN B-6 100 MG PO TABS
100.0000 mg | ORAL_TABLET | Freq: Every day | ORAL | 0 refills | Status: DC
  Filled 2023-01-10: qty 60, 60d supply, fill #0

## 2023-01-10 NOTE — Progress Notes (Signed)
States it is doing better

## 2023-01-10 NOTE — Progress Notes (Signed)
John Evans - 82 y.o. male MRN [id number]  Date of birth: 07/08/1941  Office Visit Note: Visit Date: 01/10/2023 PCP: Donita Merida Alcantar, MD Referred by: Donita Sitara Cashwell, MD  Subjective: Chief Complaint  Patient presents with   Left Leg - Follow-up   HPI: John Evans is a pleasant 82 y.o. male who presents today for left achilles calcific tendinopathy.  We have performed 2 trials of extracorporeal shockwave therapy treatment.  John Evans says he is getting good relief from these.  Feels like he is about 65-70% improved.  Still noticing some of the pain but certainly improved, able to walk without significant pain.  Does have some lower extremity neuropathy of the feet.  This is of both feet and does bother him at bedtime.  Took gabapentin in the past without much relief.  Really only bothers him at night.  Pertinent ROS were reviewed with the patient and found to be negative unless otherwise specified above in HPI.   Assessment & Plan: Visit Diagnoses:  1. 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 Insertional Achilles tendinopathy   3. Neuropathy involving both lower extremities    Plan: Alonza is making good progress from ESWT, we did repeat his third treatment today.  He does have upcoming physical therapy for Achilles stretching and strengthening, did recommend he continue this.  He will continue in his heel lifts bilaterally, we will work on weaning him out of this as he continues to improve.  Did provide options for his lower extremity neuropathy including gabapentin, Lyrica or low-dose amitriptyline.  He will do some research and see which 1 he would like to try.  May take vitamin B6 in the meantime. F/u in 1-week.  Follow-up: Return in about 1 week (around 01/17/2023) for for left achilles f/u.   Meds & Orders:  Meds ordered this encounter  Medications   pyridOXINE (VITAMIN B6) 100 MG tablet    Sig: Take 1 tablet (100 mg total) by mouth daily.     Dispense:  60 tablet    Refill:  0   No orders of the defined types were placed in this encounter.    Procedures: Procedure: ECSWT Indications: Achilles tendinopathy, calcific Achilles tendinitis   Procedure Details Consent: Risks of procedure as well as the alternatives and risks of each were explained to the patient.  Verbal consent for procedure obtained. Time Out: Verified patient identification, verified procedure, site was marked, verified correct patient position. The area was cleaned with alcohol swab.     The left distal Achilles was targeted for Extracorporeal shockwave therapy.    Preset: Achillodynia Power Level: 110 mJ Frequency: 12 Hz Impulse/cycles: 3000 Head size: Regular   Patient tolerated procedure well without immediate complications.      Clinical History: No specialty comments available.  He reports that he has never smoked. He has never used smokeless tobacco.  Recent Labs    05/13/22 1538 12/15/22 1455  LABURIC 7.6 3.7*    Objective:   Vital Signs: There were no vitals taken for this visit.  Physical Exam  Gen: Well-appearing, in no acute distress; non-toxic CV: Regular Rate. Well-perfused. Warm.  Resp: Breathing unlabored on room air; no wheezing. Psych: Fluid speech in conversation; appropriate affect; normal thought process Neuro: Sensation intact throughout. No gross coordination deficits.   Ortho Exam -Mild TTP at the distal insertion of the Achilles.  Very mild warmth to.  Full range of motion about the ankle.  Imaging: No  results found.  Past Medical/Family/Surgical/Social History: Medications & Allergies reviewed per EMR, new medications updated. 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   Elevated prostate specific antigen (PSA) 10/14/2013   Urinary urgency 10/14/2013   BPH (benign prostatic hyperplasia)    Epiretinal membrane, left 07/28/2011   Nuclear sclerotic cataract, right 07/28/2011   Presence of intraocular lens 07/28/2011   Viral conjunctivitis 07/28/2011   Epiretinal membrane 06/03/2011   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   Visual field defect 06/03/2011   Hypertension    HYPERLIPIDEMIA TYPE I / IV 04/14/2009   DYSPNEA 04/14/2009   TOE PAIN 09/03/2008   INSOMNIA UNSPECIFIED 06/13/2008   HYPERTROPHY PROSTATE W/O UR OBST & OTH LUTS 07/05/2007   Hyperlipemia 10/12/2006   HYPERTENSION, BENIGN SYSTEMIC 10/12/2006   Hyperplasia of prostate 10/12/2006   Past Medical History:  Diagnosis Date   BPH (benign prostatic hyperplasia)    Gout    Hyperlipidemia    Patient reportedly had a coronary calcium score of 21 last year I cannot find his report   Hypertension    Insomnia    Obstructive sleep apnea    Panic attacks    Family History  Problem Relation Age of Onset   Cancer Father        throat   Esophageal cancer Father    Diabetes Paternal Grandmother    Colon cancer Neg Hx    Past Surgical History:  Procedure Laterality Date   BACK SURGERY     EYE SURGERY     Social History   Occupational History   Occupation: 2  Tobacco Use   Smoking status: Never   Smokeless tobacco: Never  Substance and Sexual Activity   Alcohol use: No   Drug use: No   Sexual activity: Yes    Comment: married, retired Teacher, early years/pre

## 2023-01-11 DIAGNOSIS — M79672 Pain in left foot: Secondary | ICD-10-CM | POA: Diagnosis not present

## 2023-01-17 ENCOUNTER — Encounter: Payer: Self-pay | Admitting: Sports Medicine

## 2023-01-17 ENCOUNTER — Ambulatory Visit: Payer: Medicare Other | Admitting: Sports Medicine

## 2023-01-17 DIAGNOSIS — M7732 Calcaneal spur, left foot: Secondary | ICD-10-CM | POA: Diagnosis not present

## 2023-01-17 DIAGNOSIS — B351 Tinea unguium: Secondary | ICD-10-CM | POA: Diagnosis not present

## 2023-01-17 DIAGNOSIS — M6528 Calcific tendinitis, other site: Secondary | ICD-10-CM

## 2023-01-17 DIAGNOSIS — G5793 Unspecified mononeuropathy of bilateral lower limbs: Secondary | ICD-10-CM | POA: Diagnosis not present

## 2023-01-17 DIAGNOSIS — M7662 Achilles tendinitis, left leg: Secondary | ICD-10-CM

## 2023-01-17 DIAGNOSIS — M7731 Calcaneal spur, right foot: Secondary | ICD-10-CM | POA: Diagnosis not present

## 2023-01-17 DIAGNOSIS — M7660 Achilles tendinitis, unspecified leg: Secondary | ICD-10-CM

## 2023-01-17 NOTE — Progress Notes (Signed)
John Evans - 82 y.o. male MRN [id number]  Date of birth: 10/08/1940  Office Visit Note: Visit Date: 01/17/2023 PCP: Donita Shade Kaley, MD Referred by: Donita Nahla Lukin, MD  Subjective: Chief Complaint  Patient presents with   Left Leg - Follow-up   HPI: John Evans is a pleasant 82 y.o. male who presents today for left calcific tendinopathy.  We have performed 3 trials of extracorporeal shockwave therapy, he is noting good improvement from this.  He does continue with formalized physical therapy but is wondering if he can transition to a home rehab program.  He has just started the vitamin B6 supplementation, still having some burning and neuropathy-like pain of the foot, thinking about starting Elavil or Lyrica but would like to hold off at this time.  His neuropathy is more so at that time.  Pertinent ROS were reviewed with the patient and found to be negative unless otherwise specified above in HPI.   Assessment & Plan: Visit Diagnoses:  1. Insertional Achilles tendinopathy   2. 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 Neuropathy involving both lower extremities    Plan: John Evans continues to make good progress from extracorporeal shockwave therapy, we did repeat treatment today.  At this point I think he can transition from formal physical therapy into continuing a home exercise plan for the Achilles stretching and strengthening, he is agreeable to this.  Will continue with the heel lifts currently but we will work on weaning him out of this.  He will continue his vitamin B6 once daily.  He will follow-up next week for repeat evaluation and treatment.  May consider starting low-dose amitriptyline either 10 to 25 mg nightly but will hold for now.  Discussed that after ECSWT #5 (next visit), we will plan to repeat US evaluation of the achilles before additional shockwave to evaluate for tendon and calcification improvement.  Follow-up: Return in about 1 week  (around 01/24/2023) for for left achilles f/u (reg visit).   Meds & Orders: No orders of the defined types were placed in this encounter.  No orders of the defined types were placed in this encounter.    Procedures: Procedure: ECSWT Indications: Achilles tendinopathy, calcific Achilles tendinitis   Procedure Details Consent: Risks of procedure as well as the alternatives and risks of each were explained to the patient.  Verbal consent for procedure obtained. Time Out: Verified patient identification, verified procedure, site was marked, verified correct patient position. The area was cleaned with alcohol swab.     The left distal Achilles was targeted for Extracorporeal shockwave therapy.    Preset: Achillodynia Power Level: 110 mJ  Frequency: 12 Hz Impulse/cycles: 3500  Head size: Regular   Patient tolerated procedure well without immediate complications.       Clinical History: No specialty comments available.  He reports that he has never smoked. He has never used smokeless tobacco.  Recent Labs    05/13/22 1538 12/15/22 1455  LABURIC 7.6 3.7*    Objective:   Vital Signs: There were no vitals taken for this visit.  Physical Exam  Gen: Well-appearing, in no acute distress; non-toxic CV: Well-perfused. Warm.  Resp: Breathing unlabored on room air; no wheezing. Psych: Fluid speech in conversation; appropriate affect; normal thought process Neuro: Sensation intact throughout. No gross coordination deficits.   Ortho Exam - LLE: Very trivial TTP at the distal insertion of the Achilles, very mild bony bossing of the superior calcaneus.  Full range of  motion about the ankle.  Neurovascular intact distally.  Imaging: No results found.  Past Medical/Family/Surgical/Social History: Medications & Allergies reviewed per EMR, new medications updated. 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   Elevated prostate specific antigen (PSA) 10/14/2013   Urinary urgency 10/14/2013   BPH (benign prostatic hyperplasia)    Epiretinal membrane, left 07/28/2011   Nuclear sclerotic cataract, right 07/28/2011   Presence of intraocular lens 07/28/2011   Viral conjunctivitis 07/28/2011   Epiretinal membrane 06/03/2011   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   Visual field defect 06/03/2011   Hypertension    HYPERLIPIDEMIA TYPE I / IV 04/14/2009   DYSPNEA 04/14/2009   TOE PAIN 09/03/2008   INSOMNIA UNSPECIFIED 06/13/2008   HYPERTROPHY PROSTATE W/O UR OBST & OTH LUTS 07/05/2007   Hyperlipemia 10/12/2006   HYPERTENSION, BENIGN SYSTEMIC 10/12/2006   Hyperplasia of prostate 10/12/2006   Past Medical History:  Diagnosis Date   BPH (benign prostatic hyperplasia)    Gout    Hyperlipidemia    Patient reportedly had a coronary calcium score of 21 last year I cannot find his report   Hypertension    Insomnia    Obstructive sleep apnea    Panic attacks    Family History  Problem Relation Age of Onset   Cancer Father        throat   Esophageal cancer Father    Diabetes Paternal Grandmother    Colon cancer Neg Hx    Past Surgical History:  Procedure Laterality Date   BACK SURGERY     EYE SURGERY     Social History   Occupational History   Occupation: 2  Tobacco Use   Smoking status: Never   Smokeless tobacco: Never  Substance and Sexual Activity   Alcohol use: No   Drug use: No   Sexual activity: Yes     Comment: married, retired Teacher, early years/pre

## 2023-01-17 NOTE — Progress Notes (Signed)
Was doing better; States he still has a burning sensation that he feels like is traveling up his leg

## 2023-01-18 ENCOUNTER — Other Ambulatory Visit (HOSPITAL_COMMUNITY): Payer: Self-pay

## 2023-01-20 DIAGNOSIS — M79672 Pain in left foot: Secondary | ICD-10-CM | POA: Diagnosis not present

## 2023-01-25 ENCOUNTER — Ambulatory Visit: Payer: Medicare Other | Admitting: Sports Medicine

## 2023-01-25 ENCOUNTER — Other Ambulatory Visit (HOSPITAL_COMMUNITY): Payer: Self-pay

## 2023-01-25 ENCOUNTER — Encounter: Payer: Self-pay | Admitting: Sports Medicine

## 2023-01-25 DIAGNOSIS — M7660 Achilles tendinitis, unspecified leg: Secondary | ICD-10-CM | POA: Diagnosis not present

## 2023-01-25 DIAGNOSIS — G5793 Unspecified mononeuropathy of bilateral lower limbs: Secondary | ICD-10-CM

## 2023-01-25 DIAGNOSIS — M6528 Calcific tendinitis, other site: Secondary | ICD-10-CM

## 2023-01-25 MED ORDER — AMITRIPTYLINE HCL 10 MG PO TABS
10.0000 mg | ORAL_TABLET | Freq: Every day | ORAL | 1 refills | Status: DC
Start: 2023-01-25 — End: 2023-12-05
  Filled 2023-01-25 (×2): qty 60, 60d supply, fill #0
  Filled 2023-03-30 – 2023-03-31 (×2): qty 60, 60d supply, fill #1

## 2023-01-25 NOTE — Progress Notes (Signed)
John Evans - 82 y.o. male MRN [id number]  Date of birth: 1941/04/20  Office Visit Note: Visit Date: 01/25/2023 PCP: Donita Paysen Goza, MD Referred by: Donita Lowella Kindley, MD  Subjective: Chief Complaint  Patient presents with   Left Leg - Pain   HPI: John Evans is a pleasant 82 y.o. male who presents today for left distal achilles tendinopathy, LE neuropathy.   John Evans is doing well and has been noticing improvements with the distal Achilles calcification albeit slow.  He is getting some relief from the extracorporeal shockwave therapy.  Continues with rather bothersome burning around the heel and the feet of both feet.  He does think he is ready to do a trial of a medication.  Pertinent ROS were reviewed with the patient and found to be negative unless otherwise specified above in HPI.   Assessment & Plan: Visit Diagnoses:  1. Neuropathy involving both lower extremities   2. Insertional Achilles tendinopathy   3. 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  He will continue his home exercise plan for the Achilles, performing once daily.  He continues with burning and tingling, we will start him on a low-dose of amitriptyline 10 mg at nighttime.  He may start this dose for 2 weeks and if not achieving maximal benefit may increase to 20 mg nightly.  We will see him back at the end of next week to evaluate the Achilles and we will ultrasound to see what sort of improvements from hyperemia and calcification standpoint he has received from shockwave.   Additional treatment considerations: Nitroglycerin patch protocol, referral for surgical management if fails conservative  Follow-up: Return in about 1 week (around 02/01/2023) for for achilles - ultrasound (30-mins).   Meds & Orders:  Meds ordered this encounter  Medications    amitriptyline (ELAVIL) 10 MG tablet    Sig: Take 1 tablet (10 mg total) by mouth at bedtime.    Dispense:  60 tablet    Refill:  1   No orders of the defined types were placed in this encounter.    Procedures: Procedure: ECSWT Indications: Achilles tendinopathy, calcific Achilles tendinitis   Procedure Details Consent: Risks of procedure as well as the alternatives and risks of each were explained to the patient.  Verbal consent for procedure obtained. Time Out: Verified patient identification, verified procedure, site was marked, verified correct patient position. The area was cleaned with alcohol swab.     The left distal Achilles was targeted for Extracorporeal shockwave therapy.    Preset: Achillodynia Power Level: 100 - 120 mJ  Frequency: 12 Hz Impulse/cycles: 3500  Head size: Regular   Patient tolerated procedure well without immediate complications.        Clinical History: No specialty comments available.  He reports that he has never smoked. He has never used smokeless tobacco.  Recent Labs    05/13/22 1538 12/15/22 1455  LABURIC 7.6 3.7*    Objective:   Vital Signs: There were no vitals taken for this visit.  Physical Exam  Gen: Well-appearing, in no acute distress; non-toxic CV: Regular Rate. Well-perfused. Warm.  Resp: Breathing unlabored on room air; no wheezing. Psych: Fluid speech in conversation; appropriate affect; normal thought process Neuro: Sensation intact throughout. No gross coordination deficits.   Ortho Exam - LLE: There is some bony bossing over the posterior aspect of the  superior calcaneus.  No significant TTP upon palpation.  Good range of motion about the ankle in plantarflexion and dorsiflexion.  Neurovascular intact distally.  Imaging: No results found.  Past Medical/Family/Surgical/Social History: Medications & Allergies reviewed per EMR, new medications updated. 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   Elevated prostate specific antigen (PSA) 10/14/2013   Urinary urgency 10/14/2013   BPH (benign prostatic hyperplasia)    Epiretinal membrane, left 07/28/2011   Nuclear sclerotic cataract, right 07/28/2011   Presence of intraocular lens 07/28/2011   Viral conjunctivitis 07/28/2011   Epiretinal membrane 06/03/2011   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   Visual field defect 06/03/2011   Hypertension    HYPERLIPIDEMIA TYPE I / IV 04/14/2009   DYSPNEA 04/14/2009   TOE PAIN 09/03/2008   INSOMNIA UNSPECIFIED 06/13/2008   HYPERTROPHY PROSTATE W/O UR OBST & OTH LUTS 07/05/2007   Hyperlipemia 10/12/2006   HYPERTENSION, BENIGN SYSTEMIC 10/12/2006   Hyperplasia of prostate 10/12/2006   Past Medical History:  Diagnosis Date   BPH (benign prostatic hyperplasia)    Gout    Hyperlipidemia    Patient reportedly had a coronary calcium score of 21 last year I cannot find his report   Hypertension    Insomnia    Obstructive sleep apnea    Panic attacks    Family History  Problem Relation Age of Onset   Cancer Father        throat   Esophageal cancer Father    Diabetes Paternal Grandmother    Colon cancer Neg Hx    Past Surgical History:  Procedure Laterality Date   BACK SURGERY     EYE SURGERY     Social History   Occupational History   Occupation: 2  Tobacco Use   Smoking status: Never   Smokeless tobacco: Never  Substance and Sexual Activity    Alcohol use: No   Drug use: No   Sexual activity: Yes    Comment: married, retired Teacher, early years/pre

## 2023-01-26 ENCOUNTER — Other Ambulatory Visit (HOSPITAL_COMMUNITY): Payer: Self-pay

## 2023-01-26 DIAGNOSIS — M79672 Pain in left foot: Secondary | ICD-10-CM | POA: Diagnosis not present

## 2023-01-31 ENCOUNTER — Other Ambulatory Visit (HOSPITAL_COMMUNITY): Payer: Self-pay

## 2023-02-02 ENCOUNTER — Other Ambulatory Visit (HOSPITAL_COMMUNITY): Payer: Self-pay

## 2023-02-02 ENCOUNTER — Other Ambulatory Visit: Payer: Self-pay

## 2023-02-02 ENCOUNTER — Encounter: Payer: Self-pay | Admitting: Sports Medicine

## 2023-02-02 ENCOUNTER — Ambulatory Visit: Payer: Medicare Other | Admitting: Sports Medicine

## 2023-02-02 DIAGNOSIS — M7752 Other enthesopathy of left foot: Secondary | ICD-10-CM | POA: Diagnosis not present

## 2023-02-02 DIAGNOSIS — G5793 Unspecified mononeuropathy of bilateral lower limbs: Secondary | ICD-10-CM

## 2023-02-02 DIAGNOSIS — M6528 Calcific tendinitis, other site: Secondary | ICD-10-CM | POA: Diagnosis not present

## 2023-02-02 DIAGNOSIS — M7662 Achilles tendinitis, left leg: Secondary | ICD-10-CM | POA: Diagnosis not present

## 2023-02-02 MED ORDER — METHYLPREDNISOLONE 4 MG PO TBPK
ORAL_TABLET | ORAL | 0 refills | Status: DC
Start: 2023-02-02 — End: 2023-03-31
  Filled 2023-02-02: qty 21, 6d supply, fill #0

## 2023-02-02 NOTE — Progress Notes (Signed)
John Evans - 82 y.o. male MRN [id number]  Date of birth: [birth year]  Office Visit Note: Visit Date: 02/02/2023 PCP: Donita Marvin Maenza, MD Referred by: Donita Odarius Dines, MD  Subjective: Chief Complaint  Patient presents with   Left Achilles Tendon - Pain, Follow-up   HPI: John Evans is a pleasant 82 y.o. male who presents today for left distal Achilles calcific tendinopathy.  He has underwent 5 sessions of extracorporeal shockwave therapy, certainly making improvement from this.  Feels less pain in the back and some improvement in motion, but he is still feeling burning around the heel on the left foot but also some burning of both feet.  Last visit we started him on low-dose amitriptyline 10mg  at bedtime for his lower extremity neuropathy.  Has not noticed a great improvement, has not increase his dose yet.  Feels like he is still experiencing burning and neuropathy-like symptoms of both feet, although the left posterior heel is most bothersome.  Pertinent ROS were reviewed with the patient and found to be negative unless otherwise specified above in HPI.   Assessment & Plan: Visit Diagnoses:  1. 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 Tendonitis, Achilles, left   3. Neuropathy involving both lower extremities   4. Retrocalcaneal bursitis (back of heel), left    Plan: Discussed with Faustino that based on our ultrasound today in comparison from past, he has had some mild reduction in his calcific changes of the distal Achilles.  He has certainly found improvement from these treatments as well.  However, he is still having some burning sensation and the ultrasound does show some hyperemia of the distal Achilles and a degree of the retrocalcaneal bursa as well.  He has lower extremity neuropathy and I am wondering if these are his symptoms are more so from the hyperemia of the Achilles and retrocalcaneal bursa.  We will start him on a 6-day methylprednisolone  Dosepak.  He will see what sort of improvement as well as reduction in his burning/neuropathy symptoms he gets from this.  If he receives excellent relief, he may continue his home exercises as previously prescribed.  If he does not get significant relief from this, I want him to increase his amitriptyline from 10 mg to 20 mg nightly.  He will follow-up with me in about 3 weeks for reevaluation.  Additional treatment considerations: Retrocalcaneal bursa injection, short period of shutdown in a walking boot, referral to my foot and ankle surgeon Dr. Lajoyce Corners  Follow-up: Return in about 3 weeks (around 02/23/2023) for left achilles, burning feet.   Meds & Orders:  Meds ordered this encounter  Medications   methylPREDNISolone (MEDROL DOSEPAK) 4 MG TBPK tablet    Sig: Take per packet instructions. Taper dosing.    Dispense:  21 tablet    Refill:  0    Orders Placed This Encounter  Procedures   [place] Extrem Low Left Ltd     Procedures: No procedures performed      Clinical History: No specialty comments available.  He reports that he has never smoked. He has never used smokeless tobacco.  Recent Labs    05/13/22 1538 12/15/22 1455  LABURIC 7.6 3.7*    Objective:   Vital Signs: There were no vitals taken for this visit.  Physical Exam  Gen: Well-appearing, in no acute distress; non-toxic CV:  Well-perfused. Warm.  Resp: Breathing unlabored on room air; no wheezing. Psych: Fluid speech in conversation; appropriate affect; normal  thought process Neuro: Sensation intact throughout. No gross coordination deficits.   Ortho Exam - Left foot/ankle: There is mild bony bossing of the posterior aspect of the superior calcaneus, mild warmth to this region.  There is no swelling or effusion of the ankle.  There is tenderness to palpation of the superior calcaneus, negative calcaneal heel squeeze.  There is good range of motion in plantarflexion and dorsiflexion.  Neurovascularly intact distally.   Chronically ill-appearing skin turgor of the fourth toe on the right foot.  Imaging: [place] Extrem Low Left Ltd  Result Date: 02/02/2023 Limited musculoskeletal ultrasound of the left lower extremity, left ankle/foot and Achilles was performed today.  Short and long axis evaluation of the calcaneus with was visualized with bony spurring and distal Achilles calcific changes.  No notable inflammation on the cortex of the calcaneus.  The distal Achilles was visualized in short and long axis with calcific tendinopathy, upon review in comparison to previous ultrasound the most superior calcification is certainly smaller in size.  There is still some hyperemia noted of the distal Achilles around the calcification and just proximal to this.  Mild retrocalcaneal hyperemia indicative of possible bursitis.  There is no effusion of the ankle joint.  The Achilles was scanned approximately to the myotendinous junction without abnormality.  Achilles tendon with 0.96 cm in nature at the distal pole.   Past Medical/Family/Surgical/Social History: Medications & Allergies reviewed per EMR, new medications updated. 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   Elevated prostate specific antigen (PSA) 10/14/2013   Urinary urgency 10/14/2013   BPH (benign prostatic hyperplasia)    Epiretinal membrane, left 07/28/2011   Nuclear sclerotic cataract, right 07/28/2011    Presence of intraocular lens 07/28/2011   Viral conjunctivitis 07/28/2011   Epiretinal membrane 06/03/2011   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   Visual field defect 06/03/2011   Hypertension    HYPERLIPIDEMIA TYPE I / IV 04/14/2009   DYSPNEA 04/14/2009   TOE PAIN 09/03/2008   INSOMNIA UNSPECIFIED 06/13/2008   HYPERTROPHY PROSTATE W/O UR OBST & OTH LUTS 07/05/2007   Hyperlipemia 10/12/2006   HYPERTENSION, BENIGN SYSTEMIC 10/12/2006   Hyperplasia of prostate 10/12/2006   Past Medical History:  Diagnosis Date   BPH (benign prostatic hyperplasia)    Gout    Hyperlipidemia    Patient reportedly had a coronary calcium score of 21 last year I cannot find his report   Hypertension    Insomnia    Obstructive sleep apnea    Panic attacks    Family History  Problem Relation Age of Onset   Cancer Father        throat   Esophageal cancer Father    Diabetes Paternal Grandmother    Colon cancer Neg Hx    Past Surgical History:  Procedure Laterality Date   BACK SURGERY     EYE SURGERY     Social History   Occupational History   Occupation: 2  Tobacco Use  Smoking status: Never   Smokeless tobacco: Never  Substance and Sexual Activity   Alcohol use: No   Drug use: No   Sexual activity: Yes    Comment: married, retired Teacher, early years/pre

## 2023-02-13 DIAGNOSIS — M79672 Pain in left foot: Secondary | ICD-10-CM | POA: Diagnosis not present

## 2023-02-21 ENCOUNTER — Ambulatory Visit: Payer: Medicare Other | Admitting: Sports Medicine

## 2023-02-23 ENCOUNTER — Ambulatory Visit: Payer: Medicare Other | Admitting: Sports Medicine

## 2023-02-27 ENCOUNTER — Encounter: Payer: Self-pay | Admitting: Sports Medicine

## 2023-02-27 ENCOUNTER — Ambulatory Visit: Payer: Medicare Other | Admitting: Sports Medicine

## 2023-02-27 DIAGNOSIS — M7662 Achilles tendinitis, left leg: Secondary | ICD-10-CM | POA: Diagnosis not present

## 2023-02-27 DIAGNOSIS — M7752 Other enthesopathy of left foot: Secondary | ICD-10-CM | POA: Diagnosis not present

## 2023-02-27 DIAGNOSIS — G5793 Unspecified mononeuropathy of bilateral lower limbs: Secondary | ICD-10-CM

## 2023-02-27 DIAGNOSIS — M6528 Calcific tendinitis, other site: Secondary | ICD-10-CM | POA: Diagnosis not present

## 2023-02-27 DIAGNOSIS — M79672 Pain in left foot: Secondary | ICD-10-CM | POA: Diagnosis not present

## 2023-02-27 NOTE — Progress Notes (Signed)
Doing a lot better; not having any pain

## 2023-02-27 NOTE — Progress Notes (Signed)
John Evans - 82 y.o. male MRN [id number]  Date of birth: 11-14-1940  Office Visit Note: Visit Date: 02/27/2023 PCP: Donita Loreda Silverio, MD Referred by: Donita Karstyn Birkey, MD  Subjective: Chief Complaint  Patient presents with   Left Leg - Follow-up   HPI: John Evans is a pleasant 82 y.o. male who presents today for left distal Achilles calcific tendinopathy and LE neuropathy.   He has underwent 5 sessions of extracorporeal shockwave therapy with excellent improvement.  At last visit, we did proceed with a 6-day Medrol Dosepak to help with inflammation.  He states this was extremely helpful and essentially his pain is about 90-95% improved.  He also is continuing the amitriptyline 10 mg at bedtime which is helping with his restless leg/neuropathy symptoms and allowing him to sleep better.  He is pleased with his improvement at this standpoint.  He has 1 more appointment with Ellamae Sia, PT and then will be transferred to a home exercise program.  Pertinent ROS were reviewed with the patient and found to be negative unless otherwise specified above in HPI.   Assessment & Plan: Visit Diagnoses:  1. 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 Tendonitis, Achilles, left   3. Neuropathy involving both lower extremities   4. Retrocalcaneal bursitis (back of heel), left    Plan: Discussed with Millan that he and I were both pleased that he has had such good improvement with her above treatment modalities, feels he is at least 90-95% better in terms of his calcific Achilles tendinitis and retrocalcaneal bursitis.  He has completed his Medrol Dosepak, he will withhold from any further steroid use.  He will continue his formal physical therapy and transition to a home exercise program.  In terms of his lower extremity neuropathy, he will continue his amitriptyline 10 mg at bedtime.  We did discuss taking this for about 3 months before then tapering off.  Instructed on  good supportive shoe wear.  He will follow-up with me as needed.  Follow-up: Return if symptoms worsen or fail to improve.   Meds & Orders: No orders of the defined types were placed in this encounter.  No orders of the defined types were placed in this encounter.    Procedures: No procedures performed      Clinical History: No specialty comments available.  He reports that he has never smoked. He has never used smokeless tobacco.  Recent Labs    05/13/22 1538 12/15/22 1455  LABURIC 7.6 3.7*    Objective:    Physical Exam  Gen: Well-appearing, in no acute distress; non-toxic CV:  Well-perfused. Warm.  Resp: Breathing unlabored on room air; no wheezing. Psych: Fluid speech in conversation; appropriate affect; normal thought process Neuro: Sensation intact throughout. No gross coordination deficits.   Ortho Exam - Left foot/ankle: There is no swelling, redness or warmth of the ankle. Very trivial tenderness to palpation over the superior calcaneus, negative heel squeeze.  There is good range of motion with plantarflexion and dorsiflexion.  Neurovascular intact distally.  Nonantalgic gait.  Imaging: No results found.  Past Medical/Family/Surgical/Social History: Medications & Allergies reviewed per EMR, new medications updated. 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   Elevated prostate specific antigen (PSA)  10/14/2013   Urinary urgency 10/14/2013   BPH (benign prostatic hyperplasia)    Epiretinal membrane, left 07/28/2011   Nuclear sclerotic cataract, right 07/28/2011   Presence of intraocular lens 07/28/2011   Viral conjunctivitis 07/28/2011   Epiretinal membrane 06/03/2011   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   Visual field defect 06/03/2011   Hypertension    HYPERLIPIDEMIA TYPE I / IV 04/14/2009   DYSPNEA 04/14/2009   TOE PAIN 09/03/2008   INSOMNIA UNSPECIFIED 06/13/2008   HYPERTROPHY PROSTATE W/O UR OBST & OTH LUTS 07/05/2007   Hyperlipemia 10/12/2006   HYPERTENSION, BENIGN SYSTEMIC 10/12/2006   Hyperplasia of prostate 10/12/2006   Past Medical History:  Diagnosis Date   BPH (benign prostatic hyperplasia)    Gout    Hyperlipidemia    Patient reportedly had a coronary calcium score of 21 last year I cannot find his report   Hypertension    Insomnia    Obstructive sleep apnea    Panic attacks    Family History  Problem Relation Age of Onset   Cancer Father        throat   Esophageal cancer Father    Diabetes Paternal Grandmother    Colon cancer Neg Hx    Past Surgical History:  Procedure Laterality Date   BACK SURGERY     EYE SURGERY     Social History   Occupational History   Occupation: 2  Tobacco Use   Smoking status: Never   Smokeless tobacco: Never  Substance and Sexual Activity   Alcohol use: No   Drug use: No   Sexual activity: Yes    Comment: married, retired Teacher, early years/pre

## 2023-03-02 ENCOUNTER — Other Ambulatory Visit (HOSPITAL_COMMUNITY): Payer: Self-pay

## 2023-03-10 DIAGNOSIS — M79672 Pain in left foot: Secondary | ICD-10-CM | POA: Diagnosis not present

## 2023-03-31 ENCOUNTER — Encounter: Payer: Self-pay | Admitting: Sports Medicine

## 2023-03-31 ENCOUNTER — Other Ambulatory Visit (HOSPITAL_COMMUNITY): Payer: Self-pay

## 2023-03-31 ENCOUNTER — Ambulatory Visit: Payer: Medicare Other | Admitting: Sports Medicine

## 2023-03-31 DIAGNOSIS — G5793 Unspecified mononeuropathy of bilateral lower limbs: Secondary | ICD-10-CM

## 2023-03-31 DIAGNOSIS — M7752 Other enthesopathy of left foot: Secondary | ICD-10-CM

## 2023-03-31 DIAGNOSIS — M6528 Calcific tendinitis, other site: Secondary | ICD-10-CM | POA: Diagnosis not present

## 2023-03-31 MED ORDER — PREDNISONE 10 MG PO TABS
10.0000 mg | ORAL_TABLET | Freq: Every day | ORAL | 0 refills | Status: AC
  Filled 2023-03-31 (×3): qty 14, 14d supply, fill #0

## 2023-03-31 NOTE — Progress Notes (Signed)
John Evans - 82 y.o. male MRN [id number]  Date of birth: 16-Jun-1941  Office Visit Note: Visit Date: 03/31/2023 PCP: Donita Sarinity Dicicco, MD Referred by: Donita Khandi Kernes, MD  Subjective: Chief Complaint  Patient presents with   Left Foot - Follow-up   HPI: John Evans is a pleasant 82 y.o. male who presents today for left heel pain and burning, and LE neuropathy.   In the past, few months ago did complete 5 sessions of extracorporeal shockwave therapy with excellent improvement.  He has had both distal Achilles calcific tendinopathy as well as a degree of retrocalcaneal bursitis.  The bursitis itself responded well to the Medrol Dosepak months ago. Here over the past few weeks the heel has been burning - pain with shoewear and activity.  Also has bilateral lower extremity neuropathy, he does not feel like this is what is exacerbated.  He continues on Elavil 10 mg daily.  Pertinent ROS were reviewed with the patient and found to be negative unless otherwise specified above in HPI.   Assessment & Plan: Visit Diagnoses:  1. Retrocalcaneal bursitis (back of heel), left   2. 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 Neuropathy involving both lower extremities    Plan: Discussed with Marcus the nature of his acute on chronic left posterior heel pain.  He does have distal Achilles calcific tendinitis as well as what appears to be a degree of retrocalcaneal bursitis.  He responded excellent to shockwave therapy for the Achilles, but he does have some redness that I am concerned is more from retrocalcaneal bursitis.  Discussed to help guide management, we will obtain an MRI of the ankle to evaluate the exact pathology and where the swelling is emanating from.  To help with acute symptoms, we will place him on a low-dose 10 mg prednisone course for the next 10 days.  Clements and I both know he cannot continue this long-term.  He will continue his Elavil 10 mg for his  bilateral lower extremity neuropathy.  He will follow-up about 1 week after MRI scan.  MRI will guide management, but could consider ultrasound-guided retrocalcaneal bursa injection, Prolo or PRP therapy to Achilles if this is most evident on MRI scan.  Follow-up: Return for F/u 1-week after MRI (will need 30-min appt for poss US-inj).   Meds & Orders:  Meds ordered this encounter  Medications   predniSONE (DELTASONE) 10 MG tablet    Sig: Take 1 tablet (10 mg total) by mouth daily with breakfast for 14 doses. May take 1 tablet BID for first 2-3 days, then 10mg  daily (about 10 days PRN)    Dispense:  14 tablet    Refill:  0   No orders of the defined types were placed in this encounter.    Procedures: No procedures performed      Clinical History: No specialty comments available.  He reports that he has never smoked. He has never used smokeless tobacco.  Recent Labs    05/13/22 1538 12/15/22 1455  LABURIC 7.6 3.7*    Objective:    Physical Exam  Gen: Well-appearing, in no acute distress; non-toxic CV:  Well-perfused. Warm.  Resp: Breathing unlabored on room air; no wheezing. Psych: Fluid speech in conversation; appropriate affect; normal thought process Neuro: Sensation intact throughout. No gross coordination deficits.   Ortho Exam - Bilateral feet: Sensation intact.  Left heel there is some redness and mild warmth overlying the posterior calcaneus near the retrocalcaneal  bursa.  There is some mild TTP at the insertion of Achilles tendon.  Dorsiflexion to about 90 degrees.  Negative calcaneal heel squeeze.  Imaging:  *Previous imaging:  [place] Extrem Low Left Ltd Limited musculoskeletal ultrasound of the left lower extremity, left  ankle/foot and Achilles was performed today.  Short and long axis  evaluation of the calcaneus with was visualized with bony spurring and  distal Achilles calcific changes.  No notable inflammation on the cortex  of the calcaneus.  The distal  Achilles was visualized in short and long  axis with calcific tendinopathy, upon review in comparison to previous  ultrasound the most superior calcification is certainly smaller in size.   There is still some hyperemia noted of the distal Achilles around the  calcification and just proximal to this.  Mild retrocalcaneal hyperemia  indicative of possible bursitis.  There is no effusion of the ankle joint.   The Achilles was scanned approximately to the myotendinous junction  without abnormality.  Achilles tendon with 0.96 cm in nature at the distal  pole.   Past Medical/Family/Surgical/Social History: Medications & Allergies reviewed per EMR, new medications updated. 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   Elevated prostate specific antigen (PSA) 10/14/2013   Urinary urgency 10/14/2013   BPH (benign prostatic hyperplasia)    Epiretinal membrane, left 07/28/2011   Nuclear sclerotic cataract, right 07/28/2011   Presence of intraocular lens 07/28/2011   Viral conjunctivitis 07/28/2011   Epiretinal membrane 06/03/2011   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   Visual field defect 06/03/2011   Hypertension    HYPERLIPIDEMIA TYPE I / IV 04/14/2009   DYSPNEA 04/14/2009   TOE PAIN 09/03/2008   INSOMNIA UNSPECIFIED 06/13/2008   HYPERTROPHY PROSTATE W/O UR OBST & OTH LUTS 07/05/2007   Hyperlipemia 10/12/2006   HYPERTENSION, BENIGN SYSTEMIC  10/12/2006   Hyperplasia of prostate 10/12/2006   Past Medical History:  Diagnosis Date   BPH (benign prostatic hyperplasia)    Gout    Hyperlipidemia    Patient reportedly had a coronary calcium score of 21 last year I cannot find his report   Hypertension    Insomnia    Obstructive sleep apnea    Panic attacks    Family History  Problem Relation Age of Onset   Cancer Father        throat   Esophageal cancer Father    Diabetes Paternal Grandmother    Colon cancer Neg Hx    Past Surgical History:  Procedure Laterality Date   BACK SURGERY     EYE SURGERY     Social History   Occupational History   Occupation: 2  Tobacco Use   Smoking status: Never   Smokeless tobacco: Never  Substance and Sexual Activity   Alcohol use: No   Drug use: No   Sexual activity: Yes    Comment: married, retired Teacher, early years/pre

## 2023-04-03 ENCOUNTER — Ambulatory Visit
Admission: RE | Admit: 2023-04-03 | Discharge: 2023-04-03 | Disposition: A | Discharge status: Home / Self Care | Payer: Medicare Other | Source: Ambulatory Visit | Attending: Sports Medicine | Admitting: Sports Medicine

## 2023-04-03 DIAGNOSIS — M25572 Pain in left ankle and joints of left foot: Secondary | ICD-10-CM | POA: Diagnosis not present

## 2023-04-03 DIAGNOSIS — M7752 Other enthesopathy of left foot: Secondary | ICD-10-CM

## 2023-04-03 DIAGNOSIS — D179 Benign lipomatous neoplasm, unspecified: Secondary | ICD-10-CM | POA: Diagnosis not present

## 2023-04-03 DIAGNOSIS — M6528 Calcific tendinitis, other site: Secondary | ICD-10-CM

## 2023-04-04 ENCOUNTER — Other Ambulatory Visit (HOSPITAL_COMMUNITY): Payer: Self-pay

## 2023-04-11 ENCOUNTER — Ambulatory Visit: Payer: Medicare Other | Admitting: Sports Medicine

## 2023-04-11 ENCOUNTER — Encounter: Payer: Self-pay | Admitting: Sports Medicine

## 2023-04-11 DIAGNOSIS — M7752 Other enthesopathy of left foot: Secondary | ICD-10-CM | POA: Diagnosis not present

## 2023-04-11 DIAGNOSIS — M7662 Achilles tendinitis, left leg: Secondary | ICD-10-CM | POA: Diagnosis not present

## 2023-04-11 DIAGNOSIS — B351 Tinea unguium: Secondary | ICD-10-CM | POA: Diagnosis not present

## 2023-04-11 DIAGNOSIS — M79671 Pain in right foot: Secondary | ICD-10-CM

## 2023-04-11 DIAGNOSIS — G5793 Unspecified mononeuropathy of bilateral lower limbs: Secondary | ICD-10-CM | POA: Diagnosis not present

## 2023-04-11 DIAGNOSIS — M7731 Calcaneal spur, right foot: Secondary | ICD-10-CM | POA: Diagnosis not present

## 2023-04-11 DIAGNOSIS — L84 Corns and callosities: Secondary | ICD-10-CM | POA: Diagnosis not present

## 2023-04-11 DIAGNOSIS — M6528 Calcific tendinitis, other site: Secondary | ICD-10-CM | POA: Diagnosis not present

## 2023-04-11 DIAGNOSIS — M7732 Calcaneal spur, left foot: Secondary | ICD-10-CM | POA: Diagnosis not present

## 2023-04-11 NOTE — Progress Notes (Signed)
John Evans - 82 y.o. male MRN [id number]  Date of birth: [birth year]  Office Visit Note: Visit Date: 04/11/2023 PCP: Donita Cayenne Breault, MD Referred by: Donita Mikinzie Maciejewski, MD  Subjective: Chief Complaint  Patient presents with   Left Ankle - Pain   HPI: John Evans is a pleasant 82 y.o. male who presents today for acute on chronic left ankle/heel pain. Also with some burning of right heel/foot.  In the past, few months ago did complete 5 sessions of extracorporeal shockwave therapy with excellent improvement.  He has had both distal Achilles calcific tendinopathy as well as a degree of retrocalcaneal bursitis.  The bursitis initially responded well to the Medrol Dosepak months ago but then this has returned and has flared up quite significantly.  He does have signs and symptoms of bilateral lower extremity neuropathy, he started to have some burning in the right heel as well.  He continues on Elavil 10 mg daily.  He denies any numbness or tingling or radicular symptoms coming from the back.  Has no back pain.  His burning is localized to the heel and the plantar aspect of the foot L > R.  Pertinent ROS were reviewed with the patient and found to be negative unless otherwise specified above in HPI.   Assessment & Plan: Visit Diagnoses:  1. Retrocalcaneal bursitis (back of heel), left   2. 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 Neuropathy involving both lower extremities   4. Pain in right foot    Plan: Had a lengthy discussion with Lavin today regarding his left posterior heel pain as well as some pain in the right foot. We did review his MRI today which does reflect retrocalcaneal bursitis as well as known insertional Achilles tendinosis with calcification.  He has responded well in the past extracorporeal shockwave for the Achilles itself.  More of his pain is emanating from the retrocalcaneal bursitis as he does have swelling and pain located here.  We  discussed further diagnostic and hopefully therapeutic purposes, proceeding with ultrasound-guided retrocalcaneal bursa injection.  He is going to the [employer] so he would like to hold off on this for now.  He does have a cam walker boot at home, I would like him to go in this when he is performing prolonged standing or walking for the short-term to see if this can settle down the heel and Achilles.  He is describing some neuropathic pain in both heels as well, he is on Elavil 10 mg nightly.  He will continue this, could consider bumping up to 20 mg to see if this improves his symptoms.  I did evaluate his hip as well as his back today which moves fluidly without restriction.  He has no radicular symptoms other than the burning, low suspicion of this coming from the back.  We will see him next week for reevaluation and consideration of retrocalcaneal bursa injection if brief shutdown in CAM walker boot not helpful - he will let me know in the following week to what degree this relieves his pain.  If for some reason he is still having pain and burning in both feet, next steps would likely be obtaining EMG/nerve conduction studies with neurology to evaluate for lower extremity neuropathy or any other radiculopathy.  Low threshold for lumbar MRI but may consider if above are negative.  Follow-up: Return in about 9 days (around 04/20/2023) for about 1 week for left heel - consider US-guided heel (  RC) injection .   Meds & Orders: No orders of the defined types were placed in this encounter.  No orders of the defined types were placed in this encounter.    Procedures: No procedures performed      Clinical History: No specialty comments available.  He reports that he has never smoked. He has never used smokeless tobacco.  Recent Labs    05/13/22 1538 12/15/22 1455  LABURIC 7.6 3.7*    Objective:   Vital Signs: There were no vitals taken for this visit.  Physical Exam  Gen:  Well-appearing, in no acute distress; non-toxic CV: Well-perfused. Warm.  Resp: Breathing unlabored on room air; no wheezing. Psych: Fluid speech in conversation; appropriate affect; normal thought process Neuro: Sensation intact throughout. No gross coordination deficits.   Ortho Exam - Left foot/heel: + TTP over the distal Achilles and overlying the retrocalcaneal bursa.  There is a degree of swelling here without significant warmth.  There is an antalgic gait with gentle guarding landing onto the heel left greater than right.  Neurovascular intact distally.  - Lumbar: There is no midline spinous process TTP.  Full range of motion with flexion and extension.  Negative straight leg raise.  Negative modified slump test.  - Left hip: No bony TTP.  No leg length discrepancy.  The left hip moves smoothly with internal and external rotation without mechanical block.  Negative FABER, negative FADIR test.  Imaging: MR Ankle Left w/o contrast CLINICAL DATA:  Posterior left ankle pain for 4 months. No known injury.  EXAM: MRI OF THE LEFT ANKLE WITHOUT CONTRAST  TECHNIQUE: Multiplanar, multisequence MR imaging of the ankle was performed. No intravenous contrast was administered.  COMPARISON:  None Available.  FINDINGS: TENDONS  Peroneal: Intact.  Posteromedial: Intact. There is a small focus of intrasubstance increased T2 signal in the tibialis posterior tendon distally compatible with tendinosis.  Anterior: Intact.  Achilles: Intact. The tendon is thickened from near the musculotendinous junction to its insertion on the calcaneus with intrasubstance increased T2 signal. There is some spurring at the Achilles insertion. A small volume of fluid is present in the retrocalcaneal bursa.  Plantar Fascia: Intact. No evidence of plantar fasciitis. Plantar calcaneal spur noted.  LIGAMENTS  Lateral: Intact.  Medial: Intact.  CARTILAGE  Ankle Joint: Negative. No joint effusion or  osteochondral lesion of the talar dome.  Subtalar Joints/Sinus Tarsi: Normal.  Bones: No fracture, stress change or worrisome lesion. Benign lipoma in the calcaneus is noted.  Other: None.  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 Small volume of fluid in the retrocalcaneal bursa consistent with bursitis also noted.  Mild tendinosis of the tibialis posterior without tear.  Benign lipoma in the calcaneus.  Electronically Signed   By: Drusilla Kanner M.D.   On: 04/10/2023 08:14  Past Medical/Family/Surgical/Social History: Medications & Allergies reviewed per EMR, new medications updated. 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   Elevated prostate specific antigen (  PSA) 10/14/2013   Urinary urgency 10/14/2013   BPH (benign prostatic hyperplasia)    Epiretinal membrane, left 07/28/2011   Nuclear sclerotic cataract, right 07/28/2011   Presence of intraocular lens 07/28/2011   Viral conjunctivitis 07/28/2011   Epiretinal membrane 06/03/2011   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   Visual field defect 06/03/2011   Hypertension    HYPERLIPIDEMIA TYPE I / IV 04/14/2009   DYSPNEA 04/14/2009   TOE PAIN 09/03/2008   INSOMNIA UNSPECIFIED 06/13/2008   HYPERTROPHY PROSTATE W/O UR OBST & OTH LUTS 07/05/2007   Hyperlipemia 10/12/2006   HYPERTENSION, BENIGN SYSTEMIC 10/12/2006   Hyperplasia of prostate 10/12/2006    Past Medical History:  Diagnosis Date   BPH (benign prostatic hyperplasia)    Gout    Hyperlipidemia    Patient reportedly had a coronary calcium score of 21 last year I cannot find his report   Hypertension    Insomnia    Obstructive sleep apnea    Panic attacks    Family History  Problem Relation Age of Onset   Cancer Father        throat   Esophageal cancer Father    Diabetes Paternal Grandmother    Colon cancer Neg Hx    Past Surgical History:  Procedure Laterality Date   BACK SURGERY     EYE SURGERY     Social History   Occupational History   Occupation: 2  Tobacco Use   Smoking status: Never   Smokeless tobacco: Never  Substance and Sexual Activity   Alcohol use: No   Drug use: No   Sexual activity: Yes    Comment: married, retired Teacher, early years/pre

## 2023-04-20 ENCOUNTER — Ambulatory Visit: Payer: Medicare Other | Admitting: Sports Medicine

## 2023-04-20 ENCOUNTER — Encounter: Payer: Self-pay | Admitting: Sports Medicine

## 2023-04-20 ENCOUNTER — Other Ambulatory Visit: Payer: Self-pay

## 2023-04-20 DIAGNOSIS — G8929 Other chronic pain: Secondary | ICD-10-CM | POA: Diagnosis not present

## 2023-04-20 DIAGNOSIS — M79671 Pain in right foot: Secondary | ICD-10-CM | POA: Diagnosis not present

## 2023-04-20 DIAGNOSIS — M7752 Other enthesopathy of left foot: Secondary | ICD-10-CM | POA: Diagnosis not present

## 2023-04-20 MED ORDER — METHYLPREDNISOLONE ACETATE 40 MG/ML IJ SUSP
20.0000 mg | INTRAMUSCULAR | Status: AC | PRN
Start: 2023-04-20 — End: 2023-04-20
  Administered 2023-04-20: 20 mg via INTRA_ARTICULAR

## 2023-04-20 MED ORDER — LIDOCAINE HCL 1 % IJ SOLN
0.5000 mL | INTRAMUSCULAR | Status: AC | PRN
Start: 2023-04-20 — End: 2023-04-20
  Administered 2023-04-20: .5 mL

## 2023-04-20 MED ORDER — BUPIVACAINE HCL 0.25 % IJ SOLN
0.5000 mL | INTRAMUSCULAR | Status: AC | PRN
Start: 2023-04-20 — End: 2023-04-20
  Administered 2023-04-20: .5 mL via INTRA_ARTICULAR

## 2023-04-20 NOTE — Progress Notes (Signed)
Patient here for injection of left heel.

## 2023-04-20 NOTE — Progress Notes (Signed)
   Procedure Note  Patient: John Evans             Date of Birth: [birth year]           MRN: [id number]             Visit Date: 04/20/2023  Procedures: Visit Diagnoses:  1. Retrocalcaneal bursitis (back of heel), left   2. Heel pain, chronic, right    Medium Joint Inj: L ankle on 04/20/2023 10:56 AM Indications: pain, joint swelling and diagnostic evaluation Details: 25 G 1.5 in needle, ultrasound-guided posterior approach Medications: 0.5 mL lidocaine 1 %; 0.5 mL bupivacaine 0.25 %; 20 mg methylPREDNISolone acetate 40 MG/ML Outcome: tolerated well, no immediate complications  Procedure: Ultrasound-Guided Retrocalcaneal Bursa Injection, Left Ankle After discussion on R/B/I and informed verbal consent was obtained, a timeout was performed. Patient was lying prone on exam table. Utilizing ultrasound guidance, the retrocalcaneal bursa was identified deep to the Achilles tendon and superficial to the calcaneus. Using ultrasound guidance, a 25-gauge, 1.5 inch needle was inserted from a lateral to medial direction using an in-plane approach and an injectate of 0.5:0.5:0.5 of lidocaine:bupivicaine:methylprednisone was delivered into the bursa. Ultrasound visualization was seen with proper spread of injectate into the bursa. Patient tolerated procedure well without immediate complications. Band-aid was applied.   Procedure, treatment alternatives, risks and benefits explained, specific risks discussed. Consent was given by the patient. Immediately prior to procedure a time out was called to verify the correct patient, procedure, equipment, support staff and site/side marked as required. Patient was prepped and draped in the usual sterile fashion.     - I evaluated the patient about 5 minutes post-injection and he had improvement in pain - will remain in the CAM Walker boot x 1 additional week, then may use for one more week only as needed for prolonged walking; out completely over 2-week  period - follow-up as needed  - can message me in coming weeks to notify of improvement  Madelyn Brunner, DO Primary Care Sports Medicine Physician  [facility]  This note was dictated using Dragon naturally speaking software and may contain errors in syntax, spelling, or content which have not been identified prior to signing this note.

## 2023-04-27 ENCOUNTER — Other Ambulatory Visit: Payer: Self-pay | Admitting: Family Medicine

## 2023-04-27 ENCOUNTER — Other Ambulatory Visit (HOSPITAL_COMMUNITY): Payer: Self-pay

## 2023-04-28 ENCOUNTER — Other Ambulatory Visit (HOSPITAL_COMMUNITY): Payer: Self-pay

## 2023-04-28 NOTE — Telephone Encounter (Signed)
Unable to refill per protocol, Rx expired. Discontinued 05/13/22.  Requested Prescriptions  Pending Prescriptions Disp Refills   colchicine 0.6 MG tablet 30 tablet 0    Sig: Take 2 tablets by mouth when symptoms start. Then take one tablet by mouth one hour after. Then take one tablet by mouth twice a day until symptoms are gone     Endocrinology:  Gout Agents - colchicine Failed - 04/27/2023  4:04 PM      Failed - Valid encounter within last 12 months    Recent Outpatient Visits           1 year ago Benign essential HTN   [facility] Donita Brooks, MD   2 years ago Chronic left shoulder pain   [facility] Tanya Nones, Priscille Heidelberg, MD   2 years ago Orthostatic dizziness   [facility] Tanya Nones, Priscille Heidelberg, MD   2 years ago Pure hypercholesterolemia   [facility] Tanya Nones, Priscille Heidelberg, MD   3 years ago Acute idiopathic gout, unspecified site   [facility] Pickard, Priscille Heidelberg, MD              Passed - Cr in normal range and within 360 days    Creat  Date Value Ref Range Status  10/18/2022 0.94 0.70 - 1.22 mg/dL Final         Passed - ALT in normal range and within 360 days    ALT  Date Value Ref Range Status  10/18/2022 20 9 - 46 U/L Final         Passed - AST in normal range and within 360 days    AST  Date Value Ref Range Status  10/18/2022 22 10 - 35 U/L Final         Passed - CBC within normal limits and completed in the last 12 months    WBC  Date Value Ref Range Status  12/15/2022 9.8 3.8 - 10.8 Thousand/uL Final   RBC  Date Value Ref Range Status  12/15/2022 5.28 4.20 - 5.80 Million/uL Final   Hemoglobin  Date Value Ref Range Status  12/15/2022 16.5 13.2 - 17.1 g/dL Final   HCT  Date Value Ref Range Status  12/15/2022 47.3 38.5 - 50.0 % Final   MCHC  Date Value Ref Range Status  12/15/2022 34.9 32.0 - 36.0 g/dL Final   [facility]  Date Value Ref Range Status  12/15/2022 31.3 27.0  - 33.0 pg Final   MCV  Date Value Ref Range Status  12/15/2022 89.6 80.0 - 100.0 fL Final   No results found for: "PLTCOUNTKUC", "LABPLAT", "POCPLA" RDW  Date Value Ref Range Status  12/15/2022 12.8 11.0 - 15.0 % Final

## 2023-05-08 ENCOUNTER — Other Ambulatory Visit (HOSPITAL_COMMUNITY): Payer: Self-pay

## 2023-05-08 DIAGNOSIS — R311 Benign essential microscopic hematuria: Secondary | ICD-10-CM | POA: Diagnosis not present

## 2023-05-08 DIAGNOSIS — N200 Calculus of kidney: Secondary | ICD-10-CM | POA: Diagnosis not present

## 2023-05-08 MED ORDER — FINASTERIDE 5 MG PO TABS
5.0000 mg | ORAL_TABLET | Freq: Every day | ORAL | 3 refills | Status: DC
  Filled 2023-05-08 – 2023-06-25 (×2): qty 90, 90d supply, fill #0
  Filled 2023-09-26: qty 90, 90d supply, fill #1
  Filled 2023-12-25: qty 90, 90d supply, fill #2
  Filled 2024-03-27 (×2): qty 90, 90d supply, fill #0
  Filled 2024-03-27 (×2): qty 90, 90d supply, fill #3

## 2023-05-17 ENCOUNTER — Other Ambulatory Visit (HOSPITAL_COMMUNITY): Payer: Self-pay

## 2023-05-18 ENCOUNTER — Other Ambulatory Visit (HOSPITAL_BASED_OUTPATIENT_CLINIC_OR_DEPARTMENT_OTHER): Payer: Self-pay

## 2023-05-18 MED ORDER — ZOSTER VAC RECOMB ADJUVANTED 50 MCG/0.5ML IM SUSR
0.5000 mL | Freq: Once | INTRAMUSCULAR | 0 refills | Status: AC
  Filled 2023-05-18: qty 0.5, 1d supply, fill #0

## 2023-06-08 ENCOUNTER — Other Ambulatory Visit (HOSPITAL_COMMUNITY): Payer: Self-pay

## 2023-06-12 ENCOUNTER — Other Ambulatory Visit (HOSPITAL_BASED_OUTPATIENT_CLINIC_OR_DEPARTMENT_OTHER): Payer: Self-pay

## 2023-06-12 MED ORDER — FLUAD 0.5 ML IM SUSY
0.5000 mL | PREFILLED_SYRINGE | Freq: Once | INTRAMUSCULAR | 0 refills | Status: AC
  Filled 2023-06-12: qty 0.5, 1d supply, fill #0

## 2023-06-20 DIAGNOSIS — M7731 Calcaneal spur, right foot: Secondary | ICD-10-CM | POA: Diagnosis not present

## 2023-06-20 DIAGNOSIS — M7732 Calcaneal spur, left foot: Secondary | ICD-10-CM | POA: Diagnosis not present

## 2023-06-20 DIAGNOSIS — B351 Tinea unguium: Secondary | ICD-10-CM | POA: Diagnosis not present

## 2023-06-20 DIAGNOSIS — M7662 Achilles tendinitis, left leg: Secondary | ICD-10-CM | POA: Diagnosis not present

## 2023-06-20 DIAGNOSIS — M65872 Other synovitis and tenosynovitis, left ankle and foot: Secondary | ICD-10-CM | POA: Diagnosis not present

## 2023-06-20 DIAGNOSIS — M109 Gout, unspecified: Secondary | ICD-10-CM | POA: Diagnosis not present

## 2023-06-26 ENCOUNTER — Other Ambulatory Visit (HOSPITAL_COMMUNITY): Payer: Self-pay

## 2023-07-28 DIAGNOSIS — M7731 Calcaneal spur, right foot: Secondary | ICD-10-CM | POA: Diagnosis not present

## 2023-07-28 DIAGNOSIS — B351 Tinea unguium: Secondary | ICD-10-CM | POA: Diagnosis not present

## 2023-07-28 DIAGNOSIS — M109 Gout, unspecified: Secondary | ICD-10-CM | POA: Diagnosis not present

## 2023-07-28 DIAGNOSIS — M7732 Calcaneal spur, left foot: Secondary | ICD-10-CM | POA: Diagnosis not present

## 2023-07-28 DIAGNOSIS — M65872 Other synovitis and tenosynovitis, left ankle and foot: Secondary | ICD-10-CM | POA: Diagnosis not present

## 2023-07-28 DIAGNOSIS — M7662 Achilles tendinitis, left leg: Secondary | ICD-10-CM | POA: Diagnosis not present

## 2023-08-01 ENCOUNTER — Other Ambulatory Visit: Payer: Self-pay | Admitting: Family Medicine

## 2023-08-02 ENCOUNTER — Other Ambulatory Visit (HOSPITAL_COMMUNITY): Payer: Self-pay

## 2023-08-02 MED ORDER — FEBUXOSTAT 40 MG PO TABS
40.0000 mg | ORAL_TABLET | Freq: Every day | ORAL | 3 refills | Status: DC
  Filled 2023-08-02: qty 90, 90d supply, fill #0
  Filled 2023-11-07: qty 90, 90d supply, fill #1

## 2023-08-03 ENCOUNTER — Other Ambulatory Visit (HOSPITAL_BASED_OUTPATIENT_CLINIC_OR_DEPARTMENT_OTHER): Payer: Self-pay

## 2023-08-03 MED ORDER — RSVPREF3 VAC RECOMB ADJUVANTED 120 MCG/0.5ML IM SUSR
0.5000 mL | Freq: Once | INTRAMUSCULAR | 0 refills | Status: AC
  Filled 2023-08-03: qty 0.5, 1d supply, fill #0

## 2023-08-15 ENCOUNTER — Ambulatory Visit (INDEPENDENT_AMBULATORY_CARE_PROVIDER_SITE_OTHER): Payer: Medicare Other | Admitting: Sports Medicine

## 2023-08-15 ENCOUNTER — Other Ambulatory Visit (HOSPITAL_COMMUNITY): Payer: Self-pay

## 2023-08-15 ENCOUNTER — Encounter: Payer: Self-pay | Admitting: Sports Medicine

## 2023-08-15 DIAGNOSIS — M79671 Pain in right foot: Secondary | ICD-10-CM

## 2023-08-15 DIAGNOSIS — M6528 Calcific tendinitis, other site: Secondary | ICD-10-CM | POA: Diagnosis not present

## 2023-08-15 DIAGNOSIS — R29898 Other symptoms and signs involving the musculoskeletal system: Secondary | ICD-10-CM

## 2023-08-15 DIAGNOSIS — M7661 Achilles tendinitis, right leg: Secondary | ICD-10-CM

## 2023-08-15 DIAGNOSIS — R2689 Other abnormalities of gait and mobility: Secondary | ICD-10-CM

## 2023-08-15 DIAGNOSIS — M79672 Pain in left foot: Secondary | ICD-10-CM

## 2023-08-15 DIAGNOSIS — M7752 Other enthesopathy of left foot: Secondary | ICD-10-CM

## 2023-08-15 DIAGNOSIS — M7662 Achilles tendinitis, left leg: Secondary | ICD-10-CM

## 2023-08-15 MED ORDER — METHYLPREDNISOLONE 4 MG PO TBPK
ORAL_TABLET | ORAL | 0 refills | Status: DC
  Filled 2023-08-15: qty 21, 6d supply, fill #0

## 2023-08-15 NOTE — Progress Notes (Signed)
 Patient says that he has had heel pain that has gotten worse in the last month. He says he had some relief from the injection last visit, but he is now limping while he walks and has burning in the heels at rest. He says that he is now having hip pain in both hips due to the change in his gait. He says he takes Aleve twice a day. He did not have relief with Gabapentin  previously. Patient says that his pain is in the back of the heel as well as along the sides and is tender to touch. 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  Patient was instructed in 10 minutes of therapeutic exercises for bilateral hips/hip abductors to improve strength, ROM and function according to my instructions and plan of care by a Certified Athletic Trainer during the office visit. A customized handout was provided and demonstration of proper technique shown and discussed. Patient did perform exercises and demonstrate understanding through teachback.  All questions discussed and answered.

## 2023-08-15 NOTE — Progress Notes (Signed)
 John Evans - 82 y.o. male MRN [id number]  Date of birth: 05/30/1941  Office Visit Note: Visit Date: 08/15/2023 PCP: Duanne Butler DASEN, MD Referred by: Duanne Butler DASEN, MD  Subjective: Chief Complaint  Patient presents with   Left Heel - Pain   Right Heel - Pain   HPI: John Evans is a pleasant 82 y.o. male who presents today for follow-up of bilateral heel pain. His daughter, John Evans, is present for today's visit who does have provide some of HPI.  Woodie returns for follow-up of bilateral heel pain that has significantly worsened over the past few weeks to months.  Per RT and his daughter, this is affecting the way he walks, which is more off balance but as well as painful.  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  Previous treatments include: -PT/home therapy -5 treatments of extracorporeal shockwave therapy --> this initially gave him a lot of pain relief (90-95%) but then his pain returned and more recent treatments were not as helpful -Has received temporary relief from oral prednisone  -We did perform a retrocalcaneal bursa injection on the left on 04/20/2023 which gave him between 25-50% relief, also had a brief shutdown in the cam walker boot  None of these treatments completely resolved his pain.  He is using Aleve twice daily for mild pain control.  Pertinent ROS were reviewed with the patient and found to be negative unless otherwise specified above in HPI.   Assessment & Plan: Visit Diagnoses:  1. 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 Calcific Achilles tendinitis of right lower extremity   3. Retrocalcaneal bursitis (back of heel), left   4. Heel pain, bilateral   5. Weakness of right hip   6. Functional gait abnormality    Plan: Impression is acute exacerbation of chronic bilateral heel pain with prior significant calcific Achilles tendinopathy near the insertion.  He  did have a degree of retrocalcaneal bursitis as well although I think this is more secondary to the origin of his pain.  He has received temporary relief from extracorporeal shockwave, oral prednisone /NSAIDs, cam walker, retrocalcaneal bursa injection but his pain has returned and is quite bothersome affecting the way he walks.  He is not interested in surgical treatments at this time.  We did discuss other alternative options such as Tenex or PRP.  He is interested in Tenex therapy, I discussed I will see locally in the area (possibly even [facility]) who may be able to perform this therapy and once I hear back I will let him know/send referral.  In the short-term, for his inflammation and bursitis, we will start him on a 6-day Medrol  Dosepak to help symptomatically.  He also has weak hip abductors which I think is contributing to his functional gait abnormality, we did print out a customized hip abductor strengthening and hip stabilization protocol.  My athletic trainer, Jinnie did review these exercises in the room with him today.  He will perform these once daily.   Follow-up: Return for Will notify patient about Tenex referral.   Meds & Orders:  Meds ordered this encounter  Medications   methylPREDNISolone  (MEDROL  DOSEPAK) 4 MG TBPK tablet    Sig: Take per package instructions. (Taper dosing)    Dispense:  21 each    Refill:  0   No orders of the defined types were placed in this encounter.   Procedures: No  procedures performed      Clinical History: No specialty comments available.  He reports that he has never smoked. He has never used smokeless tobacco.  Recent Labs    12/15/22 1455  LABURIC 3.7*    Objective:    Physical Exam  Gen: Well-appearing, in no acute distress; non-toxic CV: Well-perfused. Warm.  Resp: Breathing unlabored on room air; no wheezing. Psych: Fluid speech in conversation; appropriate affect; normal thought process  Ortho Exam - Bilateral heels:  - Gait/Hips:  Patient walks with an antalgic gait, there is a mild Trendelenburg to the left side.  He has weakness with resisted hip abduction that is 3/5 with hip abductors with concomitant pain.  The right side is slightly weak but certainly improved from his contralateral side.  Imaging:  - R foot XR 12/15/22: Three-view radiographs of his foot demonstrate well-maintained alignment  no acute fractures.  No findings consistent with osteomyelitis.  He does  have quite a bit of erosive change at the distal phalanx of the fourth  toe.  This is also present on previous x-rays 2 years ago and 14 years  ago.  Has progressed slightly.  He also has calcifications on the inferior  and posterior surface of the calcaneus.   MR Ankle Left w/o contrast CLINICAL DATA:  Posterior left ankle pain for 4 months. No known injury.  EXAM: MRI OF THE LEFT ANKLE WITHOUT CONTRAST  TECHNIQUE: Multiplanar, multisequence MR imaging of the ankle was performed. No intravenous contrast was administered.  COMPARISON:  None Available.  FINDINGS: TENDONS  Peroneal: Intact.  Posteromedial: Intact. There is a small focus of intrasubstance increased T2 signal in the tibialis posterior tendon distally compatible with tendinosis.  Anterior: Intact.  Achilles: Intact. The tendon is thickened from near the musculotendinous junction to its insertion on the calcaneus with intrasubstance increased T2 signal. There is some spurring at the Achilles insertion. A small volume of fluid is present in the retrocalcaneal bursa.  Plantar Fascia: Intact. No evidence of plantar fasciitis. Plantar calcaneal spur noted.  LIGAMENTS  Lateral: Intact.  Medial: Intact.  CARTILAGE  Ankle Joint: Negative. No joint effusion or osteochondral lesion of the talar dome.  Subtalar Joints/Sinus Tarsi: Normal.  Bones: No fracture, stress change or worrisome lesion. Benign lipoma in the calcaneus is noted.  Other:  None.  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 Small volume of fluid in the retrocalcaneal bursa consistent with bursitis also noted.  Mild tendinosis of the tibialis posterior without tear.  Benign lipoma in the calcaneus.  Electronically Signed   By: Debby Prader M.D.   On: 04/10/2023 08:14   Past Medical/Family/Surgical/Social History: Medications & Allergies reviewed per EMR, new medications updated. 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   Elevated prostate specific antigen (PSA) 10/14/2013   Urinary urgency 10/14/2013   BPH (benign prostatic hyperplasia)    Epiretinal membrane, left 07/28/2011   Nuclear sclerotic cataract, right 07/28/2011   Presence of  intraocular lens 07/28/2011   Viral conjunctivitis 07/28/2011   Epiretinal membrane 06/03/2011   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   Visual field defect 06/03/2011   Hypertension    HYPERLIPIDEMIA TYPE I / IV 04/14/2009   DYSPNEA 04/14/2009   TOE PAIN 09/03/2008   INSOMNIA UNSPECIFIED 06/13/2008   HYPERTROPHY PROSTATE W/O UR OBST & OTH LUTS 07/05/2007   Hyperlipemia 10/12/2006   HYPERTENSION, BENIGN SYSTEMIC 10/12/2006   Hyperplasia of prostate 10/12/2006   Past Medical History:  Diagnosis Date   BPH (benign prostatic hyperplasia)    Gout    Hyperlipidemia    Patient reportedly had a coronary calcium  score of 21 last year I cannot find his  report   Hypertension    Insomnia    Obstructive sleep apnea    Panic attacks    Family History  Problem Relation Age of Onset   Cancer Father        throat   Esophageal cancer Father    Diabetes Paternal Grandmother    Colon cancer Neg Hx    Past Surgical History:  Procedure Laterality Date   BACK SURGERY     EYE SURGERY     Social History   Occupational History   Occupation: 2  Tobacco Use   Smoking status: Never   Smokeless tobacco: Never  Substance and Sexual Activity   Alcohol use: No   Drug use: No   Sexual activity: Yes    Comment: married, retired teacher, early years/pre

## 2023-08-22 ENCOUNTER — Encounter: Payer: Self-pay | Admitting: Sports Medicine

## 2023-08-24 ENCOUNTER — Other Ambulatory Visit: Payer: Self-pay | Admitting: Sports Medicine

## 2023-08-24 DIAGNOSIS — M6528 Calcific tendinitis, other site: Secondary | ICD-10-CM

## 2023-08-24 DIAGNOSIS — M79671 Pain in right foot: Secondary | ICD-10-CM

## 2023-09-05 DIAGNOSIS — M7731 Calcaneal spur, right foot: Secondary | ICD-10-CM | POA: Diagnosis not present

## 2023-09-05 DIAGNOSIS — M7662 Achilles tendinitis, left leg: Secondary | ICD-10-CM | POA: Diagnosis not present

## 2023-09-05 DIAGNOSIS — M65872 Other synovitis and tenosynovitis, left ankle and foot: Secondary | ICD-10-CM | POA: Diagnosis not present

## 2023-09-05 DIAGNOSIS — M7732 Calcaneal spur, left foot: Secondary | ICD-10-CM | POA: Diagnosis not present

## 2023-09-21 NOTE — Telephone Encounter (Signed)
 The referral has been sent to [facility]. I see in care everywhere they have it and is in review.

## 2023-09-23 ENCOUNTER — Emergency Department (HOSPITAL_COMMUNITY): Payer: Medicare Other

## 2023-09-23 ENCOUNTER — Emergency Department (HOSPITAL_COMMUNITY)
Admission: EM | Admit: 2023-09-23 | Discharge: 2023-09-23 | Disposition: A | Discharge status: Home / Self Care | Payer: Medicare Other

## 2023-09-23 ENCOUNTER — Other Ambulatory Visit: Payer: Self-pay

## 2023-09-23 ENCOUNTER — Encounter (HOSPITAL_COMMUNITY): Payer: Self-pay

## 2023-09-23 DIAGNOSIS — R0789 Other chest pain: Secondary | ICD-10-CM | POA: Diagnosis not present

## 2023-09-23 DIAGNOSIS — R42 Dizziness and giddiness: Secondary | ICD-10-CM | POA: Diagnosis not present

## 2023-09-23 DIAGNOSIS — R7309 Other abnormal glucose: Secondary | ICD-10-CM | POA: Insufficient documentation

## 2023-09-23 DIAGNOSIS — I2510 Atherosclerotic heart disease of native coronary artery without angina pectoris: Secondary | ICD-10-CM | POA: Diagnosis not present

## 2023-09-23 DIAGNOSIS — R6889 Other general symptoms and signs: Secondary | ICD-10-CM | POA: Diagnosis not present

## 2023-09-23 DIAGNOSIS — R1084 Generalized abdominal pain: Secondary | ICD-10-CM | POA: Diagnosis not present

## 2023-09-23 DIAGNOSIS — N200 Calculus of kidney: Secondary | ICD-10-CM | POA: Diagnosis not present

## 2023-09-23 DIAGNOSIS — R55 Syncope and collapse: Secondary | ICD-10-CM | POA: Insufficient documentation

## 2023-09-23 DIAGNOSIS — Z743 Need for continuous supervision: Secondary | ICD-10-CM | POA: Diagnosis not present

## 2023-09-23 DIAGNOSIS — H579 Unspecified disorder of eye and adnexa: Secondary | ICD-10-CM | POA: Diagnosis not present

## 2023-09-23 DIAGNOSIS — K5730 Diverticulosis of large intestine without perforation or abscess without bleeding: Secondary | ICD-10-CM | POA: Diagnosis not present

## 2023-09-23 DIAGNOSIS — R079 Chest pain, unspecified: Secondary | ICD-10-CM | POA: Diagnosis not present

## 2023-09-23 DIAGNOSIS — R1110 Vomiting, unspecified: Secondary | ICD-10-CM | POA: Diagnosis not present

## 2023-09-23 DIAGNOSIS — Q600 Renal agenesis, unilateral: Secondary | ICD-10-CM | POA: Diagnosis not present

## 2023-09-23 DIAGNOSIS — R404 Transient alteration of awareness: Secondary | ICD-10-CM | POA: Diagnosis not present

## 2023-09-23 LAB — CBC
HCT: 46.9 % (ref 39.0–52.0)
Hemoglobin: 16.7 g/dL (ref 13.0–17.0)
MCH: 31.7 pg (ref 26.0–34.0)
MCHC: 35.6 g/dL (ref 30.0–36.0)
MCV: 89 fL (ref 80.0–100.0)
Platelets: 220 10*3/uL (ref 150–400)
RBC: 5.27 MIL/uL (ref 4.22–5.81)
RDW: 12.5 % (ref 11.5–15.5)
WBC: 10.6 10*3/uL — ABNORMAL HIGH (ref 4.0–10.5)
nRBC: 0 % (ref 0.0–0.2)

## 2023-09-23 LAB — URINALYSIS, ROUTINE W REFLEX MICROSCOPIC
Bacteria, UA: NONE SEEN
Bilirubin Urine: NEGATIVE
Glucose, UA: NEGATIVE mg/dL
Ketones, ur: 20 mg/dL — AB
Leukocytes,Ua: NEGATIVE
Nitrite: NEGATIVE
Protein, ur: 100 mg/dL — AB
Specific Gravity, Urine: 1.013 (ref 1.005–1.030)
pH: 7 (ref 5.0–8.0)

## 2023-09-23 LAB — I-STAT CHEM 8, ED
BUN: 17 mg/dL (ref 8–23)
Calcium, Ion: 1.1 mmol/L — ABNORMAL LOW (ref 1.15–1.40)
Chloride: 104 mmol/L (ref 98–111)
Creatinine, Ser: 1.1 mg/dL (ref 0.61–1.24)
Glucose, Bld: 123 mg/dL — ABNORMAL HIGH (ref 70–99)
HCT: 46 % (ref 39.0–52.0)
Hemoglobin: 15.6 g/dL (ref 13.0–17.0)
Potassium: 3.7 mmol/L (ref 3.5–5.1)
Sodium: 141 mmol/L (ref 135–145)
TCO2: 25 mmol/L (ref 22–32)

## 2023-09-23 LAB — COMPREHENSIVE METABOLIC PANEL
ALT: 23 U/L (ref 0–44)
AST: 26 U/L (ref 15–41)
Albumin: 3.9 g/dL (ref 3.5–5.0)
Alkaline Phosphatase: 71 U/L (ref 38–126)
Anion gap: 13 (ref 5–15)
BUN: 14 mg/dL (ref 8–23)
CO2: 22 mmol/L (ref 22–32)
Calcium: 9.1 mg/dL (ref 8.9–10.3)
Chloride: 105 mmol/L (ref 98–111)
Creatinine, Ser: 1.21 mg/dL (ref 0.61–1.24)
GFR, Estimated: 59 mL/min — ABNORMAL LOW (ref >60)
Glucose, Bld: 140 mg/dL — ABNORMAL HIGH (ref 70–99)
Potassium: 3.7 mmol/L (ref 3.5–5.1)
Sodium: 140 mmol/L (ref 135–145)
Total Bilirubin: 1.1 mg/dL (ref 0.0–1.2)
Total Protein: 6.5 g/dL (ref 6.5–8.1)

## 2023-09-23 LAB — I-STAT CG4 LACTIC ACID, ED
Lactic Acid, Venous: 2.9 mmol/L (ref 0.5–1.9)
Lactic Acid, Venous: 1.7 mmol/L (ref 0.5–1.9)

## 2023-09-23 LAB — TROPONIN I (HIGH SENSITIVITY)
Troponin I (High Sensitivity): 4 ng/L (ref <18)
Troponin I (High Sensitivity): 3 ng/L (ref <18)

## 2023-09-23 LAB — LIPASE, BLOOD: Lipase: 25 U/L (ref 11–51)

## 2023-09-23 LAB — CBG MONITORING, ED: Glucose-Capillary: 98 mg/dL (ref 70–99)

## 2023-09-23 MED ORDER — IOHEXOL 350 MG/ML SOLN
100.0000 mL | Freq: Once | INTRAVENOUS | Status: AC | PRN
  Administered 2023-09-23: 100 mL via INTRAVENOUS

## 2023-09-23 MED ORDER — FENTANYL CITRATE PF 50 MCG/ML IJ SOSY
50.0000 ug | PREFILLED_SYRINGE | Freq: Once | INTRAMUSCULAR | Status: AC
  Administered 2023-09-23: 50 ug via INTRAVENOUS
  Filled 2023-09-23: qty 1

## 2023-09-23 NOTE — Discharge Instructions (Signed)
 Please follow-up with your primary doctor.  Please return immediately for any fevers, chills, sudden onset headache, vision changes, facial droop, unilateral weakness, chest pain, shortness of breath, passout or you develop any new or worsening symptoms that are concerning to you.

## 2023-09-23 NOTE — ED Notes (Signed)
 Patient ambulate very well Tech walked patient pass the nurse's station and back to the room . Patient stated that he was no longer dizzy.

## 2023-09-23 NOTE — ED Triage Notes (Signed)
 Pt to ED via [facility] from home c/o dizziness, chest pain, abdominal pain  x 1 hour. So since chest pain has resolved, but abdominal pain getting worse. RLQ pain radiates to back, vomiting in triage. #20 LAC- No medications given by EMS.   Last VS: 180/75, hr 60, 99%RA, CBG 125.

## 2023-09-23 NOTE — ED Provider Notes (Signed)
  EMERGENCY DEPARTMENT AT [facility] Provider Note   CSN: [id number] Arrival date & time: 09/23/23  1339     History  Chief Complaint  Patient presents with   Flank Pain    right   Abdominal Pain   Emesis   Dizziness   Chest Pain    Chaz ATWOOD ADCOCK is a 83 y.o. male.  83 year old male presenting emergency department for chest pain, lower abdominal pain.  See ED course for full HPI   Flank Pain Associated symptoms include chest pain and abdominal pain.  Abdominal Pain Associated symptoms: chest pain and vomiting   Emesis Associated symptoms: abdominal pain   Dizziness Associated symptoms: chest pain and vomiting   Chest Pain Associated symptoms: abdominal pain, dizziness and vomiting        Home Medications Prior to Admission medications   Medication Sig Start Date End Date Taking? Authorizing Provider  ALPRAZolam  (XANAX ) 0.5 MG tablet Take 1 tablet by mouth at bedtime as needed. 08/18/22   Duanne Butler DASEN, MD  amitriptyline  (ELAVIL ) 10 MG tablet Take 1 tablet (10 mg total) by mouth at bedtime. 01/25/23   Brooks, Dana, DO  cephALEXin  (KEFLEX ) 500 MG capsule Take 1 capsule (500 mg total) by mouth 4 (four) times daily. 12/16/22   Persons, Ronal Dragon, PA  diclofenac  Sodium (VOLTAREN  ARTHRITIS PAIN) 1 % GEL APPLY 2 GRAMS TO THE AFFECTED AREA(S) 4 TIMES PER DAY 11/25/22     febuxostat  (ULORIC ) 40 MG tablet Take 1 tablet (40 mg total) by mouth daily. 08/02/23   Duanne Butler DASEN, MD  finasteride  (PROSCAR ) 5 MG tablet Take 1 tablet (5 mg total) by mouth daily. 05/08/23     fluorouracil  (EFUDEX ) 5 % cream Apply a small amount to skin every evening for 14 days 12/06/22     icosapent  Ethyl (VASCEPA ) 1 g capsule Take 2 capsules (2 g total) by mouth 2 (two) times daily. 10/21/22   Duanne Butler DASEN, MD  meloxicam  (MOBIC ) 7.5 MG tablet Take 1 tablet (7.5 mg total) by mouth daily as needed. 11/25/22     methylPREDNISolone  (MEDROL  DOSEPAK) 4 MG TBPK tablet Take per  package instructions. (Taper dosing) 08/15/23   Brooks, Dana, DO  pyridOXINE  (VITAMIN B6) 100 MG tablet Take 1 tablet (100 mg total) by mouth daily. 01/10/23   Brooks, Dana, DO  Zoster Vaccine Adjuvanted (SHINGRIX ) injection Inject 0.5 mLs into the muscle. 12/01/22         Allergies    Latex    Review of Systems   Review of Systems  Cardiovascular:  Positive for chest pain.  Gastrointestinal:  Positive for abdominal pain and vomiting.  Genitourinary:  Positive for flank pain.  Neurological:  Positive for dizziness.    Physical Exam Updated Vital Signs BP 124/76   Pulse 77   Temp 98.3 F (36.8 C) (Oral) Comment: Simultaneous filing. User may not have seen previous data. Comment (Src): Simultaneous filing. User may not have seen previous data.  Resp 16   Ht 5' 11 (1.803 m)   Wt 86.2 kg   SpO2 98%   BMI 26.50 kg/m  Physical Exam Vitals and nursing note reviewed.  Constitutional:      General: He is not in acute distress.    Appearance: He is not toxic-appearing.  HENT:     Head: Normocephalic.  Cardiovascular:     Rate and Rhythm: Normal rate and regular rhythm.  Pulmonary:     Effort: Pulmonary effort is normal.  Breath sounds: Normal breath sounds.  Abdominal:     General: Abdomen is flat.     Palpations: Abdomen is soft.     Tenderness: There is abdominal tenderness in the right lower quadrant and periumbilical area.  Skin:    General: Skin is warm and dry.     Capillary Refill: Capillary refill takes less than 2 seconds.  Neurological:     Mental Status: He is alert.  Psychiatric:        Mood and Affect: Mood normal.        Behavior: Behavior normal.     ED Results / Procedures / Treatments   Labs (all labs ordered are listed, but only abnormal results are displayed) Labs Reviewed  COMPREHENSIVE METABOLIC PANEL - Abnormal; Notable for the following components:      Result Value   Glucose, Bld 140 (*)    GFR, Estimated 59 (*)    All other components  within normal limits  CBC - Abnormal; Notable for the following components:   WBC 10.6 (*)    All other components within normal limits  URINALYSIS, ROUTINE W REFLEX MICROSCOPIC - Abnormal; Notable for the following components:   Hgb urine dipstick SMALL (*)    Ketones, ur 20 (*)    Protein, ur 100 (*)    All other components within normal limits  I-STAT CG4 LACTIC ACID, ED - Abnormal; Notable for the following components:   Lactic Acid, Venous 2.9 (*)    All other components within normal limits  I-STAT CHEM 8, ED - Abnormal; Notable for the following components:   Glucose, Bld 123 (*)    Calcium , Ion 1.10 (*)    All other components within normal limits  LIPASE, BLOOD  CBG MONITORING, ED  I-STAT CG4 LACTIC ACID, ED  TROPONIN I (HIGH SENSITIVITY)  TROPONIN I (HIGH SENSITIVITY)    EKG EKG Interpretation Date/Time:  Saturday September 23 2023 14:08:37 EST Ventricular Rate:  71 PR Interval:  158 QRS Duration:  86 QT Interval:  404 QTC Calculation: 439 R Axis:   35  Text Interpretation: Normal sinus rhythm Normal ECG When compared with ECG of 22-Nov-2021 06:32, PREVIOUS ECG IS PRESENT Confirmed by Neysa Clap [phone]) on 09/23/2023 6:19:01 PM  Radiology CT Angio Chest/Abd/Pel for Dissection W and/or Wo Contrast Result Date: 09/23/2023 CLINICAL DATA:  Acute aortic syndrome suspected EXAM: CT ANGIOGRAPHY CHEST, ABDOMEN AND PELVIS TECHNIQUE: Non-contrast CT of the chest was initially obtained. Multidetector CT imaging through the chest, abdomen and pelvis was performed using the standard protocol during bolus administration of intravenous contrast. Multiplanar reconstructed images and MIPs were obtained and reviewed to evaluate the vascular anatomy. RADIATION DOSE REDUCTION: This exam was performed according to the departmental dose-optimization program which includes automated exposure control, adjustment of the mA and/or kV according to patient size and/or use of iterative  reconstruction technique. CONTRAST:  OMNIPAQUE  IOHEXOL  350 MG/ML SOLN COMPARISON:  CT abdomen pelvis, 11/22/2021 FINDINGS: CTA CHEST FINDINGS VASCULAR Aorta: Satisfactory opacification of the aorta. Normal contour and caliber of the thoracic aorta. No evidence of aneurysm, dissection, or other acute aortic pathology. Scattered aortic atherosclerosis. Cardiovascular: No evidence of pulmonary embolism on limited non-tailored examination. Normal heart size. Scattered left coronary artery calcifications no pericardial effusion. Review of the MIP images confirms the above findings. NON VASCULAR Mediastinum/Nodes: No enlarged mediastinal, hilar, or axillary lymph nodes. Thyroid  gland, trachea, and esophagus demonstrate no significant findings. Lungs/Pleura: Lungs are clear. No pleural effusion or pneumothorax. Musculoskeletal: No chest wall  abnormality. No acute osseous findings. Review of the MIP images confirms the above findings. CTA ABDOMEN AND PELVIS FINDINGS VASCULAR Normal contour and caliber of the abdominal aorta. No evidence of aneurysm, dissection, or other acute aortic pathology. 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 Mild mixed calcific atherosclerosis. Review of the MIP images confirms the above findings. NON-VASCULAR Hepatobiliary: No solid liver abnormality is seen. No gallstones, gallbladder wall thickening, or biliary dilatation. Pancreas: Unremarkable. No pancreatic ductal dilatation or surrounding inflammatory changes. Spleen: Normal in size without significant abnormality. 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 Nonobstructive right renal calculi. No ureteral calculi or hydronephrosis. Bladder is unremarkable. Stomach/Bowel: Stomach is within  normal limits. Appendix appears normal. No evidence of bowel wall thickening, distention, or inflammatory changes. Sigmoid diverticulosis. Lymphatic: No enlarged abdominal or pelvic lymph nodes. Reproductive: Severe prostatomegaly. Other: No abdominal wall hernia or abnormality. No ascites. Musculoskeletal: No acute osseous findings. Please note that definitively benign incidental findings, functional findings, and anatomic variants may have been intentionally omitted from this report in the interest of brevity and clarity. If not specifically noted and recommended in the impression, no further follow-up or characterization is required for incidental findings. IMPRESSION: 1. Normal contour and caliber of the thoracic and abdominal aorta. No evidence of aneurysm, dissection, or other acute aortic pathology. Mild mixed calcific atherosclerosis. 2. Coronary artery disease. 3. Severe prostatomegaly. 4. Sigmoid diverticulosis without evidence of acute diverticulitis. Electronically Signed   By: Marolyn JONETTA Jaksch M.D.   On: 09/23/2023 17:27   DG Chest Portable 1 View Result Date: 09/23/2023 CLINICAL DATA:  Chest pain, vomiting, and dizziness. EXAM: PORTABLE CHEST 1 VIEW COMPARISON:  Chest x-ray dated July 05, 2018. FINDINGS: The heart size and mediastinal contours are within normal limits. Both lungs are clear. The visualized skeletal structures are unremarkable. IMPRESSION: No active disease. Electronically Signed   By: Elsie ONEIDA Shoulder M.D.   On: 09/23/2023 15:39    Procedures Procedures    Medications Ordered in ED Medications  fentaNYL  (SUBLIMAZE ) injection 50 mcg (50 mcg Intravenous Given 09/23/23 1521)  iohexol  (OMNIPAQUE ) 350 MG/ML injection 100 mL (100 mLs Intravenous Contrast Given 09/23/23 1715)    ED Course/ Medical Decision Making/ A&P Clinical Course as of 09/23/23 2044  Sat Sep 23, 2023  1650 83 year old male presenting emergency department with sudden onset chest pain radiating to his right  flank.  Described as tightness.  Chest pain subsided, now having right side pain.  Noted feeling quite lightheaded with symptom onset with some blurred vision.  Improved currently.  No vertigo.  No headache, vision changes, unilateral weakness facial droop.  Denies cardiac history.  States pain somewhat improved.  Offered pain medications, but declined. [TY]  1744 CT Angio Chest/Abd/Pel for Dissection W and/or Wo Contrast IMPRESSION: 1. Normal contour and caliber of the thoracic and abdominal aorta. No evidence of aneurysm, dissection, or other acute aortic pathology. Mild mixed calcific atherosclerosis. 2. Coronary artery disease. 3. Severe prostatomegaly. 4. Sigmoid diverticulosis without evidence of acute diverticulitis   [TY]  1850 Workup today largely reassuring.  No fever or tachycardia or hemodynamic instability.  Maintain ox saturation on room air.  Soft abdomen with minimal tenderness on exam.  Given his complaint of chest pain concern for possible dissection.  However CTA negative and without other acute abnormality.  He has borderline  elevation in his white blood cell count, but no fever or tachycardia to suggest systemic infection.  He had elevated lactate, that improved without IV fluids.  Troponin negative x 2, EKG without ST segment changes to indicate ischemia on my independent interpretation.  ACS less likely.  Comprehensive metabolic panel with no significant metabolic derangements.  Normal kidney function.  No transaminitis to suggest PAD biliary disease.  Lipase normal.  Pancreatitis unlikely.  Chest x-ray without pneumonia pneumothorax my independent interpretation.  Radiology also reading x-ray with no acute pathology.  Patient was given a single dose of fentanyl  here.  Reports symptoms have nearly resolved.  Will attempt to ambulate patient and p.o. challenge.  If he remains asymptomatic, feel that he is appropriate for outpatient workup. [TY]  2044 Tolerated p.o.  Ambulated  without symptoms.  Shared decision making regarding admission for further workup versus discharge.  Patient to be discharged.  Will have patient follow-up with primary doctor. [TY]    Clinical Course User Index [TY] Neysa Caron PARAS, DO                                 Medical Decision Making This is an 83 year old male with significant past medical history other than gout and hyperlipidemia and panic attacks presenting to the emergency department for chest pain.  Afebrile slightly hypertensive, nontachycardic, maintaining oxygen saturation on room air.  Does not appear to be in acute distress on exam.  Will get broad cardiopulmonary workup.  Concern for possible dissection given chest pain with radiation to his abdomen.  Will get CTA.  See ED course for further MDM/disposition.  Amount and/or Complexity of Data Reviewed Independent Historian:     Details: Family notes patient with stress with his afternoon after losing $1200 External Data Reviewed:     Details: Normal echo 2022 Labs: ordered. Decision-making details documented in ED Course. Radiology: ordered. Decision-making details documented in ED Course.  Risk Prescription drug management. Decision regarding hospitalization.         Final Clinical Impression(s) / ED Diagnoses Final diagnoses:  Near syncope  Generalized abdominal pain    Rx / DC Orders ED Discharge Orders     None         Neysa Caron PARAS, DO 09/23/23 2044

## 2023-10-31 DIAGNOSIS — M7661 Achilles tendinitis, right leg: Secondary | ICD-10-CM | POA: Diagnosis not present

## 2023-10-31 DIAGNOSIS — M7660 Achilles tendinitis, unspecified leg: Secondary | ICD-10-CM | POA: Diagnosis not present

## 2023-10-31 DIAGNOSIS — M65269 Calcific tendinitis, unspecified lower leg: Secondary | ICD-10-CM | POA: Diagnosis not present

## 2023-10-31 DIAGNOSIS — M7662 Achilles tendinitis, left leg: Secondary | ICD-10-CM | POA: Diagnosis not present

## 2023-11-07 ENCOUNTER — Other Ambulatory Visit (HOSPITAL_COMMUNITY): Payer: Self-pay

## 2023-11-13 DIAGNOSIS — H401131 Primary open-angle glaucoma, bilateral, mild stage: Secondary | ICD-10-CM | POA: Diagnosis not present

## 2023-11-13 DIAGNOSIS — H2511 Age-related nuclear cataract, right eye: Secondary | ICD-10-CM | POA: Diagnosis not present

## 2023-11-13 DIAGNOSIS — H35352 Cystoid macular degeneration, left eye: Secondary | ICD-10-CM | POA: Diagnosis not present

## 2023-11-13 DIAGNOSIS — Z961 Presence of intraocular lens: Secondary | ICD-10-CM | POA: Diagnosis not present

## 2023-11-13 DIAGNOSIS — H35371 Puckering of macula, right eye: Secondary | ICD-10-CM | POA: Diagnosis not present

## 2023-11-23 DIAGNOSIS — H35372 Puckering of macula, left eye: Secondary | ICD-10-CM | POA: Diagnosis not present

## 2023-11-23 DIAGNOSIS — H35371 Puckering of macula, right eye: Secondary | ICD-10-CM | POA: Diagnosis not present

## 2023-11-23 DIAGNOSIS — H4720 Unspecified optic atrophy: Secondary | ICD-10-CM | POA: Diagnosis not present

## 2023-11-23 DIAGNOSIS — Z961 Presence of intraocular lens: Secondary | ICD-10-CM | POA: Diagnosis not present

## 2023-11-23 DIAGNOSIS — H2511 Age-related nuclear cataract, right eye: Secondary | ICD-10-CM | POA: Diagnosis not present

## 2023-12-05 ENCOUNTER — Encounter: Payer: Self-pay | Admitting: Family Medicine

## 2023-12-05 ENCOUNTER — Ambulatory Visit: Admitting: Family Medicine

## 2023-12-05 VITALS — BP 120/80 | HR 75 | Temp 98.5°F | Ht 71.0 in | Wt 187.2 lb

## 2023-12-05 DIAGNOSIS — N401 Enlarged prostate with lower urinary tract symptoms: Secondary | ICD-10-CM | POA: Diagnosis not present

## 2023-12-05 DIAGNOSIS — M109 Gout, unspecified: Secondary | ICD-10-CM | POA: Insufficient documentation

## 2023-12-05 DIAGNOSIS — M1A9XX0 Chronic gout, unspecified, without tophus (tophi): Secondary | ICD-10-CM | POA: Diagnosis not present

## 2023-12-05 DIAGNOSIS — R300 Dysuria: Secondary | ICD-10-CM | POA: Diagnosis not present

## 2023-12-05 LAB — URINALYSIS, ROUTINE W REFLEX MICROSCOPIC
Bilirubin Urine: NEGATIVE
Glucose, UA: NEGATIVE
Hyaline Cast: NONE SEEN /LPF
Ketones, ur: NEGATIVE
Nitrite: NEGATIVE
Protein, ur: NEGATIVE
Specific Gravity, Urine: 1.01 (ref 1.001–1.035)
pH: 6 (ref 5.0–8.0)

## 2023-12-05 LAB — MICROSCOPIC MESSAGE

## 2023-12-05 MED ORDER — CIPROFLOXACIN HCL 500 MG PO TABS: 500.0000 mg | ORAL_TABLET | Freq: Two times a day (BID) | ORAL | 0 refills | Status: AC

## 2023-12-05 NOTE — Progress Notes (Signed)
 Patient Office Visit  Assessment & Plan:  Dysuria -     Urinalysis, Routine w reflex microscopic -     Microscopic Message -     Urine Culture; Future -     Ciprofloxacin  HCl; Take 1 tablet (500 mg total) by mouth 2 (two) times daily for 10 days.  Dispense: 20 tablet; Refill: 0  Benign prostatic hyperplasia with lower urinary tract symptoms, symptom details unspecified  Chronic gout without tophus, unspecified cause, unspecified site -     Uric acid   Follow-up on urine culture results.  Stay well-hydrated.  If no improvement or worsening symptoms he is to notify us . No follow-ups on file.   Subjective:    Patient ID: John Evans, male    DOB: Jun 12, 1941  Age: 83 y.o. MRN: [id number]  Chief Complaint  Patient presents with   Dysuria    X2 days.     HPI Possible UTI? - having Dysuria x 2 days, no fever or chills.  No nausea no vomiting or back pain.  No gross hematuria. History of BPH. Patient does not have history of UTIs or prostatitis. Pt does drink plenty of water. Pt does see Urology Dr. Sherrine Dolly at [facility] about once per year. Pt taking Finasteride  which has been very helpful.  Gout- pt taking Uloric  40mg  once per day. Pt having more gouty attacks recently and does not know why. Pt is careful with his diet and knows the triggers. Pt wonders if his uric acid is elevated. Would like to have it checked today.      The ASCVD Risk score (Arnett DK, et al., 2019) failed to calculate for the following reasons:   The 2019 ASCVD risk score is only valid for ages 83 to 53  Past Medical History:  Diagnosis Date   BPH (benign prostatic hyperplasia)    Gout    Hyperlipidemia    Patient reportedly had a coronary calcium  score of 21 last year I cannot find his report   Hypertension    Insomnia    Obstructive sleep apnea    Panic attacks    Past Surgical History:  Procedure Laterality Date   BACK SURGERY     EYE SURGERY     Social History   Tobacco Use    Smoking status: Never   Smokeless tobacco: Never  Substance Use Topics   Alcohol use: No   Drug use: No   Family History  Problem Relation Age of Onset   Cancer Father        throat   Esophageal cancer Father    Diabetes Paternal Grandmother    Colon cancer Neg Hx    Allergies  Allergen Reactions   Latex Rash    ROS    Objective:    BP 120/80   Pulse 75   Temp 98.5 F (36.9 C)   Ht 5\' 11"  (1.803 m)   Wt 187 lb 4 oz (84.9 kg)   SpO2 98%   BMI 26.12 kg/m  BP Readings from Last 3 Encounters:  12/05/23 120/80  09/23/23 124/76  10/21/22 128/80   Wt Readings from Last 3 Encounters:  12/05/23 187 lb 4 oz (84.9 kg)  09/23/23 190 lb (86.2 kg)  10/21/22 194 lb (88 kg)    Physical Exam Vitals and nursing note reviewed.  Constitutional:      General: He is not in acute distress.    Appearance: Normal appearance.  HENT:     Head: Normocephalic.  Right Ear: Tympanic membrane and ear canal normal.     Left Ear: Tympanic membrane and ear canal normal.  Eyes:     Pupils: Pupils are equal, round, and reactive to light.  Cardiovascular:     Rate and Rhythm: Normal rate and regular rhythm.     Heart sounds: Normal heart sounds.  Pulmonary:     Effort: Pulmonary effort is normal.     Breath sounds: Normal breath sounds.  Abdominal:     Tenderness: There is no abdominal tenderness. There is no right CVA tenderness, left CVA tenderness or guarding.  Neurological:     General: No focal deficit present.     Mental Status: He is alert and oriented to person, place, and time.  Psychiatric:        Mood and Affect: Mood normal.        Behavior: Behavior normal.      Results for orders placed or performed in visit on 12/05/23  Urinalysis, Routine w reflex microscopic  Result Value Ref Range   Color, Urine LIGHT YELLOW YELLOW   APPearance SLIGHTLY CLOUDY (A) CLEAR   Specific Gravity, Urine 1.010 1.001 - 1.035   pH 6.0 5.0 - 8.0   Glucose, UA NEGATIVE NEGATIVE    Bilirubin Urine NEGATIVE NEGATIVE   Ketones, ur NEGATIVE NEGATIVE   Hgb urine dipstick 3+ (A) NEGATIVE   Protein, ur NEGATIVE NEGATIVE   Nitrite NEGATIVE NEGATIVE   Leukocytes,Ua 3+ (A) NEGATIVE   WBC, UA 20-40 (A) 0 - 5 /HPF   RBC / HPF 10-20 (A) 0 - 2 /HPF   Squamous Epithelial / HPF 0-5 < OR = 5 /HPF   Bacteria, UA MODERATE (A) NONE SEEN /HPF   Hyaline Cast NONE SEEN NONE SEEN /LPF  Microscopic Message  Result Value Ref Range   Note    Uric Acid  Result Value Ref Range   Uric Acid, Serum 3.5 (L) 4.0 - 8.0 mg/dL

## 2023-12-06 ENCOUNTER — Encounter: Payer: Self-pay | Admitting: Family Medicine

## 2023-12-06 LAB — URINE CULTURE
MICRO NUMBER:: 16359506
Result:: NO GROWTH
SPECIMEN QUALITY:: ADEQUATE

## 2023-12-06 LAB — URIC ACID: Uric Acid, Serum: 3.5 mg/dL — ABNORMAL LOW (ref 4.0–8.0)

## 2023-12-07 DIAGNOSIS — H2511 Age-related nuclear cataract, right eye: Secondary | ICD-10-CM | POA: Diagnosis not present

## 2023-12-18 DIAGNOSIS — M65872 Other synovitis and tenosynovitis, left ankle and foot: Secondary | ICD-10-CM | POA: Diagnosis not present

## 2023-12-18 DIAGNOSIS — M7732 Calcaneal spur, left foot: Secondary | ICD-10-CM | POA: Diagnosis not present

## 2023-12-18 DIAGNOSIS — M7662 Achilles tendinitis, left leg: Secondary | ICD-10-CM | POA: Diagnosis not present

## 2023-12-18 DIAGNOSIS — I739 Peripheral vascular disease, unspecified: Secondary | ICD-10-CM | POA: Diagnosis not present

## 2023-12-18 DIAGNOSIS — M7731 Calcaneal spur, right foot: Secondary | ICD-10-CM | POA: Diagnosis not present

## 2023-12-25 DIAGNOSIS — M7661 Achilles tendinitis, right leg: Secondary | ICD-10-CM | POA: Diagnosis not present

## 2023-12-28 DIAGNOSIS — M7661 Achilles tendinitis, right leg: Secondary | ICD-10-CM | POA: Diagnosis not present

## 2024-01-09 DIAGNOSIS — M7661 Achilles tendinitis, right leg: Secondary | ICD-10-CM | POA: Diagnosis not present

## 2024-01-10 ENCOUNTER — Other Ambulatory Visit (HOSPITAL_COMMUNITY): Payer: Self-pay

## 2024-01-11 DIAGNOSIS — M79672 Pain in left foot: Secondary | ICD-10-CM | POA: Diagnosis not present

## 2024-01-11 DIAGNOSIS — R29898 Other symptoms and signs involving the musculoskeletal system: Secondary | ICD-10-CM | POA: Diagnosis not present

## 2024-01-11 DIAGNOSIS — M79671 Pain in right foot: Secondary | ICD-10-CM | POA: Diagnosis not present

## 2024-01-11 DIAGNOSIS — G8929 Other chronic pain: Secondary | ICD-10-CM | POA: Diagnosis not present

## 2024-01-12 ENCOUNTER — Other Ambulatory Visit (HOSPITAL_COMMUNITY): Payer: Self-pay

## 2024-01-16 ENCOUNTER — Ambulatory Visit: Payer: Self-pay

## 2024-01-16 DIAGNOSIS — M7661 Achilles tendinitis, right leg: Secondary | ICD-10-CM | POA: Diagnosis not present

## 2024-01-16 NOTE — Telephone Encounter (Signed)
 Chief Complaint: toe pain Symptoms: pain, swelling, redness Frequency: since Sunday Pertinent Negatives: Patient denies fever, vomiting, leg or calf swelling, rashes, swelling or redness elsewhere Disposition: [] ED /[] Urgent Care (no appt availability in office) / [x] Appointment(In office/virtual)/ []  [facility]/ [] Home Care/ [] Refused Recommended Disposition /[]  Mobile Bus/ []  Follow-up with PCP Additional Notes: Pt reports 7/10 pain to his R great toe with a hx of gout. Pt reports swelling and redness. Pt states he takes Uloric  but he continues to have flares every 3-4 wks. RN scheduled pt for tomorrow. RN advised pt if he worsens to go to the ED. Pt verbalized understanding.    Copied from CRM [id number]. Topic: Clinical - Red Word Triage >> Jan 16, 2024  1:56 PM Fonda T wrote: [facility] that prompted transfer to Nurse Triage: Patient states has excruciating pain in right big toe, with redness, burning and difficulty walking Reason for Disposition  [1] Swollen toe AND [2] no fever  (Exceptions: Just a localized bump from bunion, corns, insect bite, sting.)  Answer Assessment - Initial Assessment Questions 1. ONSET: "When did the pain start?"      Sunday - swelling, redness, difficulty walking, can't put pressure on it 2. LOCATION: "Where is the pain located?"   (e.g., around nail, entire toe, at foot joint)      R big toe 3. PAIN: "How bad is the pain?"    (Scale 1-10; or mild, moderate, severe)   -  MILD (1-3): doesn't interfere with normal activities    -  MODERATE (4-7): interferes with normal activities (e.g., work or school) or awakens from sleep, limping    -  SEVERE (8-10): excruciating pain, unable to do any normal activities, unable to walk     7-8/10 4. APPEARANCE: "What does the toe look like?" (e.g., redness, swelling, bruising, pallor)     Red, swollen  5. CAUSE: "What do you think is causing the toe pain?"     Gout 6. OTHER SYMPTOMS: "Do you have any  other symptoms?" (e.g., leg pain, rash, fever, numbness)     Tylenol , motrin, aleve - did not work. Denies leg swelling. Denies fever. Endorses "a little" numbness. Denies rashes or other areas of swelling. Hx of gout  Protocols used: Toe Pain-A-AH

## 2024-01-17 ENCOUNTER — Ambulatory Visit (INDEPENDENT_AMBULATORY_CARE_PROVIDER_SITE_OTHER): Admitting: Family Medicine

## 2024-01-17 ENCOUNTER — Encounter: Payer: Self-pay | Admitting: Family Medicine

## 2024-01-17 VITALS — BP 122/72 | HR 101 | Temp 98.5°F | Ht 71.0 in | Wt 188.2 lb

## 2024-01-17 DIAGNOSIS — M109 Gout, unspecified: Secondary | ICD-10-CM

## 2024-01-17 MED ORDER — PREDNISONE 10 MG (21) PO TBPK: ORAL_TABLET | ORAL | 0 refills | Status: DC

## 2024-01-17 MED ORDER — HYDROCODONE-ACETAMINOPHEN 10-325 MG PO TABS: 1.0000 | ORAL_TABLET | Freq: Three times a day (TID) | ORAL | 0 refills | Status: AC | PRN

## 2024-01-17 NOTE — Progress Notes (Unsigned)
 Patient Office Visit  Assessment & Plan:  Acute gout involving toe of right foot, unspecified cause -     predniSONE ; Use as directed.  Dispense: 21 each; Refill: 0 -     HYDROcodone -Acetaminophen ; Take 1 tablet by mouth every 8 (eight) hours as needed for up to 5 days.  Dispense: 15 tablet; Refill: 0  Gouty arthritis of foot   Assessment and Plan    Acute Gout Attack Acute gout attack in the right big toe with severe pain, swelling, redness, and warmth. On Uloric  but still experiencing flare-ups. Uric acid levels not always indicative of activity. Prednisone  effective without affecting kidney function. Declines narcotics but open to hydrocodone  for severe pain. Tramadol  ineffective, hydrocodone  effective previously. - Prescribed prednisone  for inflammation and pain management. - Prescribed hydrocodone  for severe pain, as needed. - Continue Uloric  for long-term management. - Avoid NSAIDs due to kidney concerns.  Achilles Tendonitis Chronic Achilles tendonitis with heel pain. Undergoing physical therapy. Prednisone  may alleviate pain. - Continue physical therapy. - Use prednisone  for pain management.          No follow-ups on file.   Subjective:     Patient ID: John Evans, male    DOB: 16-Jul-1941  Age: 83 y.o. MRN: [id number]  Chief Complaint  Patient presents with   Toe Pain    Right toe pain x 3 days. Hx of gout.     Toe Pain    Discussed the use of AI scribe software for clinical note transcription with the patient, who gave verbal consent to proceed.  History of Present Illness         John Evans is an 83 year old male with gout who presents with an acute gout attack in the right toe.  He has been experiencing an acute gout attack in his right toe since Saturday night, with no known triggers such as dietary changes, dehydration, or new medications. He has been on Uloric  for two to three years and typically experiences gout attacks a couple of  times a year, with the last one occurring in January or February of this year. The current attack is worsening, with increased swelling, redness, and warmth in the toe, making it difficult to wear shoes and causing significant pain rated at an 8 out of 10. The pain disrupts his sleep and affects his ability to walk.  He follows a restricted diet to avoid gout triggers, consuming mainly chicken and fish, and avoids foods like shrimp, beef, pork, and sardines. Despite these dietary precautions, he continues to experience gout attacks.  He has a history of kidney function issues and avoids NSAIDs like Advil and Aleve due to potential kidney impact. He finds prednisone  effective without causing insomnia and takes it away from dinner time. He has tried Tylenol  and previously used tramadol  and hydrocodone , noting that tramadol  was ineffective while hydrocodone  provided some relief.  He also has a history of Achilles tendonitis in his heel, for which he has been undergoing physical therapy. He mentions a previous urinary tract infection and a uric acid test that was normal despite a recent gout attack.  No fever, chills, or joint infection. He reports increased heart rate due to pain but no irregular heartbeats. He is a retired Teacher, early years/pre and is knowledgeable about his medications and condition. Physical Exam CARDIOVASCULAR: Tachycardia with regular rhythm. Results LABS Uric acid: within normal limits (January 2025) Assessment & Plan Acute Gout Attack Acute gout attack in the right big toe  with severe pain, swelling, redness, and warmth. On Uloric  but still experiencing flare-ups. Uric acid levels not always indicative of activity. Prednisone  effective without affecting kidney function. Declines narcotics but open to hydrocodone  for severe pain. Tramadol  ineffective, hydrocodone  effective previously. - Prescribed prednisone  for inflammation and pain management. - Prescribed hydrocodone  for severe pain, as  needed. - Continue Uloric  for long-term management. - Avoid NSAIDs due to kidney concerns.  Achilles Tendonitis Chronic Achilles tendonitis with heel pain. Undergoing physical therapy. Prednisone  may alleviate pain. - Continue physical therapy. - Use prednisone  for pain management.    The ASCVD Risk score (Arnett DK, et al., 2019) failed to calculate for the following reasons:   The 2019 ASCVD risk score is only valid for ages 60 to 71  Past Medical History:  Diagnosis Date   BPH (benign prostatic hyperplasia)    Gout    Hyperlipidemia    Patient reportedly had a coronary calcium  score of 21 last year I cannot find his report   Hypertension    Insomnia    Obstructive sleep apnea    Panic attacks    Past Surgical History:  Procedure Laterality Date   BACK SURGERY     EYE SURGERY     Social History   Tobacco Use   Smoking status: Never   Smokeless tobacco: Never  Substance Use Topics   Alcohol use: No   Drug use: No   Family History  Problem Relation Age of Onset   Cancer Father        throat   Esophageal cancer Father    Diabetes Paternal Grandmother    Colon cancer Neg Hx    Allergies  Allergen Reactions   Latex Rash    ROS    Objective:    BP 122/72   Pulse (!) 101   Temp 98.5 F (36.9 C)   Ht 5\' 11"  (1.803 m)   Wt 188 lb 4 oz (85.4 kg)   SpO2 99%   BMI 26.26 kg/m  BP Readings from Last 3 Encounters:  01/17/24 122/72  12/05/23 120/80  09/23/23 124/76   Wt Readings from Last 3 Encounters:  01/17/24 188 lb 4 oz (85.4 kg)  12/05/23 187 lb 4 oz (84.9 kg)  09/23/23 190 lb (86.2 kg)    Physical Exam Vitals and nursing note reviewed.  Constitutional:      Appearance: Normal appearance.  HENT:     Head: Normocephalic.  Eyes:     Pupils: Pupils are equal, round, and reactive to light.  Cardiovascular:     Rate and Rhythm: Regular rhythm. Tachycardia present.     Heart sounds: Normal heart sounds.  Pulmonary:     Effort: Pulmonary effort  is normal.     Breath sounds: Normal breath sounds.  Musculoskeletal:     Right lower leg: No edema.     Left lower leg: No edema.     Right foot: Decreased range of motion. Swelling, laceration, tenderness and bony tenderness present.     Comments: Right great toe swelling, red and warm to the touch  Neurological:     General: No focal deficit present.     Mental Status: He is alert and oriented to person, place, and time.      No results found for any visits on 01/17/24.  {Labs (Optional):23779}

## 2024-01-22 ENCOUNTER — Other Ambulatory Visit (HOSPITAL_COMMUNITY): Payer: Self-pay

## 2024-01-22 ENCOUNTER — Ambulatory Visit: Admitting: Family Medicine

## 2024-01-22 ENCOUNTER — Encounter: Payer: Self-pay | Admitting: Family Medicine

## 2024-01-22 VITALS — BP 142/87 | HR 68 | Ht 71.0 in | Wt 184.0 lb

## 2024-01-22 DIAGNOSIS — F439 Reaction to severe stress, unspecified: Secondary | ICD-10-CM

## 2024-01-22 MED ORDER — COLCHICINE 0.6 MG PO TABS: 0.6000 mg | ORAL_TABLET | Freq: Every day | ORAL | 5 refills | Status: DC

## 2024-01-22 MED ORDER — ALPRAZOLAM 0.5 MG PO TABS: 0.5000 mg | ORAL_TABLET | Freq: Two times a day (BID) | ORAL | 0 refills | Status: DC | PRN

## 2024-01-22 NOTE — Progress Notes (Signed)
 Subjective:    Patient ID: John Evans, male    DOB: Jul 10, 1941, 83 y.o.   MRN: [id number] Patient is currently on Uloric .  His most recent uric acid level in April was 3.7.  However he continues to have gout exacerbations once a month.  The exacerbation typically last week.  The pain can be intense.  It primarily affects the joints of his feet.  He is pain-free today but he is curious if there is anything else he can do to prevent gout attacks.  His wife is battling dementia.  She often deals with sundowning.  Patient states at night, he gets extremely frustrated at times due to having to constantly repeat himself and deal with her disorientation.  He then feels extremely guilty when he loses his temper and says something mean.  He denies depression.  However he is "tired and worn out" helping to manage her dementia single-handedly.  He would like medicine that he can take on an as-needed basis to help him relax or go to sleep.  He wants to use the medicine sparingly. Past Medical History:  Diagnosis Date   BPH (benign prostatic hyperplasia)    Gout    Hyperlipidemia    Patient reportedly had a coronary calcium  score of 21 last year I cannot find his report   Hypertension    Insomnia    Obstructive sleep apnea    Panic attacks    Past Surgical History:  Procedure Laterality Date   BACK SURGERY     EYE SURGERY     Current Outpatient Medications on File Prior to Visit  Medication Sig Dispense Refill   febuxostat  (ULORIC ) 40 MG tablet Take 40 mg by mouth daily.     finasteride  (PROSCAR ) 5 MG tablet Take 1 tablet (5 mg total) by mouth daily. 90 tablet 3   HYDROcodone -acetaminophen  (NORCO) 10-325 MG tablet Take 1 tablet by mouth every 8 (eight) hours as needed for up to 5 days. (Patient not taking: Reported on 01/22/2024) 15 tablet 0   predniSONE  (STERAPRED UNI-PAK 21 TAB) 10 MG (21) TBPK tablet Use as directed. (Patient not taking: Reported on 01/22/2024) 21 each 0   No current  facility-administered medications on file prior to visit.   Allergies  Allergen Reactions   Alpha-Gal Diarrhea and Nausea Only   Latex Rash   Social History   Socioeconomic History   Marital status: Married    Spouse name: Libby Ree   Number of children: 2   Years of education: Not on file   Highest education level: Not on file  Occupational History   Occupation: 2  Tobacco Use   Smoking status: Never   Smokeless tobacco: Never  Vaping Use   Vaping status: Not on file  Substance and Sexual Activity   Alcohol use: No   Drug use: No   Sexual activity: Yes    Comment: married, retired Teacher, early years/pre  Other Topics Concern   Not on file  Social History Narrative   Married x 55 years.    4 grandchildren   Social Drivers of Corporate investment banker Strain: Low Risk  (10/31/2023)   Received from [facility]   Overall Financial Resource Strain (CARDIA)    Difficulty of Paying Living Expenses: Not hard at all  Food Insecurity: No Food Insecurity (10/31/2023)   Received from [facility]   Hunger Vital Sign    Worried About Running Out of Food in the Last Year: Never true  Ran Out of Food in the Last Year: Never true  Transportation Needs: No Transportation Needs (10/31/2023)   Received from [facility] - Transportation    In the past 12 months, has lack of transportation kept you from medical appointments or from getting medications?: No    Lack of Transportation (Non-Medical): No  Physical Activity: Inactive (08/26/2021)   Exercise Vital Sign    Days of Exercise per Week: 0 days    Minutes of Exercise per Session: 0 min  Stress: No Stress Concern Present (08/26/2021)   [employer] of Occupational Health - Occupational Stress Questionnaire    Feeling of Stress : Not at all  Social Connections: Socially Integrated (08/26/2021)   Social Connection and Isolation Panel [NHANES]    Frequency of Communication with  Friends and Family: More than three times a week    Frequency of Social Gatherings with Friends and Family: More than three times a week    Attends Religious Services: More than 4 times per year    Active Member of [employer] or Organizations: Yes    Attends Engineer, structural: More than 4 times per year    Marital Status: Married  Catering manager Violence: Not At Risk (08/26/2021)   Humiliation, Afraid, Rape, and Kick questionnaire    Fear of Current or Ex-Partner: No    Emotionally Abused: No    Physically Abused: No    Sexually Abused: No       Review of Systems     Objective:   Physical Exam Constitutional:      General: He is not in acute distress.    Appearance: Normal appearance. He is not ill-appearing or toxic-appearing.  Cardiovascular:     Rate and Rhythm: Normal rate.     Heart sounds: Normal heart sounds. No murmur heard. Pulmonary:     Effort: Pulmonary effort is normal.     Breath sounds: Normal breath sounds.  Musculoskeletal:     Right foot: Swelling present.  Neurological:     Mental Status: He is alert.           Assessment & Plan:  Situational stress First, regarding his scalp, we are going to add colchicine  0.6 mg daily to the Uloric .  He will take both of these medications as "preventatives" for his gout and see if this reduces the frequency of exacerbations.  Second, the patient is dealing with situational stress.  My heart goes out to him.  He feels extremely guilty but is trying to do the best that he can.  I will give the patient Xanax .  He can use the medication sparingly as needed to help with sleep.  I recommended trying half the dose the first few times and try to use the lowest effective amount.  He will use the medicine only when necessary.  I warned him to watch for dizziness or confusion

## 2024-01-23 DIAGNOSIS — M7661 Achilles tendinitis, right leg: Secondary | ICD-10-CM | POA: Diagnosis not present

## 2024-01-29 DIAGNOSIS — M7661 Achilles tendinitis, right leg: Secondary | ICD-10-CM | POA: Diagnosis not present

## 2024-02-02 ENCOUNTER — Other Ambulatory Visit: Payer: Self-pay | Admitting: Family Medicine

## 2024-02-02 ENCOUNTER — Other Ambulatory Visit: Payer: Self-pay

## 2024-02-02 ENCOUNTER — Other Ambulatory Visit (HOSPITAL_COMMUNITY): Payer: Self-pay

## 2024-02-02 MED ORDER — FEBUXOSTAT 40 MG PO TABS
40.0000 mg | ORAL_TABLET | Freq: Every day | ORAL | 3 refills | Status: AC
  Filled 2024-02-02: qty 90, 90d supply, fill #0
  Filled 2024-04-28: qty 90, 90d supply, fill #1
  Filled 2024-07-27: qty 90, 90d supply, fill #2

## 2024-02-05 DIAGNOSIS — M7661 Achilles tendinitis, right leg: Secondary | ICD-10-CM | POA: Diagnosis not present

## 2024-02-08 ENCOUNTER — Encounter (HOSPITAL_BASED_OUTPATIENT_CLINIC_OR_DEPARTMENT_OTHER): Payer: Self-pay | Admitting: *Deleted

## 2024-02-08 ENCOUNTER — Emergency Department (HOSPITAL_BASED_OUTPATIENT_CLINIC_OR_DEPARTMENT_OTHER)

## 2024-02-08 ENCOUNTER — Emergency Department (HOSPITAL_BASED_OUTPATIENT_CLINIC_OR_DEPARTMENT_OTHER)
Admission: EM | Admit: 2024-02-08 | Discharge: 2024-02-08 | Disposition: A | Discharge status: Home / Self Care | Attending: Emergency Medicine | Admitting: Emergency Medicine

## 2024-02-08 ENCOUNTER — Other Ambulatory Visit: Payer: Self-pay

## 2024-02-08 ENCOUNTER — Ambulatory Visit: Payer: Self-pay

## 2024-02-08 DIAGNOSIS — N132 Hydronephrosis with renal and ureteral calculous obstruction: Secondary | ICD-10-CM | POA: Diagnosis not present

## 2024-02-08 DIAGNOSIS — N200 Calculus of kidney: Secondary | ICD-10-CM

## 2024-02-08 DIAGNOSIS — Z91040 Latex allergy status: Secondary | ICD-10-CM | POA: Insufficient documentation

## 2024-02-08 DIAGNOSIS — T675XXA Heat exhaustion, unspecified, initial encounter: Secondary | ICD-10-CM | POA: Insufficient documentation

## 2024-02-08 DIAGNOSIS — R55 Syncope and collapse: Secondary | ICD-10-CM | POA: Diagnosis not present

## 2024-02-08 DIAGNOSIS — K5730 Diverticulosis of large intestine without perforation or abscess without bleeding: Secondary | ICD-10-CM | POA: Diagnosis not present

## 2024-02-08 DIAGNOSIS — D3502 Benign neoplasm of left adrenal gland: Secondary | ICD-10-CM | POA: Diagnosis not present

## 2024-02-08 LAB — CBC WITH DIFFERENTIAL/PLATELET
Abs Immature Granulocytes: 0.03 10*3/uL (ref 0.00–0.07)
Basophils Absolute: 0 10*3/uL (ref 0.0–0.1)
Basophils Relative: 0 %
Eosinophils Absolute: 0.1 10*3/uL (ref 0.0–0.5)
Eosinophils Relative: 1 %
HCT: 44.7 % (ref 39.0–52.0)
Hemoglobin: 16 g/dL (ref 13.0–17.0)
Immature Granulocytes: 0 %
Lymphocytes Relative: 18 %
Lymphs Abs: 1.5 10*3/uL (ref 0.7–4.0)
MCH: 31.7 pg (ref 26.0–34.0)
MCHC: 35.8 g/dL (ref 30.0–36.0)
MCV: 88.7 fL (ref 80.0–100.0)
Monocytes Absolute: 0.7 10*3/uL (ref 0.1–1.0)
Monocytes Relative: 9 %
Neutro Abs: 6 10*3/uL (ref 1.7–7.7)
Neutrophils Relative %: 72 %
Platelets: 215 10*3/uL (ref 150–400)
RBC: 5.04 MIL/uL (ref 4.22–5.81)
RDW: 12.4 % (ref 11.5–15.5)
WBC: 8.4 10*3/uL (ref 4.0–10.5)
nRBC: 0 % (ref 0.0–0.2)

## 2024-02-08 LAB — COMPREHENSIVE METABOLIC PANEL WITH GFR
ALT: 17 U/L (ref 0–44)
AST: 27 U/L (ref 15–41)
Albumin: 3.9 g/dL (ref 3.5–5.0)
Alkaline Phosphatase: 95 U/L (ref 38–126)
Anion gap: 10 (ref 5–15)
BUN: 14 mg/dL (ref 8–23)
CO2: 26 mmol/L (ref 22–32)
Calcium: 9.6 mg/dL (ref 8.9–10.3)
Chloride: 103 mmol/L (ref 98–111)
Creatinine, Ser: 1.1 mg/dL (ref 0.61–1.24)
GFR, Estimated: >60 mL/min (ref >60)
Glucose, Bld: 97 mg/dL (ref 70–99)
Potassium: 4.1 mmol/L (ref 3.5–5.1)
Sodium: 139 mmol/L (ref 135–145)
Total Bilirubin: 0.7 mg/dL (ref 0.0–1.2)
Total Protein: 6.9 g/dL (ref 6.5–8.1)

## 2024-02-08 LAB — URINALYSIS, ROUTINE W REFLEX MICROSCOPIC
Bacteria, UA: NONE SEEN
Bilirubin Urine: NEGATIVE
Glucose, UA: NEGATIVE mg/dL
Ketones, ur: NEGATIVE mg/dL
Leukocytes,Ua: NEGATIVE
Nitrite: NEGATIVE
Protein, ur: NEGATIVE mg/dL
Specific Gravity, Urine: <1.005 — ABNORMAL LOW (ref 1.005–1.030)
pH: 6 (ref 5.0–8.0)

## 2024-02-08 LAB — LACTIC ACID, PLASMA: Lactic Acid, Venous: 1.8 mmol/L (ref 0.5–1.9)

## 2024-02-08 LAB — TROPONIN T, HIGH SENSITIVITY: Troponin T High Sensitivity: <15 ng/L (ref <19)

## 2024-02-08 LAB — LIPASE, BLOOD: Lipase: 25 U/L (ref 11–51)

## 2024-02-08 MED ORDER — TAMSULOSIN HCL 0.4 MG PO CAPS: 0.4000 mg | ORAL_CAPSULE | Freq: Every day | ORAL | 0 refills | Status: AC

## 2024-02-08 MED ORDER — KETOROLAC TROMETHAMINE 30 MG/ML IJ SOLN
15.0000 mg | Freq: Once | INTRAMUSCULAR | Status: AC
  Administered 2024-02-08: 15 mg via INTRAVENOUS
  Filled 2024-02-08: qty 1

## 2024-02-08 MED ORDER — IOHEXOL 300 MG/ML  SOLN
100.0000 mL | Freq: Once | INTRAMUSCULAR | Status: AC | PRN
  Administered 2024-02-08: 100 mL via INTRAVENOUS

## 2024-02-08 MED ORDER — HYDROCODONE-ACETAMINOPHEN 5-325 MG PO TABS: 1.0000 | ORAL_TABLET | Freq: Once | ORAL | Status: DC

## 2024-02-08 MED ORDER — HYDROCODONE-ACETAMINOPHEN 5-325 MG PO TABS: 1.0000 | ORAL_TABLET | ORAL | 0 refills | Status: DC | PRN

## 2024-02-08 MED ORDER — ONDANSETRON 4 MG PO TBDP: 4.0000 mg | ORAL_TABLET | ORAL | 0 refills | Status: DC | PRN

## 2024-02-08 NOTE — Discharge Instructions (Signed)
 1.  Follow-up with [facility] as soon as possible 2.  You may take ibuprofen over-the-counter per package instructions for your baseline pain control.  If you need additional pain control, take a hydrocodone  (Vicodin) tablet every 4 hours as prescribed. 3.  Take tamsulosin  (flomax ) daily. 4.  Return to the emergency department immediately if you have any fever, worsening pain, vomiting or other concerning changes.

## 2024-02-08 NOTE — ED Provider Notes (Signed)
 [facility] AT [facility] Provider Note   CSN: [id number] Arrival date & time: 02/08/24  1605     Patient presents with: Flank Pain and Near Syncope   John Evans is a 83 y.o. male.   HPI Patient reports that he spent time out working in the yard yesterday.  It was an exceptionally hot day with heat index mornings.  He reports when he went inside he started to feel extremely overheated and was pouring sweat.  Patient reports he felt lightheaded and got close to passing out yesterday if he came in his house.  Patient reports that he started to hydrate aggressively at home.  He has been drinking a lot of Gatorade.  He reports however after this episode he developed pain in the right flank.  He reports now he has had this ongoing fairly intense aching fullness pain in his right flank.  It is worse if he tries to lie on his side.  He denies shortness of breath or cough.  No fever.  He has not seen any blood in the urine.  He reports that the lightheadedness passed after he rehydrated and got out of the heat and he has not had any further syncope or near syncope.  He has not had any chest pain.  No palpitations.    Prior to Admission medications   Medication Sig Start Date End Date Taking? Authorizing Provider  HYDROcodone -acetaminophen  (NORCO/VICODIN) 5-325 MG tablet Take 1 tablet by mouth every 4 (four) hours as needed. 02/08/24  Yes Armenta Canning, MD  ondansetron  (ZOFRAN -ODT) 4 MG disintegrating tablet Take 1 tablet (4 mg total) by mouth every 4 (four) hours as needed for nausea or vomiting. 02/08/24  Yes Armenta Canning, MD  tamsulosin  (FLOMAX ) 0.4 MG CAPS capsule Take 1 capsule (0.4 mg total) by mouth daily. 02/08/24  Yes Armenta Canning, MD  ALPRAZolam  (XANAX ) 0.5 MG tablet Take 1 tablet (0.5 mg total) by mouth 2 (two) times daily as needed for anxiety. 01/22/24   Duanne Butler DASEN, MD  colchicine  0.6 MG tablet Take 1 tablet (0.6 mg total) by mouth daily.  01/22/24   Duanne Butler DASEN, MD  febuxostat  (ULORIC ) 40 MG tablet Take 40 mg by mouth daily.    [provider]  febuxostat  (ULORIC ) 40 MG tablet Take 1 tablet (40 mg total) by mouth daily. 02/02/24   Duanne Butler DASEN, MD  finasteride  (PROSCAR ) 5 MG tablet Take 1 tablet (5 mg total) by mouth daily. 05/08/23     predniSONE  (STERAPRED UNI-PAK 21 TAB) 10 MG (21) TBPK tablet Use as directed. Patient not taking: Reported on 01/22/2024 01/17/24   Aletha Bene, MD    Allergies: Alpha-gal and Latex    Review of Systems  Updated Vital Signs BP (!) 141/96   Pulse 64   Temp 98.7 F (37.1 C)   Resp 19   SpO2 98%   Physical Exam Constitutional:      Comments: Alert nontoxic clinically well appearance.  No respiratory distress well-nourished well-developed  HENT:     Mouth/Throat:     Pharynx: Oropharynx is clear.   Eyes:     Extraocular Movements: Extraocular movements intact.    Cardiovascular:     Rate and Rhythm: Normal rate and regular rhythm.     Heart sounds: Normal heart sounds.  Pulmonary:     Effort: Pulmonary effort is normal.     Breath sounds: Normal breath sounds.  Abdominal:     General: There is no  distension.     Palpations: Abdomen is soft.     Tenderness: There is no abdominal tenderness. There is no guarding.     Comments: Mild right flank tenderness to percussion.  No rash or soft tissue abnormality observable.   Musculoskeletal:        General: No swelling or tenderness. Normal range of motion.     Right lower leg: No edema.     Left lower leg: No edema.   Skin:    General: Skin is warm and dry.   Neurological:     General: No focal deficit present.     Mental Status: He is oriented to person, place, and time.     Motor: No weakness.     Coordination: Coordination normal.   Psychiatric:        Mood and Affect: Mood normal.     (all labs ordered are listed, but only abnormal results are displayed) Labs Reviewed  URINALYSIS, ROUTINE W REFLEX  MICROSCOPIC - Abnormal; Notable for the following components:      Result Value   Color, Urine COLORLESS (*)    Specific Gravity, Urine <1.005 (*)    Hgb urine dipstick TRACE (*)    All other components within normal limits  LACTIC ACID, PLASMA  CBC WITH DIFFERENTIAL/PLATELET  COMPREHENSIVE METABOLIC PANEL WITH GFR  LIPASE, BLOOD  LACTIC ACID, PLASMA  TROPONIN T, HIGH SENSITIVITY  TROPONIN T, HIGH SENSITIVITY    EKG: EKG Interpretation Date/Time:  Thursday February 08 2024 16:24:07 EDT Ventricular Rate:  77 PR Interval:  165 QRS Duration:  84 QT Interval:  378 QTC Calculation: 428 R Axis:   5  Text Interpretation: Sinus rhythm normal no sig change from previous Confirmed by Armenta Canning [phone]) on 02/08/2024 4:27:06 PM  Radiology: CT ABDOMEN PELVIS W CONTRAST Result Date: 02/08/2024 CLINICAL DATA:  Abdominal, right flank pain EXAM: CT ABDOMEN AND PELVIS WITH CONTRAST TECHNIQUE: Multidetector CT imaging of the abdomen and pelvis was performed using the standard protocol following bolus administration of intravenous contrast. RADIATION DOSE REDUCTION: This exam was performed according to the departmental dose-optimization program which includes automated exposure control, adjustment of the mA and/or kV according to patient size and/or use of iterative reconstruction technique. CONTRAST:  OMNIPAQUE  IOHEXOL  300 MG/ML  SOLN COMPARISON:  09/23/2023 FINDINGS: Lower chest: No acute abnormality. Hepatobiliary: Scattered cysts within the liver. No enhancing intrahepatic mass. Mild hepatic steatosis. No intra or extrahepatic biliary ductal dilation. Gallbladder unremarkable. Pancreas: Unremarkable Spleen: Unremarkable Adrenals/Urinary Tract: The right adrenal gland is unremarkable. Stable 12 mm benign left adrenal adenoma which no follow-up imaging is recommended. The kidneys are normal in size and position. Interval development of mild right hydronephrosis with an obstructing 2 mm calculus  within the right ureterovesicular junction. Additionally, a 5 mm calculus is seen dependently within the bladder lumen, migrated from the lower pole the right kidney. Additional mild right nonobstructing nephrolithiasis is present with punctate calculi measuring to 3 mm. No hydronephrosis on the left. Multiple cortical and peripelvic simple cysts are identified for which no follow-up imaging is recommended. No nephro or urolithiasis on the left. The bladder is otherwise unremarkable. Stomach/Bowel: Small gastric fundal diverticulum incidentally noted. The sigmoid colon demonstrates moderate diverticulosis without superimposed acute inflammatory change. Stomach, small bowel, and large bowel are otherwise unremarkable. Appendix normal. No free intraperitoneal gas or fluid. Vascular/Lymphatic: Aortic atherosclerosis. No enlarged abdominal or pelvic lymph nodes. Reproductive: Marked prostatic hypertrophy. Other: Tiny fat containing bilateral inguinal hernias. No abdominopelvic ascites Musculoskeletal:  No acute bone abnormality. No lytic or blastic bone lesion. Osseous structures are age appropriate. IMPRESSION: 1. Obstructing 2 mm calculus within the right ureterovesicular junction resulting in mild right hydronephrosis. 2. Additional nonobstructing right nephrolithiasis. 5 mm passed calculus within the bladder lumen, migrated from the lower pole the right kidney. 3. Marked prostatic hypertrophy. 4. Moderate sigmoid diverticulosis without superimposed acute inflammatory change. 5. Mild hepatic steatosis. 6. Stable benign left adrenal adenoma which no follow-up imaging is recommended. Aortic Atherosclerosis (ICD10-I70.0). Electronically Signed   By: Dorethia Molt M.D.   On: 02/08/2024 20:52     Procedures   Medications Ordered in the ED  ketorolac  (TORADOL ) 30 MG/ML injection 15 mg (has no administration in time range)  HYDROcodone -acetaminophen  (NORCO/VICODIN) 5-325 MG per tablet 1 tablet (has no administration  in time range)  iohexol  (OMNIPAQUE ) 300 MG/ML solution 100 mL (100 mLs Intravenous Contrast Given 02/08/24 1951)                                    Medical Decision Making Amount and/or Complexity of Data Reviewed Labs: ordered. Radiology: ordered.  Risk Prescription drug management.   Patient presents as outlined.  He had a near syncopal episode yesterday.  Patient had been outside working in the yard.  There is currently a significant heat wave with heat warnings.  Patient described sweating profusely and getting chills after that episode.  The symptoms subsequently subsided but patient reports he is continue to have pain in his flank and does feel some general malaise.  Differential diagnosis includes kidney stone\pyelonephritis\biliary colic\pneumonia\aortic dissection\heat exhaustion.  Will proceed with broad diagnostic evaluation.  Comprehensive metabolic panel normal GFR greater than 60 urinalysis negative lactic acid 1.8 troponin less than 15 CBC normal with normal differential.  CT abdomen pelvis reviewed by radiology positive for right sided 2 mm kidney stone with mild hydronephrosis.  At this time findings most consistent with heat exhaustion yesterday.  Patient had been working outside in the advisory temperatures and then had profuse sweating and lightheadedness.  Labs are normal today with no elevation in troponin.  Patient did not have chest pain.  He has not had a recurrence of near syncope.  He did incidentally develop right flank pain.  CT identifies a 2 mm kidney stone.  Patient had a kidney stone several years ago.  [facility].  He is tolerating this well.  At this time stable for discharge.  Will provide prescriptions for hydrocodone  and Zofran  and Flomax .  Return precautions reviewed.     Final diagnoses:  Kidney stone  Heat exhaustion, initial encounter    ED Discharge Orders          Ordered    ondansetron  (ZOFRAN -ODT) 4 MG  disintegrating tablet  Every 4 hours PRN        02/08/24 2124    HYDROcodone -acetaminophen  (NORCO/VICODIN) 5-325 MG tablet  Every 4 hours PRN        02/08/24 2124    tamsulosin  (FLOMAX ) 0.4 MG CAPS capsule  Daily        02/08/24 2124               Armenta Canning, MD 02/08/24 2129

## 2024-02-08 NOTE — Telephone Encounter (Signed)
 FYI Only or Action Required?: FYI only for provider.  Patient was last seen in primary care on 01/22/2024 by Duanne Butler DASEN, MD. Called Nurse Triage reporting Heat Exposure. Symptoms began yesterday. Interventions attempted: Rest, hydration, or home remedies. Symptoms are: rapidly worsening.  Triage Disposition: Go to ED or PCP/Alternative with Approval  Patient/caregiver understands and will follow disposition?: Yes  Copied from CRM [id number]. Topic: Clinical - Red Word Triage >> Feb 08, 2024  3:20 PM Fonda T wrote: Red Word that prompted transfer to Nurse Triage: Patient calling stating he may have suffered a heat stroke, with sweating, cold chills, and now having severe back pain. Reason for Disposition  [1] Dizziness or weakness AND [2] persists after 2 hours of rest and drinking liquids    Pt reporting severe R back pain & extreme chills after being outside yesterday. Pt advised to go to ED.  Answer Assessment - Initial Assessment Questions 1. SYMPTOMS: What symptoms are you concerned about?     Yest afternoon after coming back inside, sweating, cold chills 2. ONSET:  When did the symptoms start?     Out yest too long 3. FEVER: Do you have a fever? If Yes, ask: What is it, how was it measured, and when did it start?      Some  fevers 4. HEAT EXPOSURE: What caused you to become hot? (e.g., outside in the sun, inside a hot room)     Heidi was outside  5. PHYSICAL ACTIVITY:  What type of physical activity were you doing? (e.g., working, sports)     Just tying up plants 6. SICK: Have you been sick recently? (e.g., cold, flu, recent fever)     Getting over a cold, hacking cough - previously sick 7. OTHER SYMPTOMS: Do you have any other symptoms? (e.g., fainting, flushed skin, weakness, nausea, vomiting, muscle cramps)     Chills, R back pain, cannot sleep, no n/v, no H/A, BM - some constipation, able to urinate,  Cannot lay down, has to sit straight up, dry cough  8/10  back pain - after getting chills  No chest pain/diff breathing  Protocols used: Heat Exposure (Heat Exhaustion and Heat Stroke)-A-AH

## 2024-02-08 NOTE — ED Notes (Signed)
 Urine sent to lab

## 2024-02-08 NOTE — ED Triage Notes (Signed)
 Pt to ED POV reporting a near syncopal episode yesterday while working out in the yard. Patient went inside and had chills persist throughout the day with right sided flank pain. Patient reporting today he continues to feel run down with persistent right flank pain.   Patient has had the flu for the past week with persistent cough.

## 2024-02-09 NOTE — Telephone Encounter (Signed)
 Spoke with pt. He went to the ED. He states that he is mostly feeling better.

## 2024-02-12 DIAGNOSIS — N132 Hydronephrosis with renal and ureteral calculous obstruction: Secondary | ICD-10-CM | POA: Diagnosis not present

## 2024-02-12 DIAGNOSIS — R1084 Generalized abdominal pain: Secondary | ICD-10-CM | POA: Diagnosis not present

## 2024-02-19 NOTE — Progress Notes (Unsigned)
 02/20/2024 John Evans [id number] Mar 04, 1941  Referring provider: Duanne Butler DASEN, MD Primary GI doctor: Dr. San (Dr. Aneita)  ASSESSMENT AND PLAN:  Constipation x 2 months Initially was having loose stools with fecal incontinence but then turned to constipation with BM every 3-4 days, AB cramping, used miralax, glycerine suppositories, dulcolax that helped  Very little gas during that time, no N/V, felt distended No AB surgeries Colonoscopy 05/2016 1 TA polyp, diverticulosis 02/08/2024 CTAB WC for abdominal pain showed obstructing 2 mm calculus with mild right hydronephrosis nonobstructing right nephrolithiasis, marked prostatic hypertrophy, moderate diverticulosis without inflammation, mild hepatic steatosis Now on 1 capful of miralax daily no blood in stools, no melena Likely ileus from hydrocodone  use and dehydration, has improved off medicine with mirlax and water, benign AB today - get KUB to evaluate for obstruction - continue miralax daily 1 capful daily, titrate every 2-3 days - Increase fiber/ water intake, decrease caffeine, increase activity level. - discussed considering colonoscopy since last colon was in 2017 to evaluate for masses/obstruction, recent normal CT and symptoms are improving so patient prefers to wait. Will call back for OV if any new symptoms or symptoms return, declines scheduling follow up at this time. Wife recently diagnosed with dementia, may also be contributing.   History of diverticulitis No symptoms at this time, has never had previously 02/08/2024 CT abdomen pelvis with contrast for abdominal pain showed obstructing 2 mm calculus with mild right hydronephrosis nonobstructing right nephrolithiasis, marked prostatic hypertrophy, moderate diverticulosis without inflammation, mild hepatic steatosis  Hepatic steatosis via CT 2025    Latest Ref Rng & Units 02/08/2024    7:13 PM 09/23/2023    2:02 PM 10/18/2022    8:50 AM  Hepatic Function   Total Protein 6.5 - 8.1 g/dL 6.9  6.5  6.6   Albumin 3.5 - 5.0 g/dL 3.9  3.9    AST 15 - 41 U/L 27  26  22    ALT 0 - 44 U/L 17  23  20    Alk Phosphatase 38 - 126 U/L 95  71    Total Bilirubin 0.0 - 1.2 mg/dL 0.7  1.1  0.8    Platelets 215  - Monitor CBC, LFTs every 6 months  Personal history of colon polyps Colonoscopy 05/2016 1 TA polyp, diverticulosis No recall due to age  Patient Care Team: Duanne Butler DASEN, MD as PCP - General (Family Medicine)  HISTORY OF PRESENT ILLNESS: 83 y.o. male with a past medical history listed below presents for evaluation of constipation.   Last seen in the office December 2019 by Dr. Aneita for left-sided abdominal pain presumed diverticulitis and treated with Augmentin .  Discussed the use of AI scribe software for clinical note transcription with the patient, who gave verbal consent to proceed.  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  Approximately two months ago, he began experiencing changes in his bowel habits. Initially, he had regular daily bowel movements, but then developed loose stools and episodes of incontinence, which lasted for about two to three weeks. This was followed by a period of constipation, where he went three to four days without a bowel movement, accompanied by stomach cramping and pain. He attempted various treatments for constipation, including Miralax, glycine suppositories, Fleet's enema, and Dulcolax, finding some relief with Dulcolax and glycine suppositories. Despite these efforts, he experienced hard, compacted, and black  stools, which were difficult to pass, and noted a lack of gas passage during this time.  He has a history of irritable bowel syndrome and recently experienced a kidney stone, for which he took hydrocodone  for two days. No abdominal surgeries, nausea, vomiting, or rectal bleeding, although he noted dark  stools. He has not experienced any significant weight loss, attributing minor weight changes to outdoor activities. He drinks Gatorade to stay hydrated.  His current medications include Miralax, which he takes daily at one capful. He also started colchicine  three weeks ago for gout, which has improved his symptoms. He previously took tamsulosin  but discontinued it due to side effects.  He is an outdoor person with a large garden and five acres of grass, which he maintains regularly. No family history of colon cancer.      Colonoscopy 05/2016 - Two 2 to 4 mm polyps in the ascending colon, removed piecemeal using a cold biopsy forceps. Resected and retrieved. (tubular adenoma and benign mucosa) - Diverticulosis in the left colon. - The examination was otherwise normal on direct and retroflexion views.  He  reports that he has never smoked. He has never used smokeless tobacco. He reports that he does not drink alcohol and does not use drugs.  RELEVANT GI HISTORY, IMAGING AND LABS: Results   LABS Uric acid: Normal Liver function tests: Normal Kidney function tests: Normal Lipase: Normal  RADIOLOGY Abdominal CT: No inflammation, no obstruction, fatty liver (02/08/2024)  DIAGNOSTIC REPORTS Colonoscopy: Small tubular adenoma (2017)      CBC    Component Value Date/Time   WBC 8.4 02/08/2024 1723   RBC 5.04 02/08/2024 1723   HGB 16.0 02/08/2024 1723   HCT 44.7 02/08/2024 1723   PLT 215 02/08/2024 1723   MCV 88.7 02/08/2024 1723   MCH 31.7 02/08/2024 1723   MCHC 35.8 02/08/2024 1723   RDW 12.4 02/08/2024 1723   LYMPHSABS 1.5 02/08/2024 1723   MONOABS 0.7 02/08/2024 1723   EOSABS 0.1 02/08/2024 1723   BASOSABS 0.0 02/08/2024 1723   Recent Labs    09/23/23 1402 09/23/23 1534 02/08/24 1723  HGB 16.7 15.6 16.0    CMP     Component Value Date/Time   NA 139 02/08/2024 1913   K 4.1 02/08/2024 1913   CL 103 02/08/2024 1913   CO2 26 02/08/2024 1913   GLUCOSE 97 02/08/2024  1913   BUN 14 02/08/2024 1913   CREATININE 1.10 02/08/2024 1913   CREATININE 0.94 10/18/2022 0850   CALCIUM  9.6 02/08/2024 1913   PROT 6.9 02/08/2024 1913   ALBUMIN 3.9 02/08/2024 1913   AST 27 02/08/2024 1913   ALT 17 02/08/2024 1913   ALKPHOS 95 02/08/2024 1913   BILITOT 0.7 02/08/2024 1913   GFRNONAA >60 02/08/2024 1913   GFRNONAA 82 11/06/2020 1155   GFRAA 94 11/06/2020 1155      Latest Ref Rng & Units 02/08/2024    7:13 PM 09/23/2023    2:02 PM 10/18/2022    8:50 AM  Hepatic Function  Total Protein 6.5 - 8.1 g/dL 6.9  6.5  6.6   Albumin 3.5 - 5.0 g/dL 3.9  3.9    AST 15 - 41 U/L 27  26  22    ALT 0 - 44 U/L 17  23  20    Alk Phosphatase 38 - 126 U/L 95  71    Total Bilirubin 0.0 - 1.2 mg/dL 0.7  1.1  0.8       Current Medications:  Current Outpatient Medications (Analgesics):    colchicine  0.6 MG tablet, Take 1 tablet (0.6 mg total) by mouth daily.   febuxostat  (ULORIC ) 40 MG tablet, Take 40 mg by mouth daily.   febuxostat  (ULORIC ) 40 MG tablet, Take 1 tablet (40 mg total) by mouth daily.   Current Outpatient Medications (Other):    ALPRAZolam  (XANAX ) 0.5 MG tablet, Take 1 tablet (0.5 mg total) by mouth 2 (two) times daily as needed for anxiety.   finasteride  (PROSCAR ) 5 MG tablet, Take 1 tablet (5 mg total) by mouth daily.   tamsulosin  (FLOMAX ) 0.4 MG CAPS capsule, Take 1 capsule (0.4 mg total) by mouth daily.  Medical History:  Past Medical History:  Diagnosis Date   BPH (benign prostatic hyperplasia)    Gout    Hyperlipidemia    Patient reportedly had a coronary calcium  score of 21 last year I cannot find his report   Hypertension    Insomnia    Obstructive sleep apnea    Panic attacks    Allergies:  Allergies  Allergen Reactions   Alpha-Gal Diarrhea and Nausea Only   Latex Rash     Surgical History:  He  has a past surgical history that includes Eye surgery and Back surgery. Family History:  His family history includes Cancer in his father;  Diabetes in his paternal grandmother; Esophageal cancer in his father.  REVIEW OF SYSTEMS  : All other systems reviewed and negative except where noted in the History of Present Illness.  PHYSICAL EXAM: BP 118/68   Pulse 95   Ht 5' 11 (1.803 m)   Wt 177 lb (80.3 kg)   BMI 24.69 kg/m  Physical Exam   GENERAL APPEARANCE: Well nourished, in no apparent distress. HEENT: No cervical lymphadenopathy, unremarkable thyroid , sclerae anicteric, conjunctiva pink. RESPIRATORY: Respiratory effort normal, breath sounds equal bilaterally without rales, rhonchi, or wheezing. CARDIO: Regular rate and rhythm with no murmurs, rubs, or gallops, peripheral pulses intact. ABDOMEN: Soft, non-distended, active bowel sounds in all four quadrants, no tenderness to palpation, no rebound, no mass appreciated, abdomen normal. RECTAL: Declines. MUSCULOSKELETAL: Full range of motion, normal gait, without edema. SKIN: Dry, intact without rashes or lesions. No jaundice. NEURO: Alert, oriented, no focal deficits. PSYCH: Cooperative, normal mood and affect.      Alan JONELLE Coombs, PA-C 1:51 PM

## 2024-02-20 ENCOUNTER — Ambulatory Visit: Admitting: Physician Assistant

## 2024-02-20 ENCOUNTER — Ambulatory Visit (INDEPENDENT_AMBULATORY_CARE_PROVIDER_SITE_OTHER)
Admission: RE | Admit: 2024-02-20 | Discharge: 2024-02-20 | Disposition: A | Discharge status: Home / Self Care | Source: Ambulatory Visit | Attending: Physician Assistant | Admitting: Physician Assistant

## 2024-02-20 ENCOUNTER — Encounter: Payer: Self-pay | Admitting: Physician Assistant

## 2024-02-20 VITALS — BP 118/68 | HR 95 | Ht 71.0 in | Wt 177.0 lb

## 2024-02-20 DIAGNOSIS — Z860101 Personal history of adenomatous and serrated colon polyps: Secondary | ICD-10-CM

## 2024-02-20 DIAGNOSIS — K5904 Chronic idiopathic constipation: Secondary | ICD-10-CM

## 2024-02-20 DIAGNOSIS — K760 Fatty (change of) liver, not elsewhere classified: Secondary | ICD-10-CM

## 2024-02-20 DIAGNOSIS — Z8719 Personal history of other diseases of the digestive system: Secondary | ICD-10-CM | POA: Diagnosis not present

## 2024-02-20 DIAGNOSIS — I878 Other specified disorders of veins: Secondary | ICD-10-CM | POA: Diagnosis not present

## 2024-02-20 DIAGNOSIS — N132 Hydronephrosis with renal and ureteral calculous obstruction: Secondary | ICD-10-CM

## 2024-02-20 DIAGNOSIS — K5790 Diverticulosis of intestine, part unspecified, without perforation or abscess without bleeding: Secondary | ICD-10-CM

## 2024-02-20 DIAGNOSIS — N200 Calculus of kidney: Secondary | ICD-10-CM | POA: Diagnosis not present

## 2024-02-20 DIAGNOSIS — K5900 Constipation, unspecified: Secondary | ICD-10-CM

## 2024-02-20 NOTE — Patient Instructions (Addendum)
 Your provider has requested that you have an abdominal x ray before leaving today. Please go to the basement floor to our Radiology department for the test.  Miralax is an osmotic laxative.  It only brings more water into the stool.  This is safe to take daily.  Can take up to 17 gram of miralax twice a day.  Mix with juice or coffee.  Start 1 capful a day for 3-4 days and reassess your response in 3-4 days.  You can increase and decrease the dose based on your response.  Remember, it can take up to 3-4 days to take effect OR for the effects to wear off.   Consider colonoscopy if not improving or any other symptoms like blood in the stools, abdominal pain, etc.    Toileting tips to help with your constipation - Drink at least 64-80 ounces of water/liquid per day. - Establish a time to try to move your bowels every day.  For many people, this is after a cup of coffee or after a meal such as breakfast. - Sit all of the way back on the toilet keeping your back fairly straight and while sitting up, try to rest the tops of your forearms on your upper thighs.   - Raising your feet with a step stool/squatty potty can be helpful to improve the angle that allows your stool to pass through the rectum. - Relax the rectum feeling it bulge toward the toilet water.  If you feel your rectum raising toward your body, you are contracting rather than relaxing. - Breathe in and slowly exhale. Belly breath by expanding your belly towards your belly button. Keep belly expanded as you gently direct pressure down and back to the anus.  A low pitched GRRR sound can assist with increasing intra-abdominal pressure.  (Can also trying to blow on a pinwheel and make it move, this helps with the same belly breathing) - Repeat 3-4 times. If unsuccessful, contract the pelvic floor to restore normal tone and get off the toilet.  Avoid excessive straining. - To reduce excessive wiping by teaching your anus to normally  contract, place hands on outer aspect of knees and resist knee movement outward.  Hold 5-10 second then place hands just inside of knees and resist inward movement of knees.  Hold 5 seconds.  Repeat a few times each way.  Go to the ER if unable to pass gas, severe AB pain, unable to hold down food, any shortness of breath of chest pain.  Thank you for trusting me with your gastrointestinal care!   Alan Coombs, PA-C   _______________________________________________________  If your blood pressure at your visit was 140/90 or greater, please contact your primary care physician to follow up on this.  _______________________________________________________  If you are age 77 or older, your body mass index should be between 23-30. Your Body mass index is 24.69 kg/m. If this is out of the aforementioned range listed, please consider follow up with your Primary Care Provider.  If you are age 42 or younger, your body mass index should be between 19-25. Your Body mass index is 24.69 kg/m. If this is out of the aformentioned range listed, please consider follow up with your Primary Care Provider.   ________________________________________________________  The [facility] providers would like to encourage you to use MYCHART to communicate with providers for non-urgent requests or questions.  Due to long hold times on the telephone, sending your provider a message by [facility] may be a  faster and more efficient way to get a response.  Please allow 48 business hours for a response.  Please remember that this is for non-urgent requests.  _______________________________________________________   Diverticulosis Diverticulosis is a condition that develops when small pouches (diverticula) form in the wall of the large intestine (colon). The colon is where water is absorbed and stool (feces) is formed. The pouches form when the inside layer of the colon pushes through weak spots in the outer layers of the  colon. You may have a few pouches or many of them. The pouches usually do not cause problems unless they become inflamed or infected. When this happens, the condition is called diverticulitis- this is left lower quadrant pain, diarrhea, fever, chills, nausea or vomiting.  If this occurs please call the office or go to the hospital. Sometimes these patches without inflammation can also have painless bleeding associated with them, if this happens please call the office or go to the hospital. Preventing constipation and increasing fiber can help reduce diverticula and prevent complications. Even if you feel you have a high-fiber diet, suggest getting on Benefiber or Cirtracel 2 times daily.

## 2024-02-21 DIAGNOSIS — M7661 Achilles tendinitis, right leg: Secondary | ICD-10-CM | POA: Diagnosis not present

## 2024-02-22 ENCOUNTER — Ambulatory Visit: Payer: Self-pay | Admitting: Physician Assistant

## 2024-02-27 ENCOUNTER — Other Ambulatory Visit (HOSPITAL_COMMUNITY): Payer: Self-pay

## 2024-02-27 DIAGNOSIS — L821 Other seborrheic keratosis: Secondary | ICD-10-CM | POA: Diagnosis not present

## 2024-02-27 DIAGNOSIS — Z85828 Personal history of other malignant neoplasm of skin: Secondary | ICD-10-CM | POA: Diagnosis not present

## 2024-02-27 DIAGNOSIS — D225 Melanocytic nevi of trunk: Secondary | ICD-10-CM | POA: Diagnosis not present

## 2024-02-27 DIAGNOSIS — D2271 Melanocytic nevi of right lower limb, including hip: Secondary | ICD-10-CM | POA: Diagnosis not present

## 2024-02-27 DIAGNOSIS — L812 Freckles: Secondary | ICD-10-CM | POA: Diagnosis not present

## 2024-02-27 DIAGNOSIS — D1801 Hemangioma of skin and subcutaneous tissue: Secondary | ICD-10-CM | POA: Diagnosis not present

## 2024-02-27 DIAGNOSIS — L570 Actinic keratosis: Secondary | ICD-10-CM | POA: Diagnosis not present

## 2024-02-27 MED ORDER — FLUOROURACIL 5 % EX CREA
TOPICAL_CREAM | Freq: Every day | CUTANEOUS | 0 refills | Status: AC
  Filled 2024-02-27: qty 40, 30d supply, fill #0

## 2024-02-28 DIAGNOSIS — M7661 Achilles tendinitis, right leg: Secondary | ICD-10-CM | POA: Diagnosis not present

## 2024-02-28 NOTE — Progress Notes (Signed)
 Agree with the assessment and plan as outlined by Quentin Mulling, PA-C. ? ?Keron Neenan, DO, FACG ? ?

## 2024-03-06 DIAGNOSIS — N210 Calculus in bladder: Secondary | ICD-10-CM | POA: Diagnosis not present

## 2024-03-06 DIAGNOSIS — N201 Calculus of ureter: Secondary | ICD-10-CM | POA: Diagnosis not present

## 2024-03-19 DIAGNOSIS — L602 Onychogryphosis: Secondary | ICD-10-CM | POA: Diagnosis not present

## 2024-03-19 DIAGNOSIS — M7662 Achilles tendinitis, left leg: Secondary | ICD-10-CM | POA: Diagnosis not present

## 2024-03-19 DIAGNOSIS — M65872 Other synovitis and tenosynovitis, left ankle and foot: Secondary | ICD-10-CM | POA: Diagnosis not present

## 2024-03-19 DIAGNOSIS — I739 Peripheral vascular disease, unspecified: Secondary | ICD-10-CM | POA: Diagnosis not present

## 2024-03-19 DIAGNOSIS — M7732 Calcaneal spur, left foot: Secondary | ICD-10-CM | POA: Diagnosis not present

## 2024-03-19 DIAGNOSIS — M7731 Calcaneal spur, right foot: Secondary | ICD-10-CM | POA: Diagnosis not present

## 2024-03-26 DIAGNOSIS — M216X1 Other acquired deformities of right foot: Secondary | ICD-10-CM | POA: Diagnosis not present

## 2024-03-26 DIAGNOSIS — R269 Unspecified abnormalities of gait and mobility: Secondary | ICD-10-CM | POA: Diagnosis not present

## 2024-03-26 DIAGNOSIS — M7731 Calcaneal spur, right foot: Secondary | ICD-10-CM | POA: Diagnosis not present

## 2024-03-27 ENCOUNTER — Other Ambulatory Visit (HOSPITAL_COMMUNITY): Payer: Self-pay

## 2024-03-27 ENCOUNTER — Other Ambulatory Visit: Payer: Self-pay

## 2024-04-11 ENCOUNTER — Other Ambulatory Visit (HOSPITAL_COMMUNITY): Payer: Self-pay

## 2024-04-26 DIAGNOSIS — H2511 Age-related nuclear cataract, right eye: Secondary | ICD-10-CM | POA: Diagnosis not present

## 2024-04-26 DIAGNOSIS — H401131 Primary open-angle glaucoma, bilateral, mild stage: Secondary | ICD-10-CM | POA: Diagnosis not present

## 2024-04-26 DIAGNOSIS — Z961 Presence of intraocular lens: Secondary | ICD-10-CM | POA: Diagnosis not present

## 2024-04-29 ENCOUNTER — Other Ambulatory Visit (HOSPITAL_COMMUNITY): Payer: Self-pay

## 2024-04-30 ENCOUNTER — Other Ambulatory Visit: Payer: Self-pay

## 2024-05-08 DIAGNOSIS — D1801 Hemangioma of skin and subcutaneous tissue: Secondary | ICD-10-CM | POA: Diagnosis not present

## 2024-05-08 DIAGNOSIS — Z85828 Personal history of other malignant neoplasm of skin: Secondary | ICD-10-CM | POA: Diagnosis not present

## 2024-05-08 DIAGNOSIS — L570 Actinic keratosis: Secondary | ICD-10-CM | POA: Diagnosis not present

## 2024-05-08 DIAGNOSIS — L812 Freckles: Secondary | ICD-10-CM | POA: Diagnosis not present

## 2024-05-08 DIAGNOSIS — L821 Other seborrheic keratosis: Secondary | ICD-10-CM | POA: Diagnosis not present

## 2024-05-09 ENCOUNTER — Other Ambulatory Visit (HOSPITAL_COMMUNITY): Payer: Self-pay

## 2024-05-09 DIAGNOSIS — N200 Calculus of kidney: Secondary | ICD-10-CM | POA: Diagnosis not present

## 2024-05-09 MED ORDER — FINASTERIDE 5 MG PO TABS
5.0000 mg | ORAL_TABLET | Freq: Every day | ORAL | 3 refills | Status: AC
  Filled 2024-06-18: qty 90, 90d supply, fill #0
  Filled 2024-09-16: qty 90, 90d supply, fill #1

## 2024-05-16 ENCOUNTER — Other Ambulatory Visit (HOSPITAL_COMMUNITY): Payer: Self-pay

## 2024-05-16 ENCOUNTER — Ambulatory Visit (INDEPENDENT_AMBULATORY_CARE_PROVIDER_SITE_OTHER): Admitting: Family Medicine

## 2024-05-16 ENCOUNTER — Encounter: Payer: Self-pay | Admitting: Family Medicine

## 2024-05-16 VITALS — BP 138/82 | HR 71 | Temp 97.8°F | Ht 71.0 in | Wt 182.8 lb

## 2024-05-16 DIAGNOSIS — R634 Abnormal weight loss: Secondary | ICD-10-CM

## 2024-05-16 DIAGNOSIS — G4700 Insomnia, unspecified: Secondary | ICD-10-CM | POA: Diagnosis not present

## 2024-05-16 DIAGNOSIS — R399 Unspecified symptoms and signs involving the genitourinary system: Secondary | ICD-10-CM

## 2024-05-16 DIAGNOSIS — N429 Disorder of prostate, unspecified: Secondary | ICD-10-CM | POA: Diagnosis not present

## 2024-05-16 DIAGNOSIS — M109 Gout, unspecified: Secondary | ICD-10-CM | POA: Insufficient documentation

## 2024-05-16 DIAGNOSIS — R300 Dysuria: Secondary | ICD-10-CM | POA: Diagnosis not present

## 2024-05-16 DIAGNOSIS — R269 Unspecified abnormalities of gait and mobility: Secondary | ICD-10-CM | POA: Insufficient documentation

## 2024-05-16 MED ORDER — CLONAZEPAM 0.5 MG PO TABS
0.5000 mg | ORAL_TABLET | Freq: Every day | ORAL | 1 refills | Status: DC
  Filled 2024-05-16 – 2024-05-17 (×2): qty 30, 30d supply, fill #0
  Filled 2024-06-14: qty 30, 30d supply, fill #1

## 2024-05-16 NOTE — Progress Notes (Signed)
 Wt Readings from Last 3 Encounters:  05/16/24 182 lb 12.8 oz (82.9 kg)  02/20/24 177 lb (80.3 kg)  01/22/24 184 lb (83.5 kg)     Subjective:    Patient ID: John Evans, male    DOB: [birth year], 83 y.o.   MRN: [id number] Patient is under a lot of stress.  His wife is battling dementia.  She often deals with sundowning.  Patient states at night, he gets extremely frustrated at times due to having to constantly repeat himself and deal with her disorientation.  He then feels extremely guilty when he loses his temper and says something mean.  He denies depression.  However he is tired and worn out helping to manage her dementia single-handedly.  He is not sleeping well.  Patient states that he will often go to sleep, wake up and then be unable to fall asleep for hours.  Because he is not resting well, he feels tired the next day.  He has lost about 12 pounds since last year at this time.  Some of this could be due to stress but he is also concerned about prostate cancer.  He reports some brown discharge when he ejaculates.  He denies any blood.  He does have some mild pain in both hips and his lower back.  He is concerned that this could be a sign of prostate cancer. Past Medical History:  Diagnosis Date   BPH (benign prostatic hyperplasia)    Gout    Hyperlipidemia    Patient reportedly had a coronary calcium  score of 21 last year I cannot find his report   Hypertension    Insomnia    Obstructive sleep apnea    Panic attacks    Past Surgical History:  Procedure Laterality Date   BACK SURGERY     EYE SURGERY     Current Outpatient Medications on File Prior to Visit  Medication Sig Dispense Refill   ALPRAZolam  (XANAX ) 0.5 MG tablet Take 1 tablet (0.5 mg total) by mouth 2 (two) times daily as needed for anxiety. 30 tablet 0   colchicine  0.6 MG tablet Take 1 tablet (0.6 mg total) by mouth daily. 30 tablet 5   febuxostat  (ULORIC ) 40 MG tablet Take 40 mg by mouth daily.     febuxostat   (ULORIC ) 40 MG tablet Take 1 tablet (40 mg total) by mouth daily. 90 tablet 3   finasteride  (PROSCAR ) 5 MG tablet Take 1 tablet (5 mg total) by mouth daily. 90 tablet 3   fluorouracil  (EFUDEX ) 5 % cream Apply a small amount once a day, for precancers on face for 10 days. 40 g 0   tamsulosin  (FLOMAX ) 0.4 MG CAPS capsule Take 1 capsule (0.4 mg total) by mouth daily. 30 capsule 0   No current facility-administered medications on file prior to visit.   Allergies  Allergen Reactions   Alpha-Gal Diarrhea and Nausea Only   Latex Rash   Social History   Socioeconomic History   Marital status: Married    Spouse name: Candis   Number of children: 2   Years of education: Not on file   Highest education level: Not on file  Occupational History   Occupation: 2  Tobacco Use   Smoking status: Never   Smokeless tobacco: Never  Vaping Use   Vaping status: Not on file  Substance and Sexual Activity   Alcohol use: No   Drug use: No   Sexual activity: Yes    Comment: married, retired Teacher, early years/pre  Other Topics Concern   Not on file  Social History Narrative   Married x 55 years.    4 grandchildren   Social Drivers of Corporate investment banker Strain: Low Risk  (10/31/2023)   Received from [facility]   Overall Financial Resource Strain (CARDIA)    Difficulty of Paying Living Expenses: Not hard at all  Food Insecurity: No Food Insecurity (10/31/2023)   Received from [facility]   Hunger Vital Sign    Within the past 12 months, you worried that your food would run out before you got the money to buy more.: Never true    Within the past 12 months, the food you bought just didn't last and you didn't have money to get more.: Never true  Transportation Needs: No Transportation Needs (10/31/2023)   Received from [facility] - Transportation    In the past 12 months, has lack of transportation kept you from medical appointments or  from getting medications?: No    Lack of Transportation (Non-Medical): No  Physical Activity: Inactive (08/26/2021)   Exercise Vital Sign    Days of Exercise per Week: 0 days    Minutes of Exercise per Session: 0 min  Stress: No Stress Concern Present (08/26/2021)   [facility] [employer] - Occupational Stress Questionnaire    Feeling of Stress : Not at all  Social Connections: Socially Integrated (08/26/2021)   Social Connection and Isolation Panel    Frequency of Communication with Friends and Family: More than three times a week    Frequency of Social Gatherings with Friends and Family: More than three times a week    Attends Religious Services: More than 4 times per year    Active Member of [employer] or Organizations: Yes    Attends Engineer, structural: More than 4 times per year    Marital Status: Married  Catering manager Violence: Not At Risk (08/26/2021)   Humiliation, Afraid, Rape, and Kick questionnaire    Fear of Current or Ex-Partner: No    Emotionally Abused: No    Physically Abused: No    Sexually Abused: No       Review of Systems     Objective:   Physical Exam Constitutional:      General: He is not in acute distress.    Appearance: Normal appearance. He is not ill-appearing or toxic-appearing.  Cardiovascular:     Rate and Rhythm: Normal rate.     Heart sounds: Normal heart sounds. No murmur heard. Pulmonary:     Effort: Pulmonary effort is normal.     Breath sounds: Normal breath sounds.  Genitourinary:    Prostate: Enlarged. Not tender and no nodules present.     Rectum: Normal.  Musculoskeletal:     Right foot: Swelling present.  Neurological:     Mental Status: He is alert.           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  Patient wants to avoid anticholinergic medication so we will try him on Klonopin 0.5 mg p.o. nightly.  He will discontinue Xanax  and only use the Klonopin.  Because of the weight loss I will check a  CBC CMP TSH as well as a PSA.  However his prostate exam today showed no abnormality other than enlargement

## 2024-05-17 ENCOUNTER — Other Ambulatory Visit: Payer: Self-pay

## 2024-05-17 ENCOUNTER — Other Ambulatory Visit (HOSPITAL_COMMUNITY): Payer: Self-pay

## 2024-05-17 ENCOUNTER — Ambulatory Visit: Payer: Self-pay | Admitting: Family Medicine

## 2024-05-17 LAB — CBC WITH DIFFERENTIAL/PLATELET
Absolute Lymphocytes: 1656 {cells}/uL (ref 850–3900)
Absolute Monocytes: 677 {cells}/uL (ref 200–950)
Basophils Absolute: 58 {cells}/uL (ref 0–200)
Basophils Relative: 0.8 %
Eosinophils Absolute: 101 {cells}/uL (ref 15–500)
Eosinophils Relative: 1.4 %
HCT: 47.6 % (ref 38.5–50.0)
Hemoglobin: 16.9 g/dL (ref 13.2–17.1)
MCH: 32.1 pg (ref 27.0–33.0)
MCHC: 35.5 g/dL (ref 32.0–36.0)
MCV: 90.3 fL (ref 80.0–100.0)
MPV: 10.2 fL (ref 7.5–12.5)
Monocytes Relative: 9.4 %
Neutro Abs: 4709 {cells}/uL (ref 1500–7800)
Neutrophils Relative %: 65.4 %
Platelets: 198 Thousand/uL (ref 140–400)
RBC: 5.27 Million/uL (ref 4.20–5.80)
RDW: 12.7 % (ref 11.0–15.0)
Total Lymphocyte: 23 %
WBC: 7.2 Thousand/uL (ref 3.8–10.8)

## 2024-05-17 LAB — COMPREHENSIVE METABOLIC PANEL WITH GFR
AG Ratio: 1.8 (calc) (ref 1.0–2.5)
ALT: 22 U/L (ref 9–46)
AST: 22 U/L (ref 10–35)
Albumin: 4.2 g/dL (ref 3.6–5.1)
Alkaline phosphatase (APISO): 93 U/L (ref 35–144)
BUN: 15 mg/dL (ref 7–25)
CO2: 31 mmol/L (ref 20–32)
Calcium: 9.5 mg/dL (ref 8.6–10.3)
Chloride: 102 mmol/L (ref 98–110)
Creat: 0.82 mg/dL (ref 0.70–1.22)
Globulin: 2.3 g/dL (ref 1.9–3.7)
Glucose, Bld: 101 mg/dL — ABNORMAL HIGH (ref 65–99)
Potassium: 4.4 mmol/L (ref 3.5–5.3)
Sodium: 141 mmol/L (ref 135–146)
Total Bilirubin: 0.7 mg/dL (ref 0.2–1.2)
Total Protein: 6.5 g/dL (ref 6.1–8.1)
eGFR: 87 mL/min/1.73m2 (ref >=60)

## 2024-05-17 LAB — PSA: PSA: 0.86 ng/mL (ref <=4.00)

## 2024-05-17 LAB — TSH: TSH: 3.72 m[IU]/L (ref 0.40–4.50)

## 2024-05-22 ENCOUNTER — Other Ambulatory Visit (HOSPITAL_BASED_OUTPATIENT_CLINIC_OR_DEPARTMENT_OTHER): Payer: Self-pay

## 2024-05-22 MED ORDER — FLUZONE HIGH-DOSE 0.5 ML IM SUSY
0.5000 mL | PREFILLED_SYRINGE | Freq: Once | INTRAMUSCULAR | 0 refills | Status: AC
Start: 2024-05-22 — End: 2024-05-23
  Filled 2024-05-22: qty 0.5, 1d supply, fill #0

## 2024-06-14 ENCOUNTER — Other Ambulatory Visit (HOSPITAL_COMMUNITY): Payer: Self-pay

## 2024-06-17 ENCOUNTER — Encounter: Payer: Self-pay | Admitting: Radiology

## 2024-06-18 ENCOUNTER — Other Ambulatory Visit: Payer: Self-pay

## 2024-06-18 ENCOUNTER — Other Ambulatory Visit (HOSPITAL_COMMUNITY): Payer: Self-pay

## 2024-07-13 ENCOUNTER — Other Ambulatory Visit: Payer: Self-pay | Admitting: Family Medicine

## 2024-07-18 ENCOUNTER — Ambulatory Visit: Admitting: Family Medicine

## 2024-07-18 ENCOUNTER — Other Ambulatory Visit (HOSPITAL_COMMUNITY): Payer: Self-pay

## 2024-07-18 ENCOUNTER — Encounter: Payer: Self-pay | Admitting: Family Medicine

## 2024-07-18 VITALS — BP 130/72 | HR 86 | Temp 97.7°F | Ht 71.0 in | Wt 186.6 lb

## 2024-07-18 DIAGNOSIS — I10 Essential (primary) hypertension: Secondary | ICD-10-CM

## 2024-07-18 DIAGNOSIS — F439 Reaction to severe stress, unspecified: Secondary | ICD-10-CM

## 2024-07-18 DIAGNOSIS — M1A9XX0 Chronic gout, unspecified, without tophus (tophi): Secondary | ICD-10-CM

## 2024-07-18 DIAGNOSIS — Z91014 Allergy to mammalian meats: Secondary | ICD-10-CM

## 2024-07-18 DIAGNOSIS — Z0000 Encounter for general adult medical examination without abnormal findings: Secondary | ICD-10-CM

## 2024-07-18 MED ORDER — ESCITALOPRAM OXALATE 10 MG PO TABS
10.0000 mg | ORAL_TABLET | Freq: Every day | ORAL | 5 refills | Status: AC
  Filled 2024-07-18 (×2): qty 30, 30d supply, fill #0
  Filled 2024-08-12: qty 30, 30d supply, fill #1
  Filled 2024-09-11: qty 30, 30d supply, fill #2

## 2024-07-18 MED ORDER — CLONAZEPAM 1 MG PO TABS
1.0000 mg | ORAL_TABLET | Freq: Every day | ORAL | 2 refills | Status: AC
  Filled 2024-07-18 (×2): qty 30, 30d supply, fill #0

## 2024-07-18 NOTE — Progress Notes (Signed)
 Subjective:    Patient ID: John Evans, male    DOB: [birth year], 83 y.o.   MRN: [id number]  Patient is an 83 year old Caucasian gentleman here today for complete physical exam.  Labs 05/16/24 were excellent: No visits with results within 2 Month(s) from this visit.  Latest known visit with results is:  Office Visit on 05/16/2024  Component Date Value Ref Range Status   PSA 05/16/2024 0.86  < OR = 4.00 ng/mL Final   Comment: The total PSA value from this assay system is  standardized against the WHO standard. The test  result will be approximately 20% lower when compared  to the equimolar-standardized total PSA (Beckman  Coulter). Comparison of serial PSA results should be  interpreted with this fact in mind. . This test was performed using the Siemens  chemiluminescent method. Values obtained from  different assay methods cannot be used interchangeably. PSA levels, regardless of value, should not be interpreted as absolute evidence of the presence or absence of disease.    WBC 05/16/2024 7.2  3.8 - 10.8 Thousand/uL Final   RBC 05/16/2024 5.27  4.20 - 5.80 Million/uL Final   Hemoglobin 05/16/2024 16.9  13.2 - 17.1 g/dL Final   HCT 89/97/7974 47.6  38.5 - 50.0 % Final   MCV 05/16/2024 90.3  80.0 - 100.0 fL Final   MCH 05/16/2024 32.1  27.0 - 33.0 pg Final   MCHC 05/16/2024 35.5  32.0 - 36.0 g/dL Final   Comment: For adults, a slight decrease in the calculated MCHC value (in the range of 30 to 32 g/dL) is most likely not clinically significant; however, it should be interpreted with caution in correlation with other red cell parameters and the patient's clinical condition.    RDW 05/16/2024 12.7  11.0 - 15.0 % Final   Platelets 05/16/2024 198  140 - 400 Thousand/uL Final   MPV 05/16/2024 10.2  7.5 - 12.5 fL Final   Neutro Abs 05/16/2024 4,709  1,500 - 7,800 cells/uL Final   Absolute Lymphocytes 05/16/2024 1,656  850 - 3,900 cells/uL Final   Absolute Monocytes 05/16/2024  677  200 - 950 cells/uL Final   Eosinophils Absolute 05/16/2024 101  15 - 500 cells/uL Final   Basophils Absolute 05/16/2024 58  0 - 200 cells/uL Final   Neutrophils Relative % 05/16/2024 65.4  % Final   Total Lymphocyte 05/16/2024 23.0  % Final   Monocytes Relative 05/16/2024 9.4  % Final   Eosinophils Relative 05/16/2024 1.4  % Final   Basophils Relative 05/16/2024 0.8  % Final   Glucose, Bld 05/16/2024 101 (H)  65 - 99 mg/dL Final   Comment: .            Fasting reference interval . For someone without known diabetes, a glucose value between 100 and 125 mg/dL is consistent with prediabetes and should be confirmed with a follow-up test. .    BUN 05/16/2024 15  7 - 25 mg/dL Final   Creat 89/97/7974 0.82  0.70 - 1.22 mg/dL Final   eGFR 89/97/7974 87  > OR = 60 mL/min/1.55m2 Final   BUN/Creatinine Ratio 05/16/2024 SEE NOTE:  6 - 22 (calc) Final   Comment:    Not Reported: BUN and Creatinine are within    reference range. .    Sodium 05/16/2024 141  135 - 146 mmol/L Final   Potassium 05/16/2024 4.4  3.5 - 5.3 mmol/L Final   Chloride 05/16/2024 102  98 - 110 mmol/L Final  CO2 05/16/2024 31  20 - 32 mmol/L Final   Calcium  05/16/2024 9.5  8.6 - 10.3 mg/dL Final   Total Protein 89/97/7974 6.5  6.1 - 8.1 g/dL Final   Albumin 89/97/7974 4.2  3.6 - 5.1 g/dL Final   Globulin 89/97/7974 2.3  1.9 - 3.7 g/dL (calc) Final   AG Ratio 05/16/2024 1.8  1.0 - 2.5 (calc) Final   Total Bilirubin 05/16/2024 0.7  0.2 - 1.2 mg/dL Final   Alkaline phosphatase (APISO) 05/16/2024 93  35 - 144 U/L Final   AST 05/16/2024 22  10 - 35 U/L Final   ALT 05/16/2024 22  9 - 46 U/L Final   TSH 05/16/2024 3.72  0.40 - 4.50 mIU/L Final   Patient continues to be under tremendous stress caring for his wife who has end-stage dementia.  He states he is not sleeping well at night.  Half a milligram of Klonopin  does not allow him to sleep.  He takes 1 mg of Klonopin  he is able to sleep for for 5 hours.  We discussed  this today.  Patient is a fall risk due to his age however I believe the benefit of the Klonopin  outweighs the risk so we have decided to increase the dose to 1 mg at night.  He also reports low energy, low appetite, trouble sleeping due to racing thoughts and worry.  He also endorses sadness.  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  We discussed at length whether he felt like he would benefit from Lexapro.  He states that he is willing to try it.  He also reports fecal leakage almost on a daily basis.  He reports a loose stool 1-2 times a day.  He denies any abdominal pain or melena or hematochezia Past Medical History:  Diagnosis Date   BPH (benign prostatic hyperplasia)    Gout    Hyperlipidemia    Patient reportedly had a coronary calcium  score of 21 last year I cannot find his report   Hypertension    Insomnia    Obstructive sleep apnea    Panic attacks    Past Surgical History:  Procedure Laterality Date   BACK SURGERY     EYE SURGERY     Current Outpatient Medications on File Prior to Visit  Medication Sig Dispense Refill   clonazePAM  (KLONOPIN ) 0.5 MG tablet Take 1 tablet (0.5 mg total) by mouth at bedtime. 30 tablet 1   colchicine  0.6 MG tablet TAKE 1 TABLET BY MOUTH EVERY DAY 30 tablet 5   febuxostat  (ULORIC ) 40 MG tablet Take 40 mg by mouth daily.     febuxostat  (ULORIC ) 40 MG tablet Take 1 tablet (40 mg total) by mouth daily. 90 tablet 3   finasteride  (PROSCAR ) 5 MG tablet Take 1 tablet (5 mg total) by mouth daily. 90 tablet 3   fluorouracil  (EFUDEX ) 5 % cream Apply a small amount once a day, for precancers on face for 10 days. 40 g 0   tamsulosin  (FLOMAX ) 0.4 MG CAPS capsule Take 1 capsule (0.4 mg total) by mouth daily. 30 capsule 0   No current facility-administered medications on file prior to visit.   Allergies  Allergen Reactions   Alpha-Gal Diarrhea and Nausea Only   Latex Rash   Social History    Socioeconomic History   Marital status: Married    Spouse name: Candis   Number of children: 2   Years of education: Not  on file   Highest education level: Not on file  Occupational History   Occupation: 2  Tobacco Use   Smoking status: Never   Smokeless tobacco: Never  Vaping Use   Vaping status: Not on file  Substance and Sexual Activity   Alcohol use: No   Drug use: No   Sexual activity: Yes    Comment: married, retired teacher, early years/pre  Other Topics Concern   Not on file  Social History Narrative   Married x 55 years.    4 grandchildren   Social Drivers of Corporate Investment Banker Strain: Low Risk  (10/31/2023)   Received from [facility]   Overall Financial Resource Strain (CARDIA)    Difficulty of Paying Living Expenses: Not hard at all  Food Insecurity: No Food Insecurity (10/31/2023)   Received from [facility]   Hunger Vital Sign    Within the past 12 months, you worried that your food would run out before you got the money to buy more.: Never true    Within the past 12 months, the food you bought just didn't last and you didn't have money to get more.: Never true  Transportation Needs: No Transportation Needs (10/31/2023)   Received from [facility] - Transportation    In the past 12 months, has lack of transportation kept you from medical appointments or from getting medications?: No    Lack of Transportation (Non-Medical): No  Physical Activity: Inactive (08/26/2021)   Exercise Vital Sign    Days of Exercise per Week: 0 days    Minutes of Exercise per Session: 0 min  Stress: No Stress Concern Present (08/26/2021)   [employer] of Occupational Health - Occupational Stress Questionnaire    Feeling of Stress : Not at all  Social Connections: Socially Integrated (08/26/2021)   Social Connection and Isolation Panel    Frequency of Communication with Friends and Family: More than three times a week     Frequency of Social Gatherings with Friends and Family: More than three times a week    Attends Religious Services: More than 4 times per year    Active Member of [employer] or Organizations: Yes    Attends Banker Meetings: More than 4 times per year    Marital Status: Married  Catering Manager Violence: Not At Risk (08/26/2021)   Humiliation, Afraid, Rape, and Kick questionnaire    Fear of Current or Ex-Partner: No    Emotionally Abused: No    Physically Abused: No    Sexually Abused: No     Review of Systems  All other systems reviewed and are negative.      Objective:   Physical Exam Vitals reviewed.  Constitutional:      General: He is not in acute distress.    Appearance: Normal appearance. He is well-developed and normal weight. He is not ill-appearing or toxic-appearing.  HENT:     Head: Normocephalic and atraumatic.     Right Ear: Tympanic membrane and ear canal normal.     Left Ear: Tympanic membrane and ear canal normal.     Nose: No congestion or rhinorrhea.     Mouth/Throat:     Mouth: Mucous membranes are moist.     Pharynx: No oropharyngeal exudate or posterior oropharyngeal erythema.  Eyes:     Extraocular Movements: Extraocular movements intact.     Conjunctiva/sclera: Conjunctivae normal.     Pupils: Pupils are equal, round, and  reactive to light.  Neck:     Vascular: No carotid bruit.  Cardiovascular:     Rate and Rhythm: Normal rate and regular rhythm.     Heart sounds: Normal heart sounds. No murmur heard.    No friction rub. No gallop.  Pulmonary:     Effort: Pulmonary effort is normal. No respiratory distress.     Breath sounds: Normal breath sounds. No stridor. No wheezing, rhonchi or rales.  Abdominal:     General: Bowel sounds are normal. There is no distension.     Palpations: Abdomen is soft. There is no mass.     Tenderness: There is no abdominal tenderness. There is no guarding or rebound.     Hernia: No hernia is present.   Musculoskeletal:        General: Normal range of motion.     Cervical back: Normal range of motion and neck supple. No rigidity.     Right lower leg: No edema.     Left lower leg: No edema.  Lymphadenopathy:     Cervical: No cervical adenopathy.  Neurological:     General: No focal deficit present.     Mental Status: He is alert and oriented to person, place, and time. Mental status is at baseline.     Cranial Nerves: No cranial nerve deficit.     Sensory: No sensory deficit.     Motor: No weakness.     Coordination: Coordination normal.     Gait: Gait normal.     Deep Tendon Reflexes: Reflexes normal.  Psychiatric:        Mood and Affect: Mood normal.        Behavior: Behavior normal.        Thought Content: Thought content normal.        Judgment: Judgment normal.           Assessment & Plan:   Benign essential HTN - Plan: Lipid panel  General medical exam  Chronic gout without tophus, unspecified cause, unspecified site - Plan: Uric acid  Situational stress  Alpha-gal syndrome - Plan: Alpha-Gal Panel Recent blood work is outstanding.  His blood pressure today is excellent.  His immunizations are up-to-date except for the COVID shot which he politely declined.  I will check a uric acid level to ensure that his allopurinol  is at the correct dose.  Patient also request an alpha gal panel to determine if it is safe for him to resume consumption of beef and pork.  We will also check a lipid panel.  We will increase Klonopin  to 1 mg at night to help him sleep.  Patient has been doing this on his own and has tolerated the higher dose without difficulty.  I explained to him why we want to avoid stronger medication due to the fall risk.  I recommended trying Lexapro 10 mg a day for his situational stress which I believe is to lead to clinical depression.  Patient is willing to try this and reassess in 6 weeks.

## 2024-07-19 ENCOUNTER — Ambulatory Visit: Payer: Self-pay | Admitting: Family Medicine

## 2024-07-23 LAB — LIPID PANEL
Cholesterol: 168 mg/dL (ref <200)
HDL: 37 mg/dL — ABNORMAL LOW (ref >=40)
Non-HDL Cholesterol (Calc): 131 mg/dL — ABNORMAL HIGH (ref <130)
Total CHOL/HDL Ratio: 4.5 (calc) (ref <5.0)
Triglycerides: 426 mg/dL — ABNORMAL HIGH (ref <150)

## 2024-07-23 LAB — ALPHA-GAL PANEL
Allergen, Mutton, f88: <0.1 kU/L
Allergen, Pork, f26: <0.1 kU/L
Beef: <0.1 kU/L
CLASS: 0
CLASS: 0
Class: 0
GALACTOSE-ALPHA-1,3-GALACTOSE IGE*: 0.12 kU/L — ABNORMAL HIGH (ref <0.10)

## 2024-07-23 LAB — URIC ACID: Uric Acid, Serum: 3.8 mg/dL — ABNORMAL LOW (ref 4.0–8.0)

## 2024-07-23 LAB — INTERPRETATION:

## 2024-07-24 ENCOUNTER — Other Ambulatory Visit (HOSPITAL_COMMUNITY): Payer: Self-pay

## 2024-07-27 ENCOUNTER — Other Ambulatory Visit (HOSPITAL_COMMUNITY): Payer: Self-pay
